# Patient Record
Sex: Female | Born: 1965 | Race: White | Hispanic: No | Smoking: Current every day smoker
Health system: Southern US, Community
[De-identification: ages and names within clinical notes are randomized; demographics above are authoritative.]

## PROBLEM LIST (undated history)

## (undated) DIAGNOSIS — I2510 Atherosclerotic heart disease of native coronary artery without angina pectoris: Secondary | ICD-10-CM

## (undated) DIAGNOSIS — G8929 Other chronic pain: Secondary | ICD-10-CM

## (undated) DIAGNOSIS — J449 Chronic obstructive pulmonary disease, unspecified: Secondary | ICD-10-CM

## (undated) DIAGNOSIS — G4730 Sleep apnea, unspecified: Secondary | ICD-10-CM

## (undated) DIAGNOSIS — E119 Type 2 diabetes mellitus without complications: Secondary | ICD-10-CM

## (undated) DIAGNOSIS — T7840XA Allergy, unspecified, initial encounter: Secondary | ICD-10-CM

## (undated) DIAGNOSIS — H269 Unspecified cataract: Secondary | ICD-10-CM

## (undated) HISTORY — DX: Atherosclerotic heart disease of native coronary artery without angina pectoris: I25.10

## (undated) HISTORY — DX: Unspecified cataract: H26.9

## (undated) HISTORY — DX: Sleep apnea, unspecified: G47.30

## (undated) HISTORY — PX: DG GALL BLADDER: HXRAD326

## (undated) HISTORY — DX: Allergy, unspecified, initial encounter: T78.40XA

## (undated) HISTORY — DX: Other chronic pain: G89.29

## (undated) HISTORY — DX: Chronic obstructive pulmonary disease, unspecified: J44.9

## (undated) HISTORY — DX: Type 2 diabetes mellitus without complications: E11.9

## (undated) HISTORY — PX: CHOLECYSTECTOMY: SHX55

## (undated) HISTORY — PX: TUBAL LIGATION: SHX77

---

## 2014-12-24 DIAGNOSIS — R5383 Other fatigue: Secondary | ICD-10-CM

## 2014-12-24 DIAGNOSIS — R42 Dizziness and giddiness: Secondary | ICD-10-CM | POA: Insufficient documentation

## 2014-12-24 DIAGNOSIS — R079 Chest pain, unspecified: Secondary | ICD-10-CM | POA: Insufficient documentation

## 2014-12-24 HISTORY — DX: Other fatigue: R53.83

## 2017-08-09 DIAGNOSIS — N840 Polyp of corpus uteri: Secondary | ICD-10-CM

## 2017-08-09 DIAGNOSIS — T7840XA Allergy, unspecified, initial encounter: Secondary | ICD-10-CM | POA: Insufficient documentation

## 2017-08-09 HISTORY — DX: Polyp of corpus uteri: N84.0

## 2018-06-17 DIAGNOSIS — F17200 Nicotine dependence, unspecified, uncomplicated: Secondary | ICD-10-CM | POA: Insufficient documentation

## 2021-10-05 DIAGNOSIS — Z8349 Family history of other endocrine, nutritional and metabolic diseases: Secondary | ICD-10-CM | POA: Insufficient documentation

## 2021-10-05 DIAGNOSIS — R221 Localized swelling, mass and lump, neck: Secondary | ICD-10-CM | POA: Insufficient documentation

## 2021-10-05 HISTORY — DX: Localized swelling, mass and lump, neck: R22.1

## 2021-10-05 HISTORY — DX: Family history of other endocrine, nutritional and metabolic diseases: Z83.49

## 2021-11-10 ENCOUNTER — Encounter: Payer: Self-pay | Admitting: Family Medicine

## 2021-11-10 ENCOUNTER — Ambulatory Visit (INDEPENDENT_AMBULATORY_CARE_PROVIDER_SITE_OTHER): Payer: Commercial Managed Care - HMO | Admitting: Family Medicine

## 2021-11-10 VITALS — BP 136/79 | HR 85 | Temp 97.5°F | Ht 64.0 in | Wt 282.8 lb

## 2021-11-10 DIAGNOSIS — R600 Localized edema: Secondary | ICD-10-CM

## 2021-11-10 DIAGNOSIS — R221 Localized swelling, mass and lump, neck: Secondary | ICD-10-CM

## 2021-11-10 DIAGNOSIS — Z720 Tobacco use: Secondary | ICD-10-CM | POA: Insufficient documentation

## 2021-11-10 DIAGNOSIS — J329 Chronic sinusitis, unspecified: Secondary | ICD-10-CM

## 2021-11-10 DIAGNOSIS — Z8349 Family history of other endocrine, nutritional and metabolic diseases: Secondary | ICD-10-CM

## 2021-11-10 HISTORY — DX: Chronic sinusitis, unspecified: J32.9

## 2021-11-10 HISTORY — DX: Morbid (severe) obesity due to excess calories: E66.01

## 2021-11-10 HISTORY — DX: Tobacco use: Z72.0

## 2021-11-10 HISTORY — DX: Localized edema: R60.0

## 2021-11-10 MED ORDER — IPRATROPIUM BROMIDE 0.06 % NA SOLN: 2.0000 | Freq: Two times a day (BID) | NASAL | 2 refills | Status: DC

## 2021-11-10 NOTE — Assessment & Plan Note (Addendum)
Multiple rounds of antibiotics ?Increased risk based on smoking ?Counseled on smoking cessation ?Ipratropium refilled ?For ENT ?

## 2021-11-10 NOTE — Assessment & Plan Note (Signed)
Recommend cessation and counseled on harms of smoking ?

## 2021-11-10 NOTE — Patient Instructions (Addendum)
If you develop chest pain, or severe shortness of breath, go ED ?

## 2021-11-10 NOTE — Assessment & Plan Note (Signed)
Persistent despite multiple imaging was negative for malignant process as explained above ?Some intermittent odynophagia ?History of smoking, increases risk for head and neck cancers ?Referral to ENT to assess further ?

## 2021-11-10 NOTE — Assessment & Plan Note (Signed)
Counseled on diet and exercise ?Recommend restratification labs at follow-up in 2 weeks including CMP, lipid, hemoglobin A1c ?

## 2021-11-10 NOTE — Assessment & Plan Note (Signed)
Intermittent ?No chest pain or shortness of breath ?Sounds like possible VSI with dependent edema ?Although less likely, however cannot rule out further causes of heart failure, bilateral DVT although Wells score low, kidney disease, liver disease ?Possibly worse due to obesity and smoking ?Needs full work-up that we cannot get to today including CBC, CMP, urinalysis, chest x-ray, EKG, lower extremity Dopplers, can also consider echocardiogram ?Return and ED precautions discussed ?

## 2021-11-10 NOTE — Assessment & Plan Note (Signed)
Recent TSH within normal limits and neck ultrasound showed only benign thyroid nodule not likely to cause thyroid disease ? ?

## 2021-11-10 NOTE — Progress Notes (Addendum)
? ?New Patient Office Visit ? ?Subjective   ? ?Patient ID: Theresa Marquez, female    DOB: [birth year]  Age: 56 y.o. MRN: [id number] ? ?CC:  ?Chief Complaint  ?Patient presents with  ? Establish Care  ?  Np est care pt c/o massive swelling everywhere  ? ? ?HPI ?Theresa Marquez presents to establish care.  ? ?Recurrent sinusitis and ear infections. Past 3-4 years. Has tried Augmentin and saw outside provider 09/2021, got doxycyline. No improvennt after finish the antiboitics. No improvement w/ flonase, improved with ipratorpium  ? ?Felt a left neck nodule for past 6 months, 09/2020, difficult to swallow w/ pain. had neck ultrasound that showed benign thyroid nodule, TSH was wnl, Had maxofacial CT 2021, that did not show any nasal or oropharyngeal abnormalities. CT head/neck in 2019, pharyngitis but no other neck abnormalities.  ? ?When falling asleep, has facial tick, ongoing for several years. Denies any tremors or difficulty walking or falls.  ? ?Intermittent lower extremity swelling for 5 years, mostly in legs if on her feet, intermittant, can happen every 2 weeks to a month,  improved when off her feet, reports worse w/ weight loss over this time, No chest pain or shortness of breath, associated with leg pain with the edema,  ? ?Smoking, 0.5-1 pack for 30+ years,  ? ? ?Outpatient Encounter Medications as of 11/10/2021  ?Medication Sig  ? cetirizine (ZYRTEC) 10 MG tablet Take by mouth.  ? [DISCONTINUED] ipratropium (ATROVENT) 0.06 % nasal spray Place 2 sprays into both nostrils 2 (two) times daily.  ? ipratropium (ATROVENT) 0.06 % nasal spray Place 2 sprays into both nostrils 2 (two) times daily.  ? ?No facility-administered encounter medications on file as of 11/10/2021.  ? ? ?Past Medical History:  ?Diagnosis Date  ? Allergy   ? Endometrial polyp 08/09/2017  ? Formatting of this note might be different from the original. S/p hysteroscopy with [facility] 10/2017 with Dr Doreene Nest, was benign  ? ? ?Past Surgical History:   ?Procedure Laterality Date  ? DG GALL BLADDER Right   ? ? ?Family History  ?Problem Relation Age of Onset  ? Kidney disease Mother   ? Cancer Father   ? Cerebral palsy Daughter   ? ? ?Social History  ? ?Socioeconomic History  ? Marital status: Divorced  ?  Spouse name: Not on file  ? Number of children: Not on file  ? Years of education: Not on file  ? Highest education level: Not on file  ?Occupational History  ? Not on file  ?Tobacco Use  ? Smoking status: Every Day  ?  Packs/day: 1.00  ?  Years: 30.00  ?  Pack years: 30.00  ?  Types: Cigarettes  ?  Start date: 10/26/1969  ? Smokeless tobacco: Never  ?Vaping Use  ? Vaping Use: Never used  ?Substance and Sexual Activity  ? Alcohol use: Not Currently  ? Drug use: Never  ? Sexual activity: Not Currently  ?Other Topics Concern  ? Not on file  ?Social History Narrative  ? Not on file  ? ?Social Determinants of Health  ? ?Financial Resource Strain: Not on file  ?Food Insecurity: Not on file  ?Transportation Needs: Not on file  ?Physical Activity: Not on file  ?Stress: Not on file  ?Social Connections: Not on file  ?Intimate Partner Violence: Not on file  ? ? ?ROS ?As per HPI ?  ? ? ?Objective   ? ?BP 136/79 (BP Location: Left Arm, Patient Position:  Sitting, Cuff Size: Large)   Pulse 85   Temp (!) 97.5 ?F (36.4 ?C) (Temporal)   Ht 5\' 4"  (1.626 m)   Wt 282 lb 12.8 oz (128.3 kg)   SpO2 91%   BMI 48.54 kg/m?  ? ?Gen: NAD, resting comfortably ?HEENT: TM bilateral normal, normal TMJ, no neck masses palpated, normal oropharyngeal pharynx, MMM ?CV: RRR with no murmurs appreciated ?Pulm: NWOB, CTAB with no crackles, wheezes, or rhonchi ?GI: Normal bowel sounds present. Soft, Nontender, Nondistended. ?MSK: Trace to +1 bilaterally to mid shin edema,  ?Skin: warm, dry ?Neuro: grossly normal, moves all extremities ?Psych: Normal affect and thought content ? ? ? ?  ? ?Assessment & Plan:  ? ?Problem List Items Addressed This Visit   ? ?  ? Respiratory  ? Recurrent sinus  infections - Primary  ?  Multiple rounds of antibiotics ?Increased risk based on smoking ?Counseled on smoking cessation ?Ipratropium refilled ?For ENT ? ?  ?  ? Relevant Medications  ? cetirizine (ZYRTEC) 10 MG tablet  ? ipratropium (ATROVENT) 0.06 % nasal spray  ? Other Relevant Orders  ? Ambulatory referral to ENT  ?  ? Other  ? Family history of thyroid disease  ?  Recent TSH within normal limits and neck ultrasound showed only benign thyroid nodule not likely to cause thyroid disease ? ? ?  ?  ? Neck swelling  ?  Persistent despite multiple imaging was negative for malignant process as explained above ?Some intermittent odynophagia ?History of smoking, increases risk for head and neck cancers ?Referral to ENT to assess further ? ?  ?  ? Relevant Orders  ? Ambulatory referral to ENT  ? Obesity, morbid, BMI 40.0-49.9 ([place])  ?  Counseled on diet and exercise ?Recommend restratification labs at follow-up in 2 weeks including CMP, lipid, hemoglobin A1c ? ?  ?  ? Tobacco abuse  ?  Recommend cessation and counseled on harms of smoking ? ?  ?  ? Bilateral lower extremity edema  ?  Intermittent ?No chest pain or shortness of breath ?Sounds like possible VSI with dependent edema ?Although less likely, however cannot rule out further causes of heart failure, bilateral DVT although Wells score low, kidney disease, liver disease ?Possibly worse due to obesity and smoking ?Needs full work-up that we cannot get to today including CBC, CMP, urinalysis, chest x-ray, EKG, lower extremity Dopplers, can also consider echocardiogram ?Return and ED precautions discussed ? ?  ?  ? ? ?Return in about 2 weeks (around 11/24/2021) for swelling .  ? ?Bonnita Hollow, MD ? ? ?

## 2021-11-30 ENCOUNTER — Ambulatory Visit (INDEPENDENT_AMBULATORY_CARE_PROVIDER_SITE_OTHER): Payer: Commercial Managed Care - HMO

## 2021-11-30 ENCOUNTER — Encounter: Payer: Self-pay | Admitting: Family Medicine

## 2021-11-30 ENCOUNTER — Ambulatory Visit (INDEPENDENT_AMBULATORY_CARE_PROVIDER_SITE_OTHER): Payer: Commercial Managed Care - HMO | Admitting: Family Medicine

## 2021-11-30 ENCOUNTER — Telehealth: Payer: Self-pay | Admitting: Family Medicine

## 2021-11-30 VITALS — BP 129/74 | HR 81 | Temp 97.2°F | Ht 64.0 in | Wt 280.6 lb

## 2021-11-30 DIAGNOSIS — R600 Localized edema: Secondary | ICD-10-CM

## 2021-11-30 DIAGNOSIS — J329 Chronic sinusitis, unspecified: Secondary | ICD-10-CM | POA: Diagnosis not present

## 2021-11-30 DIAGNOSIS — J309 Allergic rhinitis, unspecified: Secondary | ICD-10-CM | POA: Diagnosis not present

## 2021-11-30 DIAGNOSIS — J310 Chronic rhinitis: Secondary | ICD-10-CM

## 2021-11-30 DIAGNOSIS — Z23 Encounter for immunization: Secondary | ICD-10-CM | POA: Diagnosis not present

## 2021-11-30 DIAGNOSIS — R0600 Dyspnea, unspecified: Secondary | ICD-10-CM | POA: Diagnosis not present

## 2021-11-30 HISTORY — DX: Chronic rhinitis: J31.0

## 2021-11-30 LAB — COMPREHENSIVE METABOLIC PANEL
ALT: 15 U/L (ref 0–35)
AST: 12 U/L (ref 0–37)
Albumin: 3.5 g/dL (ref 3.5–5.2)
Alkaline Phosphatase: 91 U/L (ref 39–117)
BUN: 8 mg/dL (ref 6–23)
CO2: 29 mEq/L (ref 19–32)
Calcium: 9.2 mg/dL (ref 8.4–10.5)
Chloride: 104 mEq/L (ref 96–112)
Creatinine, Ser: 0.72 mg/dL (ref 0.40–1.20)
GFR: 93.55 mL/min (ref >60.00)
Glucose, Bld: 89 mg/dL (ref 70–99)
Potassium: 3.8 mEq/L (ref 3.5–5.1)
Sodium: 139 mEq/L (ref 135–145)
Total Bilirubin: 0.4 mg/dL (ref 0.2–1.2)
Total Protein: 7.1 g/dL (ref 6.0–8.3)

## 2021-11-30 LAB — CBC WITH DIFFERENTIAL/PLATELET
Basophils Absolute: 0 10*3/uL (ref 0.0–0.1)
Basophils Relative: 0.2 % (ref 0.0–3.0)
Eosinophils Absolute: 0.2 10*3/uL (ref 0.0–0.7)
Eosinophils Relative: 1.5 % (ref 0.0–5.0)
HCT: 41.4 % (ref 36.0–46.0)
Hemoglobin: 13.3 g/dL (ref 12.0–15.0)
Lymphocytes Relative: 22.7 % (ref 12.0–46.0)
Lymphs Abs: 2.5 10*3/uL (ref 0.7–4.0)
MCHC: 32.1 g/dL (ref 30.0–36.0)
MCV: 89.8 fl (ref 78.0–100.0)
Monocytes Absolute: 0.6 10*3/uL (ref 0.1–1.0)
Monocytes Relative: 5.5 % (ref 3.0–12.0)
Neutro Abs: 7.8 10*3/uL — ABNORMAL HIGH (ref 1.4–7.7)
Neutrophils Relative %: 70.1 % (ref 43.0–77.0)
Platelets: 312 10*3/uL (ref 150.0–400.0)
RBC: 4.61 Mil/uL (ref 3.87–5.11)
RDW: 15 % (ref 11.5–15.5)
WBC: 11.1 10*3/uL — ABNORMAL HIGH (ref 4.0–10.5)

## 2021-11-30 LAB — POCT URINALYSIS DIPSTICK
Bilirubin, UA: NEGATIVE
Blood, UA: NEGATIVE
Glucose, UA: NEGATIVE
Ketones, UA: NEGATIVE
Leukocytes, UA: NEGATIVE
Nitrite, UA: NEGATIVE
Protein, UA: NEGATIVE
Spec Grav, UA: 1.015 (ref 1.010–1.025)
Urobilinogen, UA: 0.2 E.U./dL
pH, UA: 6 (ref 5.0–8.0)

## 2021-11-30 MED ORDER — IPRATROPIUM BROMIDE 0.06 % NA SOLN: 2.0000 | Freq: Two times a day (BID) | NASAL | 2 refills | Status: DC

## 2021-11-30 NOTE — Assessment & Plan Note (Signed)
Chronic neck and is controlled on ipratropium Refill

## 2021-11-30 NOTE — Progress Notes (Signed)
   Theresa Marquez is a 56 y.o. female who presents today for an office visit.  Assessment/Plan:  New/Acute Problems: none  Chronic Problems Addressed Today: Bilateral lower extremity edema Lower extremity edema Associated with dyspnea and crackles on lung exam Broad differential including anemia, electrolyte disorders, protein balance, heart failure, Wells score 0, PE less likely Normal TSH 1 month ago However I do believe it is most likely related to obesity and smoking Return precautions discussed and go to ED if develop severe chest pain or shortness of breath     Subjective:  HPI:  Patient reports with 5-year history of intermittent lower extremity swelling, has been worse over the past few months.  It is worse when standing on her legs for a long time.  Patient also endorses some shortness of breath, however this is associated with globus pharyngeus.  Denies any difficulty swallowing.  Endorses history of chronic rhinitis, improved on ipratropium.  Patient does have significant tobacco use.       Objective:  Physical Exam: BP 129/74 (BP Location: Right Arm, Patient Position: Sitting, Cuff Size: Large)   Pulse 81   Temp (!) 97.2 F (36.2 C) (Temporal)   Ht 5\' 4"  (1.626 m)   Wt 280 lb 9.6 oz (127.3 kg)   SpO2 96%   BMI 48.16 kg/m   Gen: No acute distress, resting comfortably CV: Regular rate and rhythm with no murmurs appreciated Pulm: Normal work of breathing, right lower crackles in lungs MSK: 2+ pitting edema bilaterally in lower extremities up to shin Abdomen: Nontender, nondistended Neuro: Grossly normal, moves all extremities Psych: Normal affect and thought content      , MD, MS

## 2021-11-30 NOTE — Telephone Encounter (Signed)
Para March from [facility] on [facility] [address] phone # [phone] is needing to know if pt's VAS [place] LOWER EXTREMITY VENOUS (DVT) (Order [id number]) is needing a PA.

## 2021-11-30 NOTE — Assessment & Plan Note (Signed)
Lower extremity edema Associated with dyspnea and crackles on lung exam Broad differential including anemia, electrolyte disorders, protein balance, heart failure, Wells score 0, PE less likely Normal TSH 1 month ago However I do believe it is most likely related to obesity and smoking Return precautions discussed and go to ED if develop severe chest pain or shortness of breath

## 2021-12-01 ENCOUNTER — Other Ambulatory Visit: Payer: Self-pay

## 2021-12-01 ENCOUNTER — Encounter (HOSPITAL_COMMUNITY): Payer: Self-pay | Admitting: Emergency Medicine

## 2021-12-01 ENCOUNTER — Inpatient Hospital Stay (HOSPITAL_COMMUNITY)
Admission: EM | Admit: 2021-12-01 | Discharge: 2021-12-07 | DRG: 292 | Disposition: A | Discharge status: Home / Self Care | Payer: Commercial Managed Care - HMO | Attending: Internal Medicine | Admitting: Internal Medicine

## 2021-12-01 ENCOUNTER — Emergency Department (HOSPITAL_COMMUNITY): Payer: Commercial Managed Care - HMO

## 2021-12-01 ENCOUNTER — Ambulatory Visit (HOSPITAL_BASED_OUTPATIENT_CLINIC_OR_DEPARTMENT_OTHER)
Admission: RE | Admit: 2021-12-01 | Discharge: 2021-12-01 | Disposition: A | Discharge status: Home / Self Care | Payer: Commercial Managed Care - HMO | Source: Ambulatory Visit | Attending: Family Medicine | Admitting: Family Medicine

## 2021-12-01 DIAGNOSIS — R0609 Other forms of dyspnea: Principal | ICD-10-CM

## 2021-12-01 DIAGNOSIS — Z841 Family history of disorders of kidney and ureter: Secondary | ICD-10-CM

## 2021-12-01 DIAGNOSIS — R600 Localized edema: Secondary | ICD-10-CM | POA: Diagnosis not present

## 2021-12-01 DIAGNOSIS — D72829 Elevated white blood cell count, unspecified: Secondary | ICD-10-CM | POA: Diagnosis present

## 2021-12-01 DIAGNOSIS — E8809 Other disorders of plasma-protein metabolism, not elsewhere classified: Secondary | ICD-10-CM | POA: Diagnosis present

## 2021-12-01 DIAGNOSIS — F17210 Nicotine dependence, cigarettes, uncomplicated: Secondary | ICD-10-CM | POA: Diagnosis present

## 2021-12-01 DIAGNOSIS — I509 Heart failure, unspecified: Secondary | ICD-10-CM

## 2021-12-01 DIAGNOSIS — R0600 Dyspnea, unspecified: Secondary | ICD-10-CM | POA: Insufficient documentation

## 2021-12-01 DIAGNOSIS — Z6841 Body Mass Index (BMI) 40.0 and over, adult: Secondary | ICD-10-CM

## 2021-12-01 DIAGNOSIS — I5031 Acute diastolic (congestive) heart failure: Principal | ICD-10-CM | POA: Diagnosis present

## 2021-12-01 DIAGNOSIS — Z809 Family history of malignant neoplasm, unspecified: Secondary | ICD-10-CM

## 2021-12-01 DIAGNOSIS — R739 Hyperglycemia, unspecified: Secondary | ICD-10-CM | POA: Diagnosis present

## 2021-12-01 DIAGNOSIS — Z79899 Other long term (current) drug therapy: Secondary | ICD-10-CM

## 2021-12-01 DIAGNOSIS — J449 Chronic obstructive pulmonary disease, unspecified: Secondary | ICD-10-CM | POA: Diagnosis present

## 2021-12-01 DIAGNOSIS — R7303 Prediabetes: Secondary | ICD-10-CM | POA: Diagnosis present

## 2021-12-01 LAB — CBC WITH DIFFERENTIAL/PLATELET
Abs Immature Granulocytes: 0.04 10*3/uL (ref 0.00–0.07)
Basophils Absolute: 0 10*3/uL (ref 0.0–0.1)
Basophils Relative: 0 %
Eosinophils Absolute: 0.3 10*3/uL (ref 0.0–0.5)
Eosinophils Relative: 2 %
HCT: 44.8 % (ref 36.0–46.0)
Hemoglobin: 13.7 g/dL (ref 12.0–15.0)
Immature Granulocytes: 0 %
Lymphocytes Relative: 22 %
Lymphs Abs: 2.8 10*3/uL (ref 0.7–4.0)
MCH: 29.3 pg (ref 26.0–34.0)
MCHC: 30.6 g/dL (ref 30.0–36.0)
MCV: 95.7 fL (ref 80.0–100.0)
Monocytes Absolute: 0.7 10*3/uL (ref 0.1–1.0)
Monocytes Relative: 6 %
Neutro Abs: 8.9 10*3/uL — ABNORMAL HIGH (ref 1.7–7.7)
Neutrophils Relative %: 70 %
Platelets: 333 10*3/uL (ref 150–400)
RBC: 4.68 MIL/uL (ref 3.87–5.11)
RDW: 14.1 % (ref 11.5–15.5)
WBC: 12.8 10*3/uL — ABNORMAL HIGH (ref 4.0–10.5)
nRBC: 0 % (ref 0.0–0.2)

## 2021-12-01 LAB — COMPREHENSIVE METABOLIC PANEL
ALT: 17 U/L (ref 0–44)
AST: 15 U/L (ref 15–41)
Albumin: 3.1 g/dL — ABNORMAL LOW (ref 3.5–5.0)
Alkaline Phosphatase: 89 U/L (ref 38–126)
Anion gap: 9 (ref 5–15)
BUN: 9 mg/dL (ref 6–20)
CO2: 27 mmol/L (ref 22–32)
Calcium: 9.1 mg/dL (ref 8.9–10.3)
Chloride: 106 mmol/L (ref 98–111)
Creatinine, Ser: 0.84 mg/dL (ref 0.44–1.00)
GFR, Estimated: >60 mL/min (ref >60)
Glucose, Bld: 136 mg/dL — ABNORMAL HIGH (ref 70–99)
Potassium: 3.9 mmol/L (ref 3.5–5.1)
Sodium: 142 mmol/L (ref 135–145)
Total Bilirubin: 0.3 mg/dL (ref 0.3–1.2)
Total Protein: 6.8 g/dL (ref 6.5–8.1)

## 2021-12-01 LAB — BRAIN NATRIURETIC PEPTIDE: B Natriuretic Peptide: 33.3 pg/mL (ref 0.0–100.0)

## 2021-12-01 LAB — TROPONIN I (HIGH SENSITIVITY): Troponin I (High Sensitivity): 6 ng/L (ref <18)

## 2021-12-01 MED ORDER — FUROSEMIDE 10 MG/ML IJ SOLN
20.0000 mg | Freq: Once | INTRAMUSCULAR | Status: AC
  Administered 2021-12-02: 20 mg via INTRAVENOUS
  Filled 2021-12-01: qty 2

## 2021-12-01 MED ORDER — FUROSEMIDE 10 MG/ML IJ SOLN: 40.0000 mg | Freq: Once | INTRAMUSCULAR | Status: DC

## 2021-12-01 NOTE — ED Triage Notes (Signed)
Patient reports worsening SOB and chest tightness today , advised by her PCP to go to ER for evaluation , no cough or fever , presents with bilateral lower legs edema.

## 2021-12-01 NOTE — ED Provider Notes (Signed)
[facility] EMERGENCY DEPARTMENT Provider Note   CSN: [id number] Arrival date & time: 12/01/21  2038     History {Add pertinent medical, surgical, social history, OB history to HPI:1} Chief Complaint  Patient presents with   SOB / Chest Tightness    Theresa Marquez is a 56 y.o. female.  56 year old female without any known past medical history but a approximately 40-pack-year smoking history presents to the ER today with multiple complaints.  They all seem to be related started about a couple weeks ago she started having some nonproductive cough.  No fevers.  She started having dyspnea on exertion around that time as well.  States that this progressively worsened to where start having shortness of breath even at rest and laying down.  She saw her doctor who recommended a DVT ultrasound which was negative and an echocardiogram which has not been done yet.  Chest x-ray done and reportedly showed fluid in her lungs and he heard crackles as well.  She had reported oxygen saturation of 89% in the office.  Reviewed the records it does appear that she was normotensive but the rest of her vitals were unremarkable as well.  This may have been after it was rechecked and is unclear.  Either way, she was told if her shortness of breath got worse start having chest pain to come to the ER.  She states tonight after doing her normal activities started have some chest tightness which is slowly worsened.  States that it is central does not really radiate.  Her breathing seems to be low but worse as well.  Her lower extremity edema has improved reportedly.       Home Medications Prior to Admission medications   Medication Sig Start Date End Date Taking? Authorizing Provider  cetirizine (ZYRTEC) 10 MG tablet Take by mouth. 10/05/21   [provider]  ipratropium (ATROVENT) 0.06 % nasal spray Place 2 sprays into both nostrils 2 (two) times daily. 11/30/21 02/28/22  Garnette Gunner, MD       Allergies    Codeine    Review of Systems   Review of Systems  Physical Exam Updated Vital Signs BP (!) 170/95 (BP Location: Right Arm)   Pulse 92   Temp 98.4 F (36.9 C) (Oral)   Resp 20   SpO2 96%  Physical Exam Vitals and nursing note reviewed.  Constitutional:      Appearance: She is well-developed.  HENT:     Head: Normocephalic and atraumatic.     Nose: No congestion or rhinorrhea.     Mouth/Throat:     Mouth: Mucous membranes are dry.  Eyes:     Pupils: Pupils are equal, round, and reactive to light.  Cardiovascular:     Rate and Rhythm: Normal rate and regular rhythm.  Pulmonary:     Effort: No respiratory distress.     Breath sounds: No stridor. Wheezing and rales present.  Abdominal:     General: Abdomen is flat. There is no distension.  Musculoskeletal:        General: Swelling present. Normal range of motion.     Cervical back: Normal range of motion.  Skin:    General: Skin is warm and dry.  Neurological:     General: No focal deficit present.     Mental Status: She is alert.    ED Results / Procedures / Treatments   Labs (all labs ordered are listed, but only abnormal results are displayed) Labs  Reviewed  CBC WITH DIFFERENTIAL/PLATELET - Abnormal; Notable for the following components:      Result Value   WBC 12.8 (*)    Neutro Abs 8.9 (*)    All other components within normal limits  COMPREHENSIVE METABOLIC PANEL - Abnormal; Notable for the following components:   Glucose, Bld 136 (*)    Albumin 3.1 (*)    All other components within normal limits  BRAIN NATRIURETIC PEPTIDE  TROPONIN I (HIGH SENSITIVITY)  TROPONIN I (HIGH SENSITIVITY)    EKG None  Radiology DG Chest 2 View  Result Date: 12/01/2021 CLINICAL DATA:  Shortness of breath. EXAM: CHEST - 2 VIEW COMPARISON:  11/30/2021. FINDINGS: Heart is enlarged and the mediastinal contour is stable. The pulmonary vasculature is mildly distended. Interstitial prominence is noted  bilaterally. No consolidation, effusion, or pneumothorax. Mild degenerative changes are present in the thoracic spine. IMPRESSION: 1. Cardiomegaly with pulmonary vascular congestion. 2. Interstitial prominence bilaterally possible edema or infiltrate. Electronically Signed   By: Thornell Sartorius M.D.   On: 12/01/2021 21:31   DG Chest 2 View  Result Date: 12/01/2021 CLINICAL DATA:  Dyspnea. Hypoxia. Lower extremity edema. EXAM: CHEST - 2 VIEW COMPARISON:  03/20/2015 FINDINGS: Mild cardiomegaly appears increased since previous study. Increased pulmonary vascular congestion is also seen, without evidence of frank pulmonary edema. No evidence of pulmonary consolidation or pleural effusion. IMPRESSION: Increased cardiomegaly and pulmonary vascular congestion. No focal consolidation or pleural effusion. Electronically Signed   By: Danae Orleans M.D.   On: 12/01/2021 08:12   VAS [place] LOWER EXTREMITY VENOUS (DVT)  Result Date: 12/01/2021  Lower Venous DVT Study Patient Name:  Theresa Marquez  Date of Exam:   12/01/2021 Medical Rec #: [id number]        Accession #:    [id number] Date of Birth: 10-04-1965        Patient Gender: F Patient Age:   65 years Exam Location:  [facility] Procedure:      VAS [place] LOWER EXTREMITY VENOUS (DVT) Referring Phys: Fanny Bien --------------------------------------------------------------------------------  Indications: History of lower extremity swelling for several months. Bilateral lower extremity persistent edema and redness x 2 weeks. Patient c/o dyspnea on exertion and with coughing x 2 weeks. Patient reports recent diagnosis of an enlarged heart.  Comparison Study: NA Performing Technologist: Tyna Jaksch RVT  Examination Guidelines: A complete evaluation includes B-mode imaging, spectral Doppler, color Doppler, and power Doppler as needed of all accessible portions of each vessel. Bilateral testing is considered an integral part of a complete examination. Limited examinations for  reoccurring indications may be performed as noted. The reflux portion of the exam is performed with the patient in reverse Trendelenburg.  +---------+---------------+---------+-----------+----------+--------------+ RIGHT    CompressibilityPhasicitySpontaneityPropertiesThrombus Aging +---------+---------------+---------+-----------+----------+--------------+ CFV      Full                    Yes                                 +---------+---------------+---------+-----------+----------+--------------+ SFJ      Full                    Yes                                 +---------+---------------+---------+-----------+----------+--------------+ FV Prox  Full  Yes                                 +---------+---------------+---------+-----------+----------+--------------+ FV Mid   Full                                                        +---------+---------------+---------+-----------+----------+--------------+ FV DistalFull                    Yes                                 +---------+---------------+---------+-----------+----------+--------------+ PFV      Full                    Yes                                 +---------+---------------+---------+-----------+----------+--------------+ POP      Full                    Yes                                 +---------+---------------+---------+-----------+----------+--------------+ PTV      Full                                                        +---------+---------------+---------+-----------+----------+--------------+ PERO     Full                                                        +---------+---------------+---------+-----------+----------+--------------+ Gastroc  Full                                                        +---------+---------------+---------+-----------+----------+--------------+ GSV      Full                    Yes                                  +---------+---------------+---------+-----------+----------+--------------+   Right Technical Findings: Mild pulsatile venous flow noted throughout the lower extremity.  +---------+---------------+---------+-----------+----------+--------------+ LEFT     CompressibilityPhasicitySpontaneityPropertiesThrombus Aging +---------+---------------+---------+-----------+----------+--------------+ CFV      Full                    Yes                                 +---------+---------------+---------+-----------+----------+--------------+ SFJ  Full                    Yes                                 +---------+---------------+---------+-----------+----------+--------------+ FV Prox  Full                    Yes                                 +---------+---------------+---------+-----------+----------+--------------+ FV Mid   Full                                                        +---------+---------------+---------+-----------+----------+--------------+ FV DistalFull                    Yes                                 +---------+---------------+---------+-----------+----------+--------------+ PFV      Full                    Yes                                 +---------+---------------+---------+-----------+----------+--------------+ POP      Full                    Yes                                 +---------+---------------+---------+-----------+----------+--------------+ PTV      Full                                                        +---------+---------------+---------+-----------+----------+--------------+ PERO     Full                                                        +---------+---------------+---------+-----------+----------+--------------+ Gastroc  Full                                                        +---------+---------------+---------+-----------+----------+--------------+ GSV      Full                     Yes                                 +---------+---------------+---------+-----------+----------+--------------+   Left Technical Findings: Mild pulsatile venous  flow noted throughout the lower extremity.   Summary: BILATERAL: - No evidence of deep vein thrombosis seen in the lower extremities, bilaterally. - No evidence of superficial venous thrombosis in the lower extremities, bilaterally. -No evidence of popliteal cyst, bilaterally.  - Mild pulsatile venous flow noted throughout the bilateral lower extremities. Pulsatile lower limb venous Doppler waveform correlates well with increase right atrium pressure; right-sided heart failure.  *See table(s) above for measurements and observations. Electronically signed by Charlton Haws MD on 12/01/2021 at 3:09:09 PM.    Final     Procedures Procedures  {Document cardiac monitor, telemetry assessment procedure when appropriate:1}  Medications Ordered in ED Medications  furosemide (LASIX) injection 20 mg (has no administration in time range)    ED Course/ Medical Decision Making/ A&P                           Medical Decision Making Risk Prescription drug management.  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  Mild hypoxia with [place] also tough to tell she might have COPD with a long smoking history.  She has new onset elevated blood pressure but no documented high blood pressure in the past and she states that she takes it often without blood pressure so is unclear what etiology of her heart failure might be.  She may have had a cardiac event couple weeks ago and this all started.  Could be myocarditis could be multiple other etiologies.  Will initiate Lasix.  Given nitro for blood pressure and chest pain.  We will see her check a troponin is and how she responds but will quite possibly need to be admitted to the hospital for further work-up in the setting of unclear etiology.. ***  {Document critical care time when  appropriate:1} {Document review of labs and clinical decision tools ie heart score, Chads2Vasc2 etc:1}  {Document your independent review of radiology images, and any outside records:1} {Document your discussion with family members, caretakers, and with consultants:1} {Document social determinants of health affecting pt's care:1} {Document your decision making why or why not admission, treatments were needed:1} Final Clinical Impression(s) / ED Diagnoses Final diagnoses:  None    Rx / DC Orders ED Discharge Orders     None

## 2021-12-01 NOTE — ED Provider Triage Note (Signed)
Emergency Medicine Provider Triage Evaluation Note  Theresa Marquez , a 56 y.o. female  was evaluated in triage.  Pt complains of shortness of breath.  Patient states that she has had bilateral lower extremity intermittent swelling over the past 5 months if she was on her feet too much during the day but has gotten worse recently.  Her lower extremity swelling is now persistent. She has had persistent shortness of breath with cough during that time.  This is worsened over the past week.  Shortness of breath is now with exertion as well as during coughing fits.  She also describes feelings of chest tightness that is worsened with excessive coughing.  She was recently establish care with a PCP who ordered a DVT study on her lower extremities which was negative today.  She denies fever, chills, night sweats, abdominal pain, nausea/vomiting, diarrhea, urinary/vaginal symptoms.  No history of heart failure but her PCP mention the possibility of heart failure yesterday.  Review of Systems  Positive: See above Negative:   Physical Exam  BP (!) 170/95 (BP Location: Right Arm)   Pulse 92   Temp 98.4 F (36.9 C) (Oral)   Resp 20   SpO2 96%  Gen:   Awake, no distress   Resp:  Normal effort  MSK:   Moves extremities without difficulty.  2+ pitting edema noted in bilateral lower extremities. Other:  Bilateral rails heard in middle lobe upon auscultation of lungs.  Medical Decision Making  Medically screening exam initiated at 9:02 PM.  Appropriate orders placed.  Theresa Marquez was informed that the remainder of the evaluation will be completed by another provider, this initial triage assessment does not replace that evaluation, and the importance of remaining in the ED until their evaluation is complete.     Peter Garter, [place] 12/01/21 2106

## 2021-12-02 ENCOUNTER — Encounter (HOSPITAL_COMMUNITY): Payer: Self-pay | Admitting: Internal Medicine

## 2021-12-02 ENCOUNTER — Observation Stay (HOSPITAL_COMMUNITY): Payer: Commercial Managed Care - HMO

## 2021-12-02 ENCOUNTER — Other Ambulatory Visit (HOSPITAL_COMMUNITY): Payer: Self-pay

## 2021-12-02 DIAGNOSIS — I509 Heart failure, unspecified: Secondary | ICD-10-CM | POA: Diagnosis not present

## 2021-12-02 DIAGNOSIS — R079 Chest pain, unspecified: Secondary | ICD-10-CM | POA: Diagnosis not present

## 2021-12-02 LAB — COMPREHENSIVE METABOLIC PANEL
ALT: 17 U/L (ref 0–44)
AST: 15 U/L (ref 15–41)
Albumin: 3.2 g/dL — ABNORMAL LOW (ref 3.5–5.0)
Alkaline Phosphatase: 86 U/L (ref 38–126)
Anion gap: 10 (ref 5–15)
BUN: 9 mg/dL (ref 6–20)
CO2: 26 mmol/L (ref 22–32)
Calcium: 8.8 mg/dL — ABNORMAL LOW (ref 8.9–10.3)
Chloride: 102 mmol/L (ref 98–111)
Creatinine, Ser: 0.72 mg/dL (ref 0.44–1.00)
GFR, Estimated: >60 mL/min (ref >60)
Glucose, Bld: 111 mg/dL — ABNORMAL HIGH (ref 70–99)
Potassium: 3.8 mmol/L (ref 3.5–5.1)
Sodium: 138 mmol/L (ref 135–145)
Total Bilirubin: 0.4 mg/dL (ref 0.3–1.2)
Total Protein: 6.9 g/dL (ref 6.5–8.1)

## 2021-12-02 LAB — CBC WITH DIFFERENTIAL/PLATELET
Abs Immature Granulocytes: 0.06 10*3/uL (ref 0.00–0.07)
Basophils Absolute: 0.1 10*3/uL (ref 0.0–0.1)
Basophils Relative: 0 %
Eosinophils Absolute: 0.3 10*3/uL (ref 0.0–0.5)
Eosinophils Relative: 3 %
HCT: 45.7 % (ref 36.0–46.0)
Hemoglobin: 14.5 g/dL (ref 12.0–15.0)
Immature Granulocytes: 1 %
Lymphocytes Relative: 18 %
Lymphs Abs: 2.2 10*3/uL (ref 0.7–4.0)
MCH: 29.8 pg (ref 26.0–34.0)
MCHC: 31.7 g/dL (ref 30.0–36.0)
MCV: 93.8 fL (ref 80.0–100.0)
Monocytes Absolute: 0.8 10*3/uL (ref 0.1–1.0)
Monocytes Relative: 7 %
Neutro Abs: 8.8 10*3/uL — ABNORMAL HIGH (ref 1.7–7.7)
Neutrophils Relative %: 71 %
Platelets: 304 10*3/uL (ref 150–400)
RBC: 4.87 MIL/uL (ref 3.87–5.11)
RDW: 14.3 % (ref 11.5–15.5)
WBC: 12.2 10*3/uL — ABNORMAL HIGH (ref 4.0–10.5)
nRBC: 0 % (ref 0.0–0.2)

## 2021-12-02 LAB — ECHOCARDIOGRAM COMPLETE
AV Peak grad: 14.1 mmHg
Ao pk vel: 1.88 m/s
Area-P 1/2: 4.06 cm2
S' Lateral: 5.1 cm

## 2021-12-02 LAB — LIPID PANEL
Cholesterol: 179 mg/dL (ref 0–200)
HDL: 40 mg/dL — ABNORMAL LOW (ref >40)
LDL Cholesterol: 126 mg/dL — ABNORMAL HIGH (ref 0–99)
Total CHOL/HDL Ratio: 4.5 RATIO
Triglycerides: 63 mg/dL (ref <150)
VLDL: 13 mg/dL (ref 0–40)

## 2021-12-02 LAB — TROPONIN I (HIGH SENSITIVITY): Troponin I (High Sensitivity): 6 ng/L (ref <18)

## 2021-12-02 LAB — HIV ANTIBODY (ROUTINE TESTING W REFLEX): HIV Screen 4th Generation wRfx: NONREACTIVE

## 2021-12-02 LAB — TSH: TSH: 1.914 u[IU]/mL (ref 0.350–4.500)

## 2021-12-02 LAB — MAGNESIUM: Magnesium: 2 mg/dL (ref 1.7–2.4)

## 2021-12-02 LAB — HEMOGLOBIN A1C
Hgb A1c MFr Bld: 6.2 % — ABNORMAL HIGH (ref 4.8–5.6)
Mean Plasma Glucose: 131.24 mg/dL

## 2021-12-02 LAB — PHOSPHORUS: Phosphorus: 4.7 mg/dL — ABNORMAL HIGH (ref 2.5–4.6)

## 2021-12-02 MED ORDER — FUROSEMIDE 10 MG/ML IJ SOLN
40.0000 mg | Freq: Two times a day (BID) | INTRAMUSCULAR | Status: DC
  Administered 2021-12-02: 40 mg via INTRAVENOUS
  Filled 2021-12-02: qty 4

## 2021-12-02 MED ORDER — IPRATROPIUM-ALBUTEROL 0.5-2.5 (3) MG/3ML IN SOLN
3.0000 mL | Freq: Four times a day (QID) | RESPIRATORY_TRACT | Status: DC
  Administered 2021-12-02 – 2021-12-07 (×13): 3 mL via RESPIRATORY_TRACT
  Filled 2021-12-02 (×14): qty 3

## 2021-12-02 MED ORDER — MELATONIN 3 MG PO TABS: 3.0000 mg | ORAL_TABLET | Freq: Every evening | ORAL | Status: DC | PRN

## 2021-12-02 MED ORDER — DAPAGLIFLOZIN PROPANEDIOL 10 MG PO TABS
10.0000 mg | ORAL_TABLET | Freq: Every day | ORAL | Status: DC
  Administered 2021-12-03 – 2021-12-07 (×5): 10 mg via ORAL
  Filled 2021-12-02 (×5): qty 1

## 2021-12-02 MED ORDER — ACETAMINOPHEN 325 MG PO TABS: 650.0000 mg | ORAL_TABLET | Freq: Four times a day (QID) | ORAL | Status: DC | PRN

## 2021-12-02 MED ORDER — PROCHLORPERAZINE EDISYLATE 10 MG/2ML IJ SOLN: 10.0000 mg | Freq: Four times a day (QID) | INTRAMUSCULAR | Status: DC | PRN

## 2021-12-02 MED ORDER — LORATADINE 10 MG PO TABS
10.0000 mg | ORAL_TABLET | Freq: Every day | ORAL | Status: DC
  Administered 2021-12-02 – 2021-12-07 (×6): 10 mg via ORAL
  Filled 2021-12-02 (×6): qty 1

## 2021-12-02 MED ORDER — NICOTINE 14 MG/24HR TD PT24
14.0000 mg | MEDICATED_PATCH | Freq: Every day | TRANSDERMAL | Status: DC
  Filled 2021-12-02 (×5): qty 1

## 2021-12-02 MED ORDER — POLYETHYLENE GLYCOL 3350 17 G PO PACK: 17.0000 g | PACK | Freq: Every day | ORAL | Status: DC | PRN

## 2021-12-02 MED ORDER — NITROGLYCERIN 0.4 MG SL SUBL: 0.4000 mg | SUBLINGUAL_TABLET | SUBLINGUAL | Status: DC | PRN

## 2021-12-02 MED ORDER — ENOXAPARIN SODIUM 40 MG/0.4ML IJ SOSY
40.0000 mg | PREFILLED_SYRINGE | INTRAMUSCULAR | Status: DC
Start: 2021-12-02 — End: 2021-12-03
  Administered 2021-12-02: 40 mg via SUBCUTANEOUS
  Filled 2021-12-02: qty 0.4

## 2021-12-02 NOTE — ED Notes (Signed)
Pt ambulated to the restroom with a steady gait.

## 2021-12-02 NOTE — H&P (Addendum)
History and Physical  Theresa DolinBethany A Marquez ZOX:[id number] DOB: March 18, 1966 DOA: 12/01/2021  Referring physician:  Dr. Erin Marquez, EDP PCP: Theresa Marquez, Theresa B, MD  Outpatient Specialists: Cardiology Patient coming from: Home  Chief Complaint: Shortness of breath with exertion and chest tightness.  HPI: Theresa Marquez is a 56 y.o. female with medical history significant for severe morbid obesity, bilateral lower extremity edema, tobacco abuse, who [facility] from home with complaints of dyspnea with minimal exertion, gradually worsening for the past 2 weeks.  Associated with nonproductive cough, worsening pitting bilateral lower extremity edema, all the way up to her thighs.  Endorses having chest tightness last night that improved at rest.  Ongoing tobacco user, 1 PPD.  Upon presentation to the ED, chest x-ray revealed cardiomegaly and pulmonary edema.  She received 1 dose of IV Lasix 40 mg.  Due to concern for acute CHF EDP requested admission.  [facility]  ED Course: Tmax 98.4.  BP 157/83, pulse 89, respiration rate 22, O2 saturation 94% on 2 L.  Labs studies significant for serum glucose 136, albumin 3.1.  WBC 12.8, neutrophil count 8.9.  Review of Systems: Review of systems as noted in the HPI. All other systems reviewed and are negative.   Past Medical History:  Diagnosis Date   Allergy    Endometrial polyp 08/09/2017   Formatting of this note might be different from the original. [facility] Elmore Gu[facility]eefe, was benign   Past Surgical History:  Procedure Laterality Date   DG GALL BLADDER Right     Social History:  reports that she has been smoking cigarettes. She started smoking about 52 years ago. She has a 30.00 pack-year smoking history. She has never used smokeless tobacco. She reports that she does not currently use alcohol. She reports that she does not use drugs.   Allergies  Allergen Reactions   Codeine Other  (See Comments)    Hallucinations  Hallucinates     Family History  Problem Relation Age of Onset   Kidney disease Mother    Cancer Father    Cerebral palsy Daughter       Prior to Admission medications   Medication Sig Start Date End Date Taking? Authorizing Provider  cetirizine (ZYRTEC) 10 MG tablet Take by mouth. 10/05/21   [provider]  ipratropium (ATROVENT) 0.06 % nasal spray Place 2 sprays into both nostrils 2 (two) times daily. 11/30/21 02/28/22  Theresa Marquez, Theresa B, MD    Physical Exam: BP (!) 157/83   Pulse 89   Temp 98.4 F (36.9 C) (Oral)   Resp (!) 22   SpO2 94%   General: 56 y.o. year-old female well developed well nourished in no acute distress.  Alert and oriented x3. Cardiovascular: Regular rate and rhythm with no rubs or gallops.  No thyromegaly or JVD noted.  Pitting lower extremity edema up to thighs. Respiratory: Diffuse rales bilaterally.  Poor inspiratory effort. Abdomen: Soft nontender nondistended with normal bowel sounds x4 quadrants. Muskuloskeletal: No cyanosis or clubbing.  Pitting edema noted bilaterally Neuro: CN II-XII intact, strength, sensation, reflexes Skin: No ulcerative lesions noted or rashes Psychiatry: Judgement and insight appear normal. Mood is appropriate for condition and setting          Labs on Admission:  Basic Metabolic Panel: Recent Labs  Lab 11/30/21 1151 12/01/21 2106  NA 139 142  K 3.8 3.9  CL 104 106  CO2 29 27  GLUCOSE 89 136*  BUN 8 9  CREATININE 0.72 0.84  CALCIUM 9.2 9.1   Liver Function Tests: Recent Labs  Lab 11/30/21 1151 12/01/21 2106  AST 12 15  ALT 15 17  ALKPHOS 91 89  BILITOT 0.4 0.3  PROT 7.1 6.8  ALBUMIN 3.5 3.1*   No results for input(s): LIPASE, AMYLASE in the last 168 hours. No results for input(s): AMMONIA in the last 168 hours. CBC: Recent Labs  Lab 11/30/21 1151 12/01/21 2106  WBC 11.1* 12.8*  NEUTROABS 7.8* 8.9*  HGB 13.3 13.7  HCT 41.4 44.8  MCV 89.8 95.7  PLT  312.0 333   Cardiac Enzymes: No results for input(s): CKTOTAL, CKMB, CKMBINDEX, TROPONINI in the last 168 hours.  BNP (last 3 results) Recent Labs    12/01/21 2106  BNP 33.3    ProBNP (last 3 results) No results for input(s): PROBNP in the last 8760 hours.  CBG: No results for input(s): GLUCAP in the last 168 hours.  Radiological Exams on Admission: DG Chest 2 View  Result Date: 12/01/2021 CLINICAL DATA:  Shortness of breath. EXAM: CHEST - 2 VIEW COMPARISON:  11/30/2021. FINDINGS: Heart is enlarged and the mediastinal contour is stable. The pulmonary vasculature is mildly distended. Interstitial prominence is noted bilaterally. No consolidation, effusion, or pneumothorax. Mild degenerative changes are present in the thoracic spine. IMPRESSION: 1. Cardiomegaly with pulmonary vascular congestion. 2. Interstitial prominence bilaterally possible edema or infiltrate. Electronically Signed   By: Theresa Marquez M.D.   On: 12/01/2021 21:31   DG Chest 2 View  Result Date: 12/01/2021 CLINICAL DATA:  Dyspnea. Hypoxia. Lower extremity edema. EXAM: CHEST - 2 VIEW COMPARISON:  03/20/2015 FINDINGS: Mild cardiomegaly appears increased since previous study. Increased pulmonary vascular congestion is also seen, without evidence of frank pulmonary edema. No evidence of pulmonary consolidation or pleural effusion. IMPRESSION: Increased cardiomegaly and pulmonary vascular congestion. No focal consolidation or pleural effusion. Electronically Signed   By: Theresa Marquez M.D.   On: 12/01/2021 08:12   VAS [place] LOWER EXTREMITY VENOUS (DVT)  Result Date: 12/01/2021  Lower Venous DVT Study Patient Name:  Theresa Marquez  Date of Exam:   12/01/2021 Medical Rec #: [id number]        Accession #:    [id number] Date of Birth: [birth year]        Patient Gender: F Patient Age:   57 years Exam Location:  [facility] Procedure:      VAS [place] LOWER EXTREMITY VENOUS (DVT) Referring Phys: Fanny Bien  --------------------------------------------------------------------------------  Indications: History of lower extremity swelling for several months. Bilateral lower extremity persistent edema and redness x 2 weeks. Patient c/o dyspnea on exertion and with coughing x 2 weeks. Patient reports recent diagnosis of an enlarged heart.  Comparison Study: NA Performing Technologist: Tyna Jaksch RVT  Examination Guidelines: A complete evaluation includes B-mode imaging, spectral Doppler, color Doppler, and power Doppler as needed of all accessible portions of each vessel. Bilateral testing is considered an integral part of a complete examination. Limited examinations for reoccurring indications may be performed as noted. The reflux portion of the exam is performed with the patient in reverse Trendelenburg.  +---------+---------------+---------+-----------+----------+--------------+ RIGHT    CompressibilityPhasicitySpontaneityPropertiesThrombus Aging +---------+---------------+---------+-----------+----------+--------------+ CFV      Full                    Yes                                 +---------+---------------+---------+-----------+----------+--------------+  SFJ      Full                    Yes                                 +---------+---------------+---------+-----------+----------+--------------+ FV Prox  Full                    Yes                                 +---------+---------------+---------+-----------+----------+--------------+ FV Mid   Full                                                        +---------+---------------+---------+-----------+----------+--------------+ FV DistalFull                    Yes                                 +---------+---------------+---------+-----------+----------+--------------+ PFV      Full                    Yes                                  +---------+---------------+---------+-----------+----------+--------------+ POP      Full                    Yes                                 +---------+---------------+---------+-----------+----------+--------------+ PTV      Full                                                        +---------+---------------+---------+-----------+----------+--------------+ PERO     Full                                                        +---------+---------------+---------+-----------+----------+--------------+ Gastroc  Full                                                        +---------+---------------+---------+-----------+----------+--------------+ GSV      Full                    Yes                                 +---------+---------------+---------+-----------+----------+--------------+  Right Technical Findings: Mild pulsatile venous flow noted throughout the lower extremity.  +---------+---------------+---------+-----------+----------+--------------+ LEFT     CompressibilityPhasicitySpontaneityPropertiesThrombus Aging +---------+---------------+---------+-----------+----------+--------------+ CFV      Full                    Yes                                 +---------+---------------+---------+-----------+----------+--------------+ SFJ      Full                    Yes                                 +---------+---------------+---------+-----------+----------+--------------+ FV Prox  Full                    Yes                                 +---------+---------------+---------+-----------+----------+--------------+ FV Mid   Full                                                        +---------+---------------+---------+-----------+----------+--------------+ FV DistalFull                    Yes                                 +---------+---------------+---------+-----------+----------+--------------+ PFV      Full                     Yes                                 +---------+---------------+---------+-----------+----------+--------------+ POP      Full                    Yes                                 +---------+---------------+---------+-----------+----------+--------------+ PTV      Full                                                        +---------+---------------+---------+-----------+----------+--------------+ PERO     Full                                                        +---------+---------------+---------+-----------+----------+--------------+ Gastroc  Full                                                        +---------+---------------+---------+-----------+----------+--------------+  GSV      Full                    Yes                                 +---------+---------------+---------+-----------+----------+--------------+   Left Technical Findings: Mild pulsatile venous flow noted throughout the lower extremity.   Summary: BILATERAL: - No evidence of deep vein thrombosis seen in the lower extremities, bilaterally. - No evidence of superficial venous thrombosis in the lower extremities, bilaterally. -No evidence of popliteal cyst, bilaterally.  - Mild pulsatile venous flow noted throughout the bilateral lower extremities. Pulsatile lower limb venous Doppler waveform correlates well with increase right atrium pressure; right-sided heart failure.  *See table(s) above for measurements and observations. Electronically signed by Charlton Haws MD on 12/01/2021 at 3:09:09 PM.    Final     EKG: I independently viewed the EKG done and my findings are as followed: Normal sinus rhythm rate of 94.  Nonspecific ST-T changes.  QTc 447.  Assessment/Plan Present on Admission: **None**  Principal Problem:   Acute CHF (congestive heart failure) (HCC)  Acute CHF/Volume overload No prior history of CHF. Presented with dyspnea with minimal exertion, chest tightness with exertion  improved with rest. Chest x-ray, personally reviewed showing cardiomegaly and increase in pulmonary vascularity suggestive of pulm edema. Peripheral edema on exam Received 1 dose of IV Lasix in the ED, 20 mg IV x1 Obtain 2D echo Obtain TSH Start strict strict I's and O's and daily weight  Chest tightness with concern for angina Endorses chest tightness with exertion and relieved at rest High-sensitivity troponin negative x2 Nonspecific ST T wave abnormality on 12 EKG Obtain fasting lipid panel and A1c Monitor on telemetry Consult cardiology in the morning-May benefit from stress test  Severe morbid obesity BMI 48 Recommend weight loss outpatient with regular physical activity and healthy dieting.  Hyperglycemia Serum sodium 136 No prior history of diabetes Obtain hemoglobin A1c Start insulin sliding scale when appropriate  Leukocytosis Possibly reactive Presented with WBC 12.8K Monitor fever curve and WBC  Tobacco use disorder Tobacco cessation counseling Nicotine patch    DVT prophylaxis: Subcu Lovenox daily  Code Status: Full code  Family Communication: None at bedside  Disposition Plan: Admitted to telemetry cardiac unit  Consults called: None.  Consult cardiology in the AM for possible angina.  Admission status: Observation status.   Status is: Observation    Darlin Drop MD [facility] Pager [phone]  If 7PM-7AM, please contact night-coverage [email or web address] Password [facility]  12/02/2021, 3:57 AM

## 2021-12-02 NOTE — ED Notes (Signed)
Pt sleeping, 02=84%, 2LNC applied.  Dr. Dayna Barker at bedside.

## 2021-12-02 NOTE — ED Notes (Signed)
Patient placed into clean gown.

## 2021-12-02 NOTE — ED Notes (Signed)
Pt ambulated to restroom unassisted. Upon return to room, pulse ox was 94% on room air but pt was visibly short of breath with RR 28 at rest. Vitals otherwise WNL.

## 2021-12-02 NOTE — Progress Notes (Signed)
Heart Failure Stewardship Pharmacist Progress Note   PCP: Garnette Gunner, MD PCP-Cardiologist: None   HPI:   56 y.o. female with medical history significant for severe morbid obesity, bilateral lower extremity edema, tobacco abuse, who presented to [facility] ED from home with complaints of dyspnea with minimal exertion, gradually worsening for the past 2 weeks.  Associated with nonproductive cough, worsening pitting bilateral lower extremity edema, all the way up to her thighs.  Endorses having chest tightness last night that improved at rest.  Ongoing tobacco user, 1 PPD.  Upon presentation to the ED, chest x-ray revealed cardiomegaly and pulmonary edema.  She received 1 dose of IV Lasix 40 mg. 12-lead EKG indicated NSR. BNP 33.3.   Echo on 12/02/2021 revealed EF 55-60% with normal RV function, however study was limited due to poor windows with no prior studies available for comparison.    Current HF Medications: Diuretic: furosemide 20 mg IV x1 ACE/ARB/ARNI: none Aldosterone Antagonist: none SGLT2i: none  Prior to admission HF Medications: None  Pertinent Lab Values: Serum creatinine 0.72, BUN 9, Potassium 3.8, Sodium 138, BNP 33.3, Magnesium 2.0, A1c 6.2%  Vital Signs: Weight: not  yet documented (admission weight: pending lbs) Blood pressure: 115-150/60  Heart rate: 80  I/O: not documented  Medication Assistance / Insurance Benefits Check: Does the patient have prescription insurance?  Yes Type of insurance plan: [employer] loss adjuster   Outpatient Pharmacy:  Prior to admission outpatient pharmacy: [facility] Is the patient willing to use [facility] pharmacy at discharge? No Is the patient willing to transition their outpatient pharmacy to utilize a [facility] outpatient pharmacy?   Pending  Assessment: 1. Acute diastolic CHF (LVEF 55-60%), due to unclear etiology. NYHA class III symptoms. -Patient volume overload per exam. Recommend continuation furosemide 20 mg IV x1  and assess response. Monitor UOP. BNP 33. BNP may be falsely low due to obesity, but not in proportion to exam. Recommend work-up for amyloid, possible viral cardiomyopathy.  -Recommend initiation of spironolactone 25 mg daily based on 2023 HFpEF guidelines. Scr stable. K 3.8 and started on IV diuresis will help maintain K wnl.  -Recommend addition of Farxiga 10 mg daily due to HFpEF based on 2023 HFpEF guidelines and A1c of 6.2%.  -BP labile, but room for initiation of therapy. Recommend initiation of Entresto 24/26 mg BID per 2023 HFpEF guidelines.    Plan: 1) Medication changes recommended at this time: -Continue furosemide 20 mg IV x1 -Start spironolactone 25 mg daily -Start Farxiga 10 mg daily -Start Entresto 24/26 mg BID  2) Patient assistance: Sherryll Burger copay $15 Marcelline Deist copay $15  3)  Education  - To be completed prior to discharge  Drake Leach, PharmD, [facility] PGY2 Cardiology Pharmacy Resident

## 2021-12-02 NOTE — Progress Notes (Addendum)
PROGRESS NOTE    Theresa Marquez  FAO:[id number] DOB: 10/14/1965 DOA: 12/01/2021 PCP: Garnette Gunner, MD  56/F with history of morbid obesity, intermittent edema, tobacco use, COPD presented to the ED with worsening dyspnea on exertion, cough and edema ongoing for several months worse in the last 2 weeks.  Chest x-ray noted cardiomegaly, pulmonary edema.  BNP was 33, troponin was negative, albumin is 3.2   Subjective: Shortness of breath reported with any activity  Assessment and Plan:  Acute diastolic CHF, new onset -Ongoing dyspnea on exertion and swelling for several weeks -2D echo limited by poor acoustic windows, preserved EF, grade 1 diastolic dysfunction, RV function preserved, PA pressures could not be assessed -Continue IV Lasix today, add SGLT2i -Mild hypoalbuminemia, will check SPEP -Blood pressure is starting to be soft, monitor -TSH pending -Heart failure navigator consult, dietitian consult  Suspected sleep apnea -Transient apneic episodes witnessed during my encounter with patient this morning as well -Add CPAP nightly, will send pulmonary referral for sleep study   Chest tightness -Resolved, likely secondary to above, high-sensitivity troponins are negative -Echo with preserved EF, monitor   Morbid obesity BMI 48 -Needs weight loss and lifestyle modification   Hyperglycemia, borderline diabetes -HbA1c is 6.2, SGLT2i added  Leukocytosis -Likely reactive, monitor   Tobacco use disorder Tobacco cessation counseling Nicotine patch     DVT prophylaxis: Subcu Lovenox daily   Code Status: Full code   Family Communication: None at bedside Disposition Plan: Home pending improvement in volume status will be  Consultants:    Procedures:   Antimicrobials:    Objective: Vitals:   12/02/21 0600 12/02/21 0630 12/02/21 0800 12/02/21 1200  BP: 128/61 137/81 128/69 130/78  Pulse: 87 69 94 75  Resp: (!) Temp:      TempSrc:      SpO2: 99%  90% 98% 97%   No intake or output data in the 24 hours ending 12/02/21 1513 There were no vitals filed for this visit.  Examination:  General exam: Chronically ill female sitting up in bed, drowsy but easily arousable HEENT: Could not assess JVD Respiratory system: Fine basilar Rales Cardiovascular system: S1 & S2 heard, RRR.  Abd: nondistended, soft and nontender.Normal bowel sounds heard. Central nervous system: Alert and oriented. No focal neurological deficits. Extremities: 2+ edema Skin: No rashes Psychiatry:  Mood & affect appropriate.     Data Reviewed:   CBC: Recent Labs  Lab 11/30/21 1151 12/01/21 2106 12/02/21 0553  WBC 11.1* 12.8* 12.2*  NEUTROABS 7.8* 8.9* 8.8*  HGB 13.3 13.7 14.5  HCT 41.4 44.8 45.7  MCV 89.8 95.7 93.8  PLT 312.0 333 304   Basic Metabolic Panel: Recent Labs  Lab 11/30/21 1151 12/01/21 2106 12/02/21 0553  NA 139 142 138  K 3.8 3.9 3.8  CL 104 106 102  CO2 GLUCOSE 89 136* 111*  BUN CREATININE 0.72 0.84 0.72  CALCIUM 9.2 9.1 8.8*  MG  --   --  2.0  PHOS  --   --  4.7*   GFR: Estimated Creatinine Clearance: 103.8 mL/min (by C-G formula based on SCr of 0.72 mg/dL). Liver Function Tests: Recent Labs  Lab 11/30/21 1151 12/01/21 2106 12/02/21 0553  AST ALT ALKPHOS 91 89 86  BILITOT 0.4 0.3 0.4  PROT 7.1 6.8 6.9  ALBUMIN 3.5 3.1* 3.2*   No results for input(s): LIPASE, AMYLASE  in the last 168 hours. No results for input(s): AMMONIA in the last 168 hours. Coagulation Profile: No results for input(s): INR, PROTIME in the last 168 hours. Cardiac Enzymes: No results for input(s): CKTOTAL, CKMB, CKMBINDEX, TROPONINI in the last 168 hours. BNP (last 3 results) No results for input(s): PROBNP in the last 8760 hours. HbA1C: Recent Labs    12/02/21 0553  HGBA1C 6.2*   CBG: No results for input(s): GLUCAP in the last 168 hours. Lipid Profile: Recent Labs    12/02/21 0553  CHOL 179   HDL 40*  LDLCALC 126*  TRIG 63  CHOLHDL 4.5   Thyroid Function Tests: No results for input(s): TSH, T4TOTAL, FREET4, T3FREE, THYROIDAB in the last 72 hours. Anemia Panel: No results for input(s): VITAMINB12, FOLATE, FERRITIN, TIBC, IRON, RETICCTPCT in the last 72 hours. Urine analysis:    Component Value Date/Time   BILIRUBINUR negative 11/30/2021 1341   PROTEINUR Negative 11/30/2021 1341   UROBILINOGEN 0.2 11/30/2021 1341   NITRITE negative 11/30/2021 1341   LEUKOCYTESUR Negative 11/30/2021 1341   Sepsis Labs: (procalcitonin:4,lacticidven:4)  )No results found for this or any previous visit (from the past 240 hour(s)).   Radiology Studies: DG Chest 2 View  Result Date: 12/01/2021 CLINICAL DATA:  Shortness of breath. EXAM: CHEST - 2 VIEW COMPARISON:  11/30/2021. FINDINGS: Heart is enlarged and the mediastinal contour is stable. The pulmonary vasculature is mildly distended. Interstitial prominence is noted bilaterally. No consolidation, effusion, or pneumothorax. Mild degenerative changes are present in the thoracic spine. IMPRESSION: 1. Cardiomegaly with pulmonary vascular congestion. 2. Interstitial prominence bilaterally possible edema or infiltrate. Electronically Signed   By: Thornell Sartorius M.D.   On: 12/01/2021 21:31   ECHOCARDIOGRAM COMPLETE  Result Date: 12/02/2021    ECHOCARDIOGRAM REPORT   Patient Name:   Theresa Marquez Date of Exam: 12/02/2021 Medical Rec #:  [id number]       Height:       64.0 in Accession #:    [id number]      Weight:       280.6 lb Date of Birth:  Jan 13, 1966       BSA:          2.259 m Patient Age:    56 years        BP:           132/73 mmHg Patient Gender: F               HR:           82 bpm. Exam Location:  Inpatient Procedure: 2D Echo, Cardiac Doppler and Color Doppler Indications:    Chest pain  History:        Patient has no prior history of Echocardiogram examinations.                 CHF.  Sonographer:    Eduard Roux Referring Phys:  [id number] Darlin Drop  Sonographer Comments: Patient is morbidly obese. Image acquisition challenging due to respiratory motion. IMPRESSIONS  1. Limited study due to poor echo windows.  2. Left ventricular ejection fraction, by estimation, is 55 to 60%. The left ventricle has normal function. Left ventricular endocardial border not optimally defined to evaluate regional wall motion. Left ventricular diastolic parameters are consistent with Grade I diastolic dysfunction (impaired relaxation).  3. Right ventricular systolic function is normal. The right ventricular size is normal. Tricuspid regurgitation signal is inadequate for assessing PA pressure.  4. The mitral valve is normal in  structure. Trivial mitral valve regurgitation. No evidence of mitral stenosis.  5. The aortic valve was not well visualized. Aortic valve regurgitation is not visualized. No aortic stenosis is present.  6. The inferior vena cava is dilated in size with <50% respiratory variability, suggesting right atrial pressure of 15 mmHg.  7. Cannot exclude a small PFO. Comparison(s): No prior Echocardiogram. FINDINGS  Left Ventricle: Left ventricular ejection fraction, by estimation, is 55 to 60%. The left ventricle has normal function. Left ventricular endocardial border not optimally defined to evaluate regional wall motion. The left ventricular internal cavity size was normal in size. There is no left ventricular hypertrophy. Left ventricular diastolic parameters are consistent with Grade I diastolic dysfunction (impaired relaxation). Right Ventricle: The right ventricular size is normal. No increase in right ventricular wall thickness. Right ventricular systolic function is normal. Tricuspid regurgitation signal is inadequate for assessing PA pressure. Left Atrium: Left atrial size was normal in size. Right Atrium: Right atrial size was normal in size. Pericardium: There is no evidence of pericardial effusion. Mitral Valve: The mitral valve is  normal in structure. There is mild thickening of the mitral valve leaflet(s). Mild mitral annular calcification. Trivial mitral valve regurgitation. No evidence of mitral valve stenosis. Tricuspid Valve: The tricuspid valve is normal in structure. Tricuspid valve regurgitation is trivial. Aortic Valve: The aortic valve was not well visualized. Aortic valve regurgitation is not visualized. No aortic stenosis is present. Aortic valve peak gradient measures 14.1 mmHg. Pulmonic Valve: The pulmonic valve was not well visualized. Pulmonic valve regurgitation is trivial. Aorta: The aortic root and ascending aorta are structurally normal, with no evidence of dilitation. Venous: The inferior vena cava is dilated in size with less than 50% respiratory variability, suggesting right atrial pressure of 15 mmHg. IAS/Shunts: Cannot exclude a small PFO.  LEFT VENTRICLE PLAX 2D LVIDd:         6.10 cm Diastology LVIDs:         5.10 cm LV e' medial:    6.90 cm/s LV PW:         0.90 cm LV E/e' medial:  13.5 LV IVS:        1.00 cm LV e' lateral:   9.14 cm/s                        LV E/e' lateral: 10.2  RIGHT VENTRICLE          IVC RV Basal diam:  3.40 cm  IVC diam: 2.30 cm RV Mid diam:    3.20 cm TAPSE (M-mode): 3.0 cm LEFT ATRIUM           Index        RIGHT ATRIUM           Index LA diam:      3.50 cm 1.55 cm/m   RA Area:     21.40 cm LA Vol (A2C): 56.1 ml 24.83 ml/m  RA Volume:   62.50 ml  27.66 ml/m LA Vol (A4C): 57.7 ml 25.54 ml/m  AORTIC VALVE               PULMONIC VALVE AV Vmax:      187.50 cm/s  PV Vmax:       1.32 m/s AV Peak Grad: 14.1 mmHg    PV Peak grad:  7.0 mmHg LVOT Vmax:    166.00 cm/s LVOT Vmean:   114.000 cm/s LVOT VTI:     0.352 m  AORTA Ao Asc  diam: 3.30 cm MITRAL VALVE MV Area (PHT): 4.06 cm    SHUNTS MV Decel Time: 187 msec    Systemic VTI: 0.35 m MV E velocity: 93.00 cm/s MV A velocity: 97.90 cm/s MV E/A ratio:  0.95 Laurance Flatten MD Electronically signed by Laurance Flatten MD Signature Date/Time:  12/02/2021/10:17:01 AM    Final    VAS [place] LOWER EXTREMITY VENOUS (DVT)  Result Date: 12/01/2021  Lower Venous DVT Study Patient Name:  Theresa Marquez  Date of Exam:   12/01/2021 Medical Rec #: [id number]        Accession #:    [id number] Date of Birth: October 11, 1965        Patient Gender: F Patient Age:   74 years Exam Location:  [facility] Procedure:      VAS [place] LOWER EXTREMITY VENOUS (DVT) Referring Phys: Fanny Bien --------------------------------------------------------------------------------  Indications: History of lower extremity swelling for several months. Bilateral lower extremity persistent edema and redness x 2 weeks. Patient c/o dyspnea on exertion and with coughing x 2 weeks. Patient reports recent diagnosis of an enlarged heart.  Comparison Study: NA Performing Technologist: Tyna Jaksch RVT  Examination Guidelines: A complete evaluation includes B-mode imaging, spectral Doppler, color Doppler, and power Doppler as needed of all accessible portions of each vessel. Bilateral testing is considered an integral part of a complete examination. Limited examinations for reoccurring indications may be performed as noted. The reflux portion of the exam is performed with the patient in reverse Trendelenburg.  +---------+---------------+---------+-----------+----------+--------------+ RIGHT    CompressibilityPhasicitySpontaneityPropertiesThrombus Aging +---------+---------------+---------+-----------+----------+--------------+ CFV      Full                    Yes                                 +---------+---------------+---------+-----------+----------+--------------+ SFJ      Full                    Yes                                 +---------+---------------+---------+-----------+----------+--------------+ FV Prox  Full                    Yes                                 +---------+---------------+---------+-----------+----------+--------------+ FV Mid   Full                                                         +---------+---------------+---------+-----------+----------+--------------+ FV DistalFull                    Yes                                 +---------+---------------+---------+-----------+----------+--------------+ PFV      Full                    Yes                                 +---------+---------------+---------+-----------+----------+--------------+  POP      Full                    Yes                                 +---------+---------------+---------+-----------+----------+--------------+ PTV      Full                                                        +---------+---------------+---------+-----------+----------+--------------+ PERO     Full                                                        +---------+---------------+---------+-----------+----------+--------------+ Gastroc  Full                                                        +---------+---------------+---------+-----------+----------+--------------+ GSV      Full                    Yes                                 +---------+---------------+---------+-----------+----------+--------------+   Right Technical Findings: Mild pulsatile venous flow noted throughout the lower extremity.  +---------+---------------+---------+-----------+----------+--------------+ LEFT     CompressibilityPhasicitySpontaneityPropertiesThrombus Aging +---------+---------------+---------+-----------+----------+--------------+ CFV      Full                    Yes                                 +---------+---------------+---------+-----------+----------+--------------+ SFJ      Full                    Yes                                 +---------+---------------+---------+-----------+----------+--------------+ FV Prox  Full                    Yes                                  +---------+---------------+---------+-----------+----------+--------------+ FV Mid   Full                                                        +---------+---------------+---------+-----------+----------+--------------+ FV DistalFull                    Yes                                 +---------+---------------+---------+-----------+----------+--------------+  PFV      Full                    Yes                                 +---------+---------------+---------+-----------+----------+--------------+ POP      Full                    Yes                                 +---------+---------------+---------+-----------+----------+--------------+ PTV      Full                                                        +---------+---------------+---------+-----------+----------+--------------+ PERO     Full                                                        +---------+---------------+---------+-----------+----------+--------------+ Gastroc  Full                                                        +---------+---------------+---------+-----------+----------+--------------+ GSV      Full                    Yes                                 +---------+---------------+---------+-----------+----------+--------------+   Left Technical Findings: Mild pulsatile venous flow noted throughout the lower extremity.   Summary: BILATERAL: - No evidence of deep vein thrombosis seen in the lower extremities, bilaterally. - No evidence of superficial venous thrombosis in the lower extremities, bilaterally. -No evidence of popliteal cyst, bilaterally.  - Mild pulsatile venous flow noted throughout the bilateral lower extremities. Pulsatile lower limb venous Doppler waveform correlates well with increase right atrium pressure; right-sided heart failure.  *See table(s) above for measurements and observations. Electronically signed by Charlton HawsPeter Nishan MD on 12/01/2021 at 3:09:09 PM.     Final      Scheduled Meds:  dapagliflozin propanediol  10 mg Oral Daily   enoxaparin (LOVENOX) injection  40 mg Subcutaneous Q24H   furosemide  40 mg Intravenous BID   ipratropium-albuterol  3 mL Nebulization Q6H   loratadine  10 mg Oral Daily   nicotine  14 mg Transdermal Daily   Continuous Infusions:   LOS: 0 days    Time spent: 35min   Zannie CovePreetha Katherina Wimer, MD [facility]   12/02/2021, 3:13 PM

## 2021-12-03 ENCOUNTER — Other Ambulatory Visit: Payer: Self-pay | Admitting: Family Medicine

## 2021-12-03 ENCOUNTER — Other Ambulatory Visit (HOSPITAL_COMMUNITY): Payer: Self-pay

## 2021-12-03 DIAGNOSIS — Z79899 Other long term (current) drug therapy: Secondary | ICD-10-CM | POA: Diagnosis not present

## 2021-12-03 DIAGNOSIS — I5031 Acute diastolic (congestive) heart failure: Secondary | ICD-10-CM | POA: Diagnosis present

## 2021-12-03 DIAGNOSIS — I509 Heart failure, unspecified: Secondary | ICD-10-CM

## 2021-12-03 DIAGNOSIS — Z841 Family history of disorders of kidney and ureter: Secondary | ICD-10-CM | POA: Diagnosis not present

## 2021-12-03 DIAGNOSIS — F17210 Nicotine dependence, cigarettes, uncomplicated: Secondary | ICD-10-CM | POA: Diagnosis present

## 2021-12-03 DIAGNOSIS — J449 Chronic obstructive pulmonary disease, unspecified: Secondary | ICD-10-CM | POA: Diagnosis present

## 2021-12-03 DIAGNOSIS — E8809 Other disorders of plasma-protein metabolism, not elsewhere classified: Secondary | ICD-10-CM | POA: Diagnosis present

## 2021-12-03 DIAGNOSIS — R600 Localized edema: Secondary | ICD-10-CM

## 2021-12-03 DIAGNOSIS — R0609 Other forms of dyspnea: Secondary | ICD-10-CM | POA: Diagnosis present

## 2021-12-03 DIAGNOSIS — Z809 Family history of malignant neoplasm, unspecified: Secondary | ICD-10-CM | POA: Diagnosis not present

## 2021-12-03 DIAGNOSIS — R739 Hyperglycemia, unspecified: Secondary | ICD-10-CM | POA: Diagnosis present

## 2021-12-03 DIAGNOSIS — D72829 Elevated white blood cell count, unspecified: Secondary | ICD-10-CM | POA: Diagnosis present

## 2021-12-03 DIAGNOSIS — Z6841 Body Mass Index (BMI) 40.0 and over, adult: Secondary | ICD-10-CM | POA: Diagnosis not present

## 2021-12-03 DIAGNOSIS — R7303 Prediabetes: Secondary | ICD-10-CM | POA: Diagnosis present

## 2021-12-03 LAB — CBC
HCT: 45.3 % (ref 36.0–46.0)
Hemoglobin: 14.5 g/dL (ref 12.0–15.0)
MCH: 30 pg (ref 26.0–34.0)
MCHC: 32 g/dL (ref 30.0–36.0)
MCV: 93.6 fL (ref 80.0–100.0)
Platelets: 310 10*3/uL (ref 150–400)
RBC: 4.84 MIL/uL (ref 3.87–5.11)
RDW: 14.4 % (ref 11.5–15.5)
WBC: 11.4 10*3/uL — ABNORMAL HIGH (ref 4.0–10.5)
nRBC: 0 % (ref 0.0–0.2)

## 2021-12-03 LAB — BASIC METABOLIC PANEL
Anion gap: 6 (ref 5–15)
BUN: 10 mg/dL (ref 6–20)
CO2: 28 mmol/L (ref 22–32)
Calcium: 8.9 mg/dL (ref 8.9–10.3)
Chloride: 105 mmol/L (ref 98–111)
Creatinine, Ser: 0.71 mg/dL (ref 0.44–1.00)
GFR, Estimated: >60 mL/min (ref >60)
Glucose, Bld: 112 mg/dL — ABNORMAL HIGH (ref 70–99)
Potassium: 4.2 mmol/L (ref 3.5–5.1)
Sodium: 139 mmol/L (ref 135–145)

## 2021-12-03 MED ORDER — ENOXAPARIN SODIUM 60 MG/0.6ML IJ SOSY
60.0000 mg | PREFILLED_SYRINGE | INTRAMUSCULAR | Status: DC
  Administered 2021-12-03 – 2021-12-06 (×4): 60 mg via SUBCUTANEOUS
  Filled 2021-12-03 (×5): qty 0.6

## 2021-12-03 MED ORDER — FUROSEMIDE 10 MG/ML IJ SOLN
80.0000 mg | Freq: Two times a day (BID) | INTRAMUSCULAR | Status: DC
  Administered 2021-12-03 – 2021-12-05 (×6): 80 mg via INTRAVENOUS
  Filled 2021-12-03 (×6): qty 8

## 2021-12-03 MED ORDER — POTASSIUM CHLORIDE CRYS ER 20 MEQ PO TBCR
40.0000 meq | EXTENDED_RELEASE_TABLET | Freq: Every day | ORAL | Status: DC
  Administered 2021-12-03: 40 meq via ORAL
  Filled 2021-12-03: qty 2

## 2021-12-03 MED ORDER — POTASSIUM CHLORIDE CRYS ER 20 MEQ PO TBCR
20.0000 meq | EXTENDED_RELEASE_TABLET | Freq: Every day | ORAL | Status: DC
  Administered 2021-12-04 – 2021-12-05 (×2): 20 meq via ORAL
  Filled 2021-12-03 (×2): qty 1

## 2021-12-03 MED ORDER — SPIRONOLACTONE 12.5 MG HALF TABLET
12.5000 mg | ORAL_TABLET | Freq: Every day | ORAL | Status: DC
  Administered 2021-12-03 – 2021-12-05 (×3): 12.5 mg via ORAL
  Filled 2021-12-03 (×3): qty 1

## 2021-12-03 NOTE — Progress Notes (Signed)
Heart Failure Navigator Progress Note  Following this hospitalization to assess for HV TOC readiness.   New HF IV lasix   ? AHF?  Rhae Hammock, BSN, Scientist, clinical (histocompatibility and immunogenetics) Only

## 2021-12-03 NOTE — TOC Progression Note (Signed)
Transition of Care [facility]) - Progression Note    Patient Details  Name: Theresa Marquez MRN: [id number] Date of Birth: 1966-06-15  Transition [facility]) CM/SW Contact  Leone Haven, RN Phone Number: 12/03/2021, 10:35 AM  Clinical Narrative:    from home ,indep, new  CHF, conts on IV lasix.  TOC will continue to follow for dc needs.         Expected Discharge Plan and Services                                                 Social Determinants of Health (SDOH) Interventions    Readmission Risk Interventions     No data to display

## 2021-12-03 NOTE — Discharge Instructions (Signed)

## 2021-12-03 NOTE — Progress Notes (Signed)
Heart Failure Stewardship Pharmacist Progress Note   PCP: Garnette Gunner, MD PCP-Cardiologist: None   HPI:   56 y.o. female with medical history significant for severe morbid obesity, bilateral lower extremity edema, tobacco abuse, who presented to [facility] ED from home with complaints of dyspnea with minimal exertion, gradually worsening for the past 2 weeks.  Associated with nonproductive cough, worsening pitting bilateral lower extremity edema, all the way up to her thighs.  Endorses having chest tightness last night that improved at rest.  Ongoing tobacco user, 1 PPD.  Upon presentation to the ED, chest x-ray revealed cardiomegaly and pulmonary edema.  She received 1 dose of IV Lasix 40 mg. 12-lead EKG indicated NSR. BNP 33.3.   Echo on 12/02/2021 revealed EF 55-60% with normal RV function, however study was limited due to poor windows with no prior studies available for comparison.    Current HF Medications: Diuretic: furosemide 80 mg IV BID ACE/ARB/ARNI: none Aldosterone Antagonist: none SGLT2i: Farxiga 10 mg daily  Prior to admission HF Medications: None  Pertinent Lab Values: Serum creatinine 0.71, BUN 10, Potassium 4.2, Sodium 139, BNP 33.3, Magnesium 2.0, A1c 6.2%  Vital Signs: Weight: 267 lbs (admission weight: 268 lbs) Blood pressure: 115-150/60  Heart rate: 80 I/O: not completely documented  Medication Assistance / Insurance Benefits Check: Does the patient have prescription insurance?  Yes Type of insurance plan: Chartered loss adjuster   Outpatient Pharmacy:  Prior to admission outpatient pharmacy: [facility] Is the patient willing to use [facility] TOC pharmacy at discharge? No Is the patient willing to transition their outpatient pharmacy to utilize a [facility] outpatient pharmacy?   Pending  Assessment: 1. Acute diastolic CHF (LVEF 55-60%), due to unclear etiology. NYHA class III symptoms. -Patient volume overload per exam with 2+ pitting edema and rales.  Only 500 cc UOP doucumented with furosemide 20 mg IV x1, however I/O likely incomplete. Patient with 1 lb weight loss. Agree with increase of furosemide to 80 mg IV BID. Monitor UOP. BNP 33. BNP may be falsely low due to obesity, but not in proportion to exam. Recommend work-up for amyloid, possible viral cardiomyopathy.  -Recommend initiation of spironolactone 25 mg daily based on 2023 HFpEF guidelines. Scr stable. K 4.2 and started on IV diuresis will help maintain K wnl. Can drop Kcl 40 mEq daily. -Agree with addition of Farxiga 10 mg daily due to HFpEF based on 2023 HFpEF guidelines and A1c of 6.2%.  -BP labile, but room for initiation of therapy. Recommend initiation of Entresto 24/26 mg BID per 2023 HFpEF guidelines.    Plan: 1) Medication changes recommended at this time: -Recommend amyloid work-up -Start spironolactone 25 mg daily -Start Entresto 24/26 mg BID  2) Patient assistance: Sherryll Burger copay $15 Marcelline Deist copay $15  3)  Education  - To be completed prior to discharge  Drake Leach, PharmD, [facility] PGY2 Cardiology Pharmacy Resident

## 2021-12-03 NOTE — TOC Benefit Eligibility Note (Signed)
Patient Product/process development scientist completed.    The patient is currently admitted and upon discharge could be taking Farxiga 10 mg.  The current 30 day co-pay is, $15.00.   The patient is insured through [employer]     Roland Earl, CPhT Pharmacy Patient Advocate Specialist [facility] Patient Advocate Team Direct Number: [phone]  Fax: [phone]

## 2021-12-03 NOTE — Progress Notes (Signed)
PROGRESS NOTE    Theresa Marquez  NKN:[id number] DOB: 05-Apr-1966 DOA: 12/01/2021 PCP: Theresa Gunner, MD  56/F with history of morbid obesity, intermittent edema, tobacco use, COPD presented to the ED with worsening dyspnea on exertion, cough and edema ongoing for several months worse in the last 2 weeks.  Chest x-ray noted cardiomegaly, pulmonary edema.  BNP was 33, troponin was negative, albumin is 3.2 -Echo with preserved EF, poor acoustic windows, started on diuretics  Subjective: -Continues to be short of breath, for some reason RT did not place her on CPAP last night, limited response to diuretics thus far  Assessment and Plan:  Acute diastolic CHF, new onset -Ongoing dyspnea on exertion and swelling for several weeks -2D echo limited by poor acoustic windows, preserved EF, grade 1 diastolic dysfunction, RV function preserved, PA pressures could not be assessed -Poor response to diuretics so far, increase Lasix dose to 80 Mg twice daily, started on SGLT2i yesterday, add Aldactone -Will request advanced heart failure consult tomorrow if response continues to be poor -Mild hypoalbuminemia, follow-up SPEP, TSH is normal -Heart failure navigator consult, dietitian consult  Suspected sleep apnea -Transient apneic episodes witnessed during my encounter with patient yesterday morning  -Started nightly CPAP will send pulmonary referral for sleep study at discharge   Chest tightness -Resolved, likely secondary to above, high-sensitivity troponins are negative -Echo with preserved EF, monitor   Morbid obesity BMI 48 -Needs weight loss and lifestyle modification   Hyperglycemia, borderline diabetes -HbA1c is 6.2, SGLT2i added  Leukocytosis -Likely reactive, monitor   Tobacco use disorder Tobacco cessation counseling Nicotine patch     DVT prophylaxis: Subcu Lovenox daily   Code Status: Full code   Family Communication: None at bedside Disposition Plan: Home pending  improvement in volume status will be  Consultants:    Procedures:   Antimicrobials:    Objective: Vitals:   12/03/21 0501 12/03/21 0732 12/03/21 0818 12/03/21 1133  BP: (!) 151/63 (!) 145/84  (!) 144/86  Pulse: 80 87  93  Resp: 18 19  19   Temp: 97.9 F (36.6 C) 98.1 F (36.7 C)  98.1 F (36.7 C)  TempSrc: Oral Oral  Oral  SpO2: 92% (!) 89% 90% 95%  Weight: 121.2 kg     Height:        Intake/Output Summary (Last 24 hours) at 12/03/2021 1244 Last data filed at 12/03/2021 1134 Gross per 24 hour  Intake 1074 ml  Output 2000 ml  Net -926 ml   Filed Weights   12/02/21 1926 12/03/21 0501  Weight: 121.8 kg 121.2 kg    Examination:  General exam: Obese chronically ill female sitting up in bed, more awake today, AAOx3 HEENT: Positive JVD CVS: S1-S2, regular rhythm Lungs: Fine basilar rales Abdomen: Soft, obese, nontender, bowel sounds present Extremities: 2+ edema  Skin: No rashes Psychiatry:  Mood & affect appropriate.     Data Reviewed:   CBC: Recent Labs  Lab 11/30/21 1151 12/01/21 2106 12/02/21 0553 12/03/21 0350  WBC 11.1* 12.8* 12.2* 11.4*  NEUTROABS 7.8* 8.9* 8.8*  --   HGB 13.3 13.7 14.5 14.5  HCT 41.4 44.8 45.7 45.3  MCV 89.8 95.7 93.8 93.6  PLT 312.0 333 304 310   Basic Metabolic Panel: Recent Labs  Lab 11/30/21 1151 12/01/21 2106 12/02/21 0553 12/03/21 0350  NA 139 142 138 139  K 3.8 3.9 3.8 4.2  CL 104 106 102 105  CO2 29 27 26 28   GLUCOSE 89 136* 111* 112*  BUN CREATININE 0.72 0.84 0.72 0.71  CALCIUM 9.2 9.1 8.8* 8.9  MG  --   --  2.0  --   PHOS  --   --  4.7*  --    GFR: Estimated Creatinine Clearance: 100.8 mL/min (by C-G formula based on SCr of 0.71 mg/dL). Liver Function Tests: Recent Labs  Lab 11/30/21 1151 12/01/21 2106 12/02/21 0553  AST ALT ALKPHOS 91 89 86  BILITOT 0.4 0.3 0.4  PROT 7.1 6.8 6.9  ALBUMIN 3.5 3.1* 3.2*   No results for input(s): "LIPASE", "AMYLASE" in the last 168  hours. No results for input(s): "AMMONIA" in the last 168 hours. Coagulation Profile: No results for input(s): "INR", "PROTIME" in the last 168 hours. Cardiac Enzymes: No results for input(s): "CKTOTAL", "CKMB", "CKMBINDEX", "TROPONINI" in the last 168 hours. BNP (last 3 results) No results for input(s): "PROBNP" in the last 8760 hours. HbA1C: Recent Labs    12/02/21 0553  HGBA1C 6.2*   CBG: No results for input(s): "GLUCAP" in the last 168 hours. Lipid Profile: Recent Labs    12/02/21 0553  CHOL 179  HDL 40*  LDLCALC 126*  TRIG 63  CHOLHDL 4.5   Thyroid Function Tests: Recent Labs    12/02/21 1937  TSH 1.914   Anemia Panel: No results for input(s): "VITAMINB12", "FOLATE", "FERRITIN", "TIBC", "IRON", "RETICCTPCT" in the last 72 hours. Urine analysis:    Component Value Date/Time   BILIRUBINUR negative 11/30/2021 1341   PROTEINUR Negative 11/30/2021 1341   UROBILINOGEN 0.2 11/30/2021 1341   NITRITE negative 11/30/2021 1341   LEUKOCYTESUR Negative 11/30/2021 1341   Sepsis Labs: (procalcitonin:4,lacticidven:4)  )No results found for this or any previous visit (from the past 240 hour(s)).   Radiology Studies: ECHOCARDIOGRAM COMPLETE  Result Date: 12/02/2021    ECHOCARDIOGRAM REPORT   Patient Name:   Theresa Marquez Date of Exam: 12/02/2021 Medical Rec #:  [id number]       Height:       64.0 in Accession #:    [id number]      Weight:       280.6 lb Date of Birth:  17-May-1966       BSA:          2.259 m Patient Age:    56 years        BP:           132/73 mmHg Patient Gender: F               HR:           82 bpm. Exam Location:  Inpatient Procedure: 2D Echo, Cardiac Doppler and Color Doppler Indications:    Chest pain  History:        Patient has no prior history of Echocardiogram examinations.                 CHF.  Sonographer:    Eduard Roux Referring Phys: [id number] Darlin Drop  Sonographer Comments: Patient is morbidly obese. Image acquisition challenging  due to respiratory motion. IMPRESSIONS  1. Limited study due to poor echo windows.  2. Left ventricular ejection fraction, by estimation, is 55 to 60%. The left ventricle has normal function. Left ventricular endocardial border not optimally defined to evaluate regional wall motion. Left ventricular diastolic parameters are consistent with Grade I diastolic dysfunction (impaired relaxation).  3. Right ventricular systolic function is normal. The right ventricular size  is normal. Tricuspid regurgitation signal is inadequate for assessing PA pressure.  4. The mitral valve is normal in structure. Trivial mitral valve regurgitation. No evidence of mitral stenosis.  5. The aortic valve was not well visualized. Aortic valve regurgitation is not visualized. No aortic stenosis is present.  6. The inferior vena cava is dilated in size with <50% respiratory variability, suggesting right atrial pressure of 15 mmHg.  7. Cannot exclude a small PFO. Comparison(s): No prior Echocardiogram. FINDINGS  Left Ventricle: Left ventricular ejection fraction, by estimation, is 55 to 60%. The left ventricle has normal function. Left ventricular endocardial border not optimally defined to evaluate regional wall motion. The left ventricular internal cavity size was normal in size. There is no left ventricular hypertrophy. Left ventricular diastolic parameters are consistent with Grade I diastolic dysfunction (impaired relaxation). Right Ventricle: The right ventricular size is normal. No increase in right ventricular wall thickness. Right ventricular systolic function is normal. Tricuspid regurgitation signal is inadequate for assessing PA pressure. Left Atrium: Left atrial size was normal in size. Right Atrium: Right atrial size was normal in size. Pericardium: There is no evidence of pericardial effusion. Mitral Valve: The mitral valve is normal in structure. There is mild thickening of the mitral valve leaflet(s). Mild mitral annular  calcification. Trivial mitral valve regurgitation. No evidence of mitral valve stenosis. Tricuspid Valve: The tricuspid valve is normal in structure. Tricuspid valve regurgitation is trivial. Aortic Valve: The aortic valve was not well visualized. Aortic valve regurgitation is not visualized. No aortic stenosis is present. Aortic valve peak gradient measures 14.1 mmHg. Pulmonic Valve: The pulmonic valve was not well visualized. Pulmonic valve regurgitation is trivial. Aorta: The aortic root and ascending aorta are structurally normal, with no evidence of dilitation. Venous: The inferior vena cava is dilated in size with less than 50% respiratory variability, suggesting right atrial pressure of 15 mmHg. IAS/Shunts: Cannot exclude a small PFO.  LEFT VENTRICLE PLAX 2D LVIDd:         6.10 cm Diastology LVIDs:         5.10 cm LV e' medial:    6.90 cm/s LV PW:         0.90 cm LV E/e' medial:  13.5 LV IVS:        1.00 cm LV e' lateral:   9.14 cm/s                        LV E/e' lateral: 10.2  RIGHT VENTRICLE          IVC RV Basal diam:  3.40 cm  IVC diam: 2.30 cm RV Mid diam:    3.20 cm TAPSE (M-mode): 3.0 cm LEFT ATRIUM           Index        RIGHT ATRIUM           Index LA diam:      3.50 cm 1.55 cm/m   RA Area:     21.40 cm LA Vol (A2C): 56.1 ml 24.83 ml/m  RA Volume:   62.50 ml  27.66 ml/m LA Vol (A4C): 57.7 ml 25.54 ml/m  AORTIC VALVE               PULMONIC VALVE AV Vmax:      187.50 cm/s  PV Vmax:       1.32 m/s AV Peak Grad: 14.1 mmHg    PV Peak grad:  7.0 mmHg LVOT Vmax:    166.00  cm/s LVOT Vmean:   114.000 cm/s LVOT VTI:     0.352 m  AORTA Ao Asc diam: 3.30 cm MITRAL VALVE MV Area (PHT): 4.06 cm    SHUNTS MV Decel Time: 187 msec    Systemic VTI: 0.35 m MV E velocity: 93.00 cm/s MV A velocity: 97.90 cm/s MV E/A ratio:  0.95 Laurance FlattenHeather Pemberton MD Electronically signed by Laurance FlattenHeather Pemberton MD Signature Date/Time: 12/02/2021/10:17:01 AM    Final    DG Chest 2 View  Result Date: 12/01/2021 CLINICAL DATA:   Shortness of breath. EXAM: CHEST - 2 VIEW COMPARISON:  11/30/2021. FINDINGS: Heart is enlarged and the mediastinal contour is stable. The pulmonary vasculature is mildly distended. Interstitial prominence is noted bilaterally. No consolidation, effusion, or pneumothorax. Mild degenerative changes are present in the thoracic spine. IMPRESSION: 1. Cardiomegaly with pulmonary vascular congestion. 2. Interstitial prominence bilaterally possible edema or infiltrate. Electronically Signed   By: Thornell SartoriusLaura  Taylor M.D.   On: 12/01/2021 21:31   VAS US LOWER EXTREMITY VENOUS (DVT)  Result Date: 12/01/2021  Lower Venous DVT Study Patient Name:  Linden DolinBETHANY A Pulcini  Date of Exam:   12/01/2021 Medical Rec #: [id number]        Accession #:    [id number] Date of Birth: 05-Mar-1966        Patient Gender: F Patient Age:   [age] years Exam Location:  [facility] Procedure:      VAS US LOWER EXTREMITY VENOUS (DVT) Referring Phys: Fanny BienAARON THOMPSON --------------------------------------------------------------------------------  Indications: History of lower extremity swelling for several months. Bilateral lower extremity persistent edema and redness x 2 weeks. Patient c/o dyspnea on exertion and with coughing x 2 weeks. Patient reports recent diagnosis of an enlarged heart.  Comparison Study: NA Performing Technologist: Tyna JakschKeisha Barrett RVT  Examination Guidelines: A complete evaluation includes B-mode imaging, spectral Doppler, color Doppler, and power Doppler as needed of all accessible portions of each vessel. Bilateral testing is considered an integral part of a complete examination. Limited examinations for reoccurring indications may be performed as noted. The reflux portion of the exam is performed with the patient in reverse Trendelenburg.  +---------+---------------+---------+-----------+----------+--------------+ RIGHT    CompressibilityPhasicitySpontaneityPropertiesThrombus Aging  +---------+---------------+---------+-----------+----------+--------------+ CFV      Full                    Yes                                 +---------+---------------+---------+-----------+----------+--------------+ SFJ      Full                    Yes                                 +---------+---------------+---------+-----------+----------+--------------+ FV Prox  Full                    Yes                                 +---------+---------------+---------+-----------+----------+--------------+ FV Mid   Full                                                        +---------+---------------+---------+-----------+----------+--------------+  FV DistalFull                    Yes                                 +---------+---------------+---------+-----------+----------+--------------+ PFV      Full                    Yes                                 +---------+---------------+---------+-----------+----------+--------------+ POP      Full                    Yes                                 +---------+---------------+---------+-----------+----------+--------------+ PTV      Full                                                        +---------+---------------+---------+-----------+----------+--------------+ PERO     Full                                                        +---------+---------------+---------+-----------+----------+--------------+ Gastroc  Full                                                        +---------+---------------+---------+-----------+----------+--------------+ GSV      Full                    Yes                                 +---------+---------------+---------+-----------+----------+--------------+   Right Technical Findings: Mild pulsatile venous flow noted throughout the lower extremity.  +---------+---------------+---------+-----------+----------+--------------+ LEFT      CompressibilityPhasicitySpontaneityPropertiesThrombus Aging +---------+---------------+---------+-----------+----------+--------------+ CFV      Full                    Yes                                 +---------+---------------+---------+-----------+----------+--------------+ SFJ      Full                    Yes                                 +---------+---------------+---------+-----------+----------+--------------+ FV Prox  Full                    Yes                                 +---------+---------------+---------+-----------+----------+--------------+  FV Mid   Full                                                        +---------+---------------+---------+-----------+----------+--------------+ FV DistalFull                    Yes                                 +---------+---------------+---------+-----------+----------+--------------+ PFV      Full                    Yes                                 +---------+---------------+---------+-----------+----------+--------------+ POP      Full                    Yes                                 +---------+---------------+---------+-----------+----------+--------------+ PTV      Full                                                        +---------+---------------+---------+-----------+----------+--------------+ PERO     Full                                                        +---------+---------------+---------+-----------+----------+--------------+ Gastroc  Full                                                        +---------+---------------+---------+-----------+----------+--------------+ GSV      Full                    Yes                                 +---------+---------------+---------+-----------+----------+--------------+   Left Technical Findings: Mild pulsatile venous flow noted throughout the lower extremity.   Summary: BILATERAL: - No evidence of deep  vein thrombosis seen in the lower extremities, bilaterally. - No evidence of superficial venous thrombosis in the lower extremities, bilaterally. -No evidence of popliteal cyst, bilaterally.  - Mild pulsatile venous flow noted throughout the bilateral lower extremities. Pulsatile lower limb venous Doppler waveform correlates well with increase right atrium pressure; right-sided heart failure.  *See table(s) above for measurements and observations. Electronically signed by Charlton Haws MD on 12/01/2021 at 3:09:09 PM.    Final      Scheduled Meds:  dapagliflozin propanediol  10 mg Oral Daily   enoxaparin (LOVENOX)  injection  60 mg Subcutaneous Q24H   furosemide  80 mg Intravenous BID   ipratropium-albuterol  3 mL Nebulization Q6H   loratadine  10 mg Oral Daily   nicotine  14 mg Transdermal Daily   potassium chloride  40 mEq Oral Daily   Continuous Infusions:   LOS: 0 days    Time spent:   Zannie Cove, MD [facility]   12/03/2021, 12:44 PM

## 2021-12-03 NOTE — Plan of Care (Signed)
Nutrition Education Note  RD consulted for nutrition education regarding new onset CHF.  RD provided "Low Sodium Nutrition Therapy" handout from the Academy of [employer]etics. Reviewed patient's dietary recall. Provided examples on ways to decrease sodium intake in diet. Discouraged intake of processed foods and use of salt shaker. Encouraged fresh fruits and vegetables as well as whole grain sources of carbohydrates to maximize fiber intake. Pt reports her diet consisting of fresh fruits and vegetables as well as lean protein.   RD discussed why it is important for patient to adhere to diet recommendations, and emphasized the role of fluids, foods to avoid, and importance of weighing self daily. Teach back method used.  Expect good compliance.  Current diet order is heart healthy, patient is consuming approximately 100% of meals at this time. Pt reports having a good appetite currently and PTA with no difficulties. Labs and medications reviewed. No further nutrition interventions warranted at this time. RD contact information provided. If additional nutrition issues arise, please re-consult RD.   Theresa Parker, MS, RD, LDN RD pager number/after hours weekend pager number on Amion.

## 2021-12-04 ENCOUNTER — Encounter (HOSPITAL_COMMUNITY): Payer: Self-pay | Admitting: Internal Medicine

## 2021-12-04 DIAGNOSIS — I509 Heart failure, unspecified: Secondary | ICD-10-CM | POA: Diagnosis not present

## 2021-12-04 LAB — BASIC METABOLIC PANEL
Anion gap: 7 (ref 5–15)
BUN: 14 mg/dL (ref 6–20)
CO2: 30 mmol/L (ref 22–32)
Calcium: 9.1 mg/dL (ref 8.9–10.3)
Chloride: 99 mmol/L (ref 98–111)
Creatinine, Ser: 0.93 mg/dL (ref 0.44–1.00)
GFR, Estimated: >60 mL/min (ref >60)
Glucose, Bld: 110 mg/dL — ABNORMAL HIGH (ref 70–99)
Potassium: 3.8 mmol/L (ref 3.5–5.1)
Sodium: 136 mmol/L (ref 135–145)

## 2021-12-04 NOTE — TOC Initial Note (Addendum)
Transition of Care [facility]) - Initial/Assessment Note    Patient Details  Name: Theresa Marquez MRN: [id number] Date of Birth: 01/08/1966  [facility] CM/SW Contact:    Elliot Cousin, RN Phone Number: [phone] 12/04/2021, 1:31 PM  Clinical Narrative:                  HF TOC CM spoke to pt at bedside. Provided pt with [facility] free trial and $0 copay card and [facility] Free Trial card and $10 copay card. Explained to pt she will need to sign up. Pt wants to use her pharmacy if she dc on the weekend. Pt states she goes to [facility] and they are not open on Sunday. They are opened on Sat 9-3 pm. [phone]. Pt states she drives to her appts.   Her pharmacy has both [place] and [place] in stock.   CPAP recommended. Pt will need outpt sleep study.   Expected Discharge Plan: Home/Self Care Barriers to Discharge: Continued Medical Work up   Patient Goals and CMS Choice   CMS Medicare.gov Compare Post Acute Care list provided to:: Patient    Expected Discharge Plan and Services Expected Discharge Plan: Home/Self Care   Discharge Planning Services: CM Consult, Medication Assistance                                          Prior Living Arrangements/Services   Lives with:: Adult Children Patient language and need for interpreter reviewed:: Yes Do you feel safe going back to the place where you live?: Yes      Need for Family Participation in Patient Care: No (Comment) Care giver support system in place?: No (comment)   Criminal Activity/Legal Involvement Pertinent to Current Situation/Hospitalization: No - Comment as needed  Activities of Daily Living Home Assistive Devices/Equipment: None ADL Screening (condition at time of admission) Patient's cognitive ability adequate to safely complete daily activities?: Yes Is the patient deaf or have difficulty hearing?: No Does the patient have difficulty seeing, even when wearing glasses/contacts?:  No Does the patient have difficulty concentrating, remembering, or making decisions?: No Patient able to express need for assistance with ADLs?: Yes Does the patient have difficulty dressing or bathing?: No Independently performs ADLs?: Yes (appropriate for developmental age) Does the patient have difficulty walking or climbing stairs?: Yes Weakness of Legs: None Weakness of Arms/Hands: None  Permission Sought/Granted Permission sought to share information with : Case Manager, Family Supports, PCP Permission granted to share information with : Yes, Verbal Permission Granted  Share Information with NAME: Ethlyn Daniels     Permission granted to share info w Relationship: daughter  Permission granted to share info w Contact Information: [phone]  Emotional Assessment Appearance:: Appears stated age Attitude/Demeanor/Rapport: Engaged Affect (typically observed): Accepting Orientation: : Oriented to Self, Oriented to Place, Oriented to  Time, Oriented to Situation   Psych Involvement: No (comment)  Admission diagnosis:  Dyspnea on exertion [R06.09] Acute CHF (congestive heart failure) (HCC) [I50.9] Heart failure, unspecified HF chronicity, unspecified heart failure type (HCC) [I50.9] Acute exacerbation of CHF (congestive heart failure) (HCC) [I50.9] Patient Active Problem List   Diagnosis Date Noted   Acute exacerbation of CHF (congestive heart failure) (HCC) 12/03/2021   Acute CHF (congestive heart failure) (HCC) 12/02/2021   Rhinitis 11/30/2021   Obesity, morbid, BMI 40.0-49.9 (HCC) 11/10/2021   Tobacco abuse 11/10/2021  Bilateral lower extremity edema 11/10/2021   Recurrent sinus infections 11/10/2021   Family history of thyroid disease 10/05/2021   Neck swelling 10/05/2021   PCP:  Garnette Gunner, MD Pharmacy:   [facility], [place] - [address] [place] [address] Phone: [phone] Fax: [phone]     Social  Determinants of Health (SDOH) Interventions    Readmission Risk Interventions     No data to display

## 2021-12-04 NOTE — Progress Notes (Addendum)
Heart Failure Stewardship Pharmacist Progress Note   PCP: Garnette Gunner, MD PCP-Cardiologist: None   HPI:   56 y.o. female with medical history significant for severe morbid obesity, bilateral lower extremity edema, tobacco abuse, who presented to [facility] ED from home with complaints of dyspnea with minimal exertion, gradually worsening for the past 2 weeks.  Associated with nonproductive cough, worsening pitting bilateral lower extremity edema, all the way up to her thighs.  Endorses having chest tightness last night that improved at rest.  Ongoing tobacco user, 1 PPD.  Upon presentation to the ED, chest x-ray revealed cardiomegaly and pulmonary edema.  She received 1 dose of IV Lasix 40 mg. 12-lead EKG indicated NSR. BNP 33.3.   Echo on 12/02/2021 revealed EF 55-60% with normal RV function, however study was limited due to poor windows with no prior studies available for comparison.    Current HF Medications: Diuretic: furosemide 80 mg IV BID ACE/ARB/ARNI: none Aldosterone Antagonist: spironolactone 12.5 mg daily  SGLT2i: Farxiga 10 mg daily  Prior to admission HF Medications: None  Pertinent Lab Values: Serum creatinine 0.93, BUN 14, Potassium 3.8, Sodium 136, BNP 33.3, Magnesium 2.0, A1c 6.2%  Vital Signs: Weight: 264 lbs (admission weight: 268 lbs) Blood pressure: 115/87  Heart rate: 90 I/O: 3.7 L UOP/24h, net -1.3L   Medication Assistance / Insurance Benefits Check: Does the patient have prescription insurance?  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 No Is the patient willing to transition their outpatient pharmacy to utilize a [facility] outpatient pharmacy?   Pending  Assessment: 1. Acute diastolic CHF (LVEF 55-60%), due to unclear etiology. NYHA class III symptoms. -Patient volume overload per exam with 2+ pitting  edema and rales. Patient with 3.7 UOP in last 24h with current regimen. Weight down 4 lbs. Scr up slightly. Continue furosemide 80 mg IV BID.  -K 3.8, on spironolactone 12.5 mg daily and Kcl daily. Recommend increase spironolactone to 25 mg daily.  -BNP 33. BNP may be falsely low due to obesity, but not in proportion to exam. Recommend work-up for amyloid, possible viral cardiomyopathy.  -Agree with addition of Farxiga 10 mg daily due to HFpEF based on 2023 HFpEF guidelines and A1c of 6.2%.  -BP labile, but room for initiation of therapy. Recommend initiation of Entresto 24/26 mg BID per 2023 HFpEF guidelines.    Plan: 1) Medication changes recommended at this time: -Increase spironolactone to 25 mg daily -Start Entresto 24/26 mg BID  2) Patient assistance: Sherryll Burger copay $15 -Farxiga copay $15 -patient given copay cards 12/04/2021  3)  Education  - Patient has been educated on current HF medications and potential additions to HF medication regimen - Patient verbalizes understanding that over the next few months, these medication doses may change and more medications may be added to optimize HF regimen - Patient has been educated on basic disease state pathophysiology and goals of therapy  Drake Leach, PharmD, BCPS PGY2 Cardiology Pharmacy Resident

## 2021-12-04 NOTE — Progress Notes (Signed)
Heart Failure Nurse Navigator Progress Note  PCP: Garnette Gunner, MD PCP-Cardiologist: None Admission Diagnosis: Acute congestive heart failure  Admitted from: Home  Presentation:   Theresa Marquez presented with shortness of breath and chest tightness, bilateral lower extremity edema. 40 year smoker, symptoms started a couple of weeks ago, BP 170/95, HR 92, IV lasix given, EKG showed NSR, patient admitted.   Patient was educated on the sign and symptoms of heart failure, daily weights, when to call her doctor or go to the ER. The importance of daily weights, diet/ fluid restrictions, taking all medications as prescribed and attending all medical appointments. Patient stated she would like to quit smoking but has had no luck with previous attempts. Patient voiced her understanding of the education, she showed some concern with wondering if she would be able to return to her full time job and be able to do it, she is financially responsible for 2/ 3 children who both have disabilities. A hospital follow up HF TOC appointment is scheduled for 12/15/21 @ 2 pm.   ECHO/ LVEF: 55-60%  Clinical Course:  Past Medical History:  Diagnosis Date   Allergy    Endometrial polyp 08/09/2017   Formatting of this note might be different from the original. S/p hysteroscopy with [facility] 10/2017 with Dr Elmore Guise, was benign     Social History   Socioeconomic History   Marital status: Divorced    Spouse name: Not on file   Number of children: 3   Years of education: Not on file   Highest education level: Associate degree: occupational, Scientist, product/process development, or vocational program  Occupational History   Occupation: [employer]    Comment: Full-Time.  Tobacco Use   Smoking status: Every Day    Packs/day: 1.00    Years: 30.00    Total pack years: 30.00    Types: Cigarettes    Start date: 10/26/1969   Smokeless tobacco: Never   Tobacco comments:    Smoking cessation  Vaping Use   Vaping Use: Never used  Substance and  Sexual Activity   Alcohol use: Not Currently    Comment: socially   Drug use: Never   Sexual activity: Not Currently  Other Topics Concern   Not on file  Social History Narrative   Not on file   Social Determinants of Health   Financial Resource Strain: Low Risk  (12/04/2021)   Overall Financial Resource Strain (CARDIA)    Difficulty of Paying Living Expenses: Not very hard  Food Insecurity: No Food Insecurity (12/04/2021)   Hunger Vital Sign    Worried About Running Out of Food in the Last Year: Never true    Ran Out of Food in the Last Year: Never true  Transportation Needs: No Transportation Needs (12/04/2021)   PRAPARE - Administrator, Civil Service (Medical): No    Lack of Transportation (Non-Medical): No  Physical Activity: Not on file  Stress: Not on file  Social Connections: Not on file   Education Assessment and Provision:  Detailed education and instructions provided on heart failure disease management including the following:  Signs and symptoms of Heart Failure When to call the physician Importance of daily weights Low sodium diet Fluid restriction Medication management Anticipated future follow-up appointments  Patient education given on each of the above topics.  Patient acknowledges understanding via teach back method and acceptance of all instructions.  Education Materials:  "Living Better With Heart Failure" Booklet, HF zone tool, & Daily Weight Tracker Tool.  Patient has scale at home: yes Patient has pill box at home: NA    High Risk Criteria for Readmission and/or Poor Patient Outcomes: Heart failure hospital admissions (last 6 months): 0  No Show rate: 0 Difficult social situation: NO Demonstrates medication adherence: yes Primary Language: English Literacy level: reading, writing, and comprehension  Barriers of Care:   New HF Diet/ fluid restrictions Daily weights  Considerations/Referrals:   Referral made to Heart Failure  Pharmacist Stewardship: yes Referral made to Heart Failure CSW/NCM TOC: yes Referral made to Heart & Vascular TOC clinic: yes, 12/15/21  Items for Follow-up on DC/TOC: Daily weights Diet/ fluid restrictions Smoking cessations Concerns about being able to do her F/T  job.    Rhae Hammock, BSN, Scientist, clinical (histocompatibility and immunogenetics) Only

## 2021-12-04 NOTE — Progress Notes (Signed)
PROGRESS NOTE    Theresa Marquez  DQQ:[id number] DOB: [birth year] DOA: 12/01/2021 PCP: Garnette Gunner, MD  56/F with history of morbid obesity, intermittent edema, tobacco use, COPD presented to the ED with worsening dyspnea on exertion, cough and edema ongoing for several months worse in the last 2 weeks.  Chest x-ray noted cardiomegaly, pulmonary edema.  BNP was 33, troponin was negative, albumin is 3.2 -Echo with preserved EF, poor acoustic windows, started on diuretics  Subjective: -Use CPAP last night, starting to feel better  Assessment and Plan:  Acute diastolic CHF, new onset -Ongoing dyspnea on exertion and swelling for several weeks -2D echo limited by poor acoustic windows, preserved EF, grade 1 diastolic dysfunction, RV function preserved, PA pressures could not be assessed -Starting to diurese, 3.3 L yesterday, continue Lasix 80 Mg twice daily -BP started to trend soft, needs additional room for diuresis, continue current dose of Aldactone and SGLT2i today -Mild hypoalbuminemia, SPEP pending, TSH is normal -Heart failure navigator consult, dietitian consult  Suspected sleep apnea -Transient apneic episodes witnessed during my encounter with patient 6/7-Started nightly CPAP, TOC unable to set this up at discharge, she will need a sleep study to be approved by insurance, will send pulmonary referral for sleep study at discharge   Chest tightness -Resolved, likely secondary to above, high-sensitivity troponins are negative -Echo with preserved EF, monitor   Morbid obesity BMI 48 -Needs weight loss and lifestyle modification   Hyperglycemia, borderline diabetes -HbA1c is 6.2, SGLT2i added  Leukocytosis -Likely reactive, monitor   Tobacco use disorder Tobacco cessation counseling Nicotine patch  History of recurrent sinusitis -Recommended ENT follow-up at this     DVT prophylaxis: Subcu Lovenox daily   Code Status: Full code   Family Communication: None at  bedside Disposition Plan: Home pending improvement in volume status, likely 2 to 3 days  Consultants:    Procedures:   Antimicrobials:    Objective: Vitals:   12/03/21 2027 12/04/21 0210 12/04/21 0407 12/04/21 1100  BP:   115/87 131/87  Pulse:  81 68 82  Resp:  20  20  Temp:   98.7 F (37.1 C) 98.2 F (36.8 C)  TempSrc:   Oral Oral  SpO2: 95% 97% 90% 90%  Weight:   120.1 kg   Height:        Intake/Output Summary (Last 24 hours) at 12/04/2021 1219 Last data filed at 12/04/2021 1102 Gross per 24 hour  Intake 1416 ml  Output 3200 ml  Net -1784 ml   Filed Weights   12/02/21 1926 12/03/21 0501 12/04/21 0407  Weight: 121.8 kg 121.2 kg 120.1 kg    Examination:  General exam: Obese chronically ill female sitting up in bed, AAOx3, no distress HEENT: Positive JVD CVS: S1-S2, regular rhythm Lungs: Few basilar rales Abdomen: Soft, obese, nontender, abdominal wall edema Extremities: 1+ edema  Skin: No rashes Psychiatry:  Mood & affect appropriate.     Data Reviewed:   CBC: Recent Labs  Lab 11/30/21 1151 12/01/21 2106 12/02/21 0553 12/03/21 0350  WBC 11.1* 12.8* 12.2* 11.4*  NEUTROABS 7.8* 8.9* 8.8*  --   HGB 13.3 13.7 14.5 14.5  HCT 41.4 44.8 45.7 45.3  MCV 89.8 95.7 93.8 93.6  PLT 312.0 333 304 310   Basic Metabolic Panel: Recent Labs  Lab 11/30/21 1151 12/01/21 2106 12/02/21 0553 12/03/21 0350 12/04/21 0332  NA 139 142 138 139 136  K 3.8 3.9 3.8 4.2 3.8  CL 104 106 102 105 99  CO2 29 27 26 28 30   GLUCOSE 89 136* 111* 112* 110*  BUN 8 9 9 10 14   CREATININE 0.72 0.84 0.72 0.71 0.93  CALCIUM 9.2 9.1 8.8* 8.9 9.1  MG  --   --  2.0  --   --   PHOS  --   --  4.7*  --   --    GFR: Estimated Creatinine Clearance: 86.3 mL/min (by C-G formula based on SCr of 0.93 mg/dL). Liver Function Tests: Recent Labs  Lab 11/30/21 1151 12/01/21 2106 12/02/21 0553  AST 12 15 15   ALT 15 17 17   ALKPHOS 91 89 86  BILITOT 0.4 0.3 0.4  PROT 7.1 6.8 6.9  ALBUMIN  3.5 3.1* 3.2*   No results for input(s): "LIPASE", "AMYLASE" in the last 168 hours. No results for input(s): "AMMONIA" in the last 168 hours. Coagulation Profile: No results for input(s): "INR", "PROTIME" in the last 168 hours. Cardiac Enzymes: No results for input(s): "CKTOTAL", "CKMB", "CKMBINDEX", "TROPONINI" in the last 168 hours. BNP (last 3 results) No results for input(s): "PROBNP" in the last 8760 hours. HbA1C: Recent Labs    12/02/21 0553  HGBA1C 6.2*   CBG: No results for input(s): "GLUCAP" in the last 168 hours. Lipid Profile: Recent Labs    12/02/21 0553  CHOL 179  HDL 40*  LDLCALC 126*  TRIG 63  CHOLHDL 4.5   Thyroid Function Tests: Recent Labs    12/02/21 1937  TSH 1.914   Anemia Panel: No results for input(s): "VITAMINB12", "FOLATE", "FERRITIN", "TIBC", "IRON", "RETICCTPCT" in the last 72 hours. Urine analysis:    Component Value Date/Time   BILIRUBINUR negative 11/30/2021 1341   PROTEINUR Negative 11/30/2021 1341   UROBILINOGEN 0.2 11/30/2021 1341   NITRITE negative 11/30/2021 1341   LEUKOCYTESUR Negative 11/30/2021 1341   Sepsis Labs: @LABRCNTIP (procalcitonin:4,lacticidven:4)  )No results found for this or any previous visit (from the past 240 hour(s)).   Radiology Studies: No results found.   Scheduled Meds:  dapagliflozin propanediol  10 mg Oral Daily   enoxaparin (LOVENOX) injection  60 mg Subcutaneous Q24H   furosemide  80 mg Intravenous BID   ipratropium-albuterol  3 mL Nebulization Q6H   loratadine  10 mg Oral Daily   nicotine  14 mg Transdermal Daily   potassium chloride  20 mEq Oral Daily   spironolactone  12.5 mg Oral Daily   Continuous Infusions:   LOS: 1 day    Time spent: 01/30/2022   01/30/2022, MD [facility]   12/04/2021, 12:19 PM

## 2021-12-05 DIAGNOSIS — I509 Heart failure, unspecified: Secondary | ICD-10-CM | POA: Diagnosis not present

## 2021-12-05 LAB — BASIC METABOLIC PANEL
Anion gap: 9 (ref 5–15)
BUN: 19 mg/dL (ref 6–20)
CO2: 29 mmol/L (ref 22–32)
Calcium: 9.1 mg/dL (ref 8.9–10.3)
Chloride: 97 mmol/L — ABNORMAL LOW (ref 98–111)
Creatinine, Ser: 0.94 mg/dL (ref 0.44–1.00)
GFR, Estimated: >60 mL/min (ref >60)
Glucose, Bld: 107 mg/dL — ABNORMAL HIGH (ref 70–99)
Potassium: 3.9 mmol/L (ref 3.5–5.1)
Sodium: 135 mmol/L (ref 135–145)

## 2021-12-05 MED ORDER — SPIRONOLACTONE 25 MG PO TABS
25.0000 mg | ORAL_TABLET | Freq: Every day | ORAL | Status: DC
  Administered 2021-12-06 – 2021-12-07 (×2): 25 mg via ORAL
  Filled 2021-12-05 (×2): qty 1

## 2021-12-05 MED ORDER — POTASSIUM CHLORIDE CRYS ER 20 MEQ PO TBCR
20.0000 meq | EXTENDED_RELEASE_TABLET | Freq: Two times a day (BID) | ORAL | Status: DC
Start: 2021-12-05 — End: 2021-12-07
  Administered 2021-12-05 – 2021-12-07 (×4): 20 meq via ORAL
  Filled 2021-12-05 (×4): qty 1

## 2021-12-05 NOTE — Progress Notes (Signed)
PROGRESS NOTE    Theresa Marquez  YNW:[id number] DOB: 1965-10-26 DOA: 12/01/2021 PCP: Garnette Gunner, MD  56/F with history of morbid obesity, intermittent edema, tobacco use presented to the ED with worsening dyspnea on exertion, cough and edema ongoing for several months worse in the last 2 weeks.  Chest x-ray noted cardiomegaly, pulmonary edema.  BNP was 33, troponin was negative, albumin is 3.2 -Echo with preserved EF, poor acoustic windows, started on diuretics  Subjective: -Starting to feel better, breathing is improving, neb treatment caused some cramps across her chest and legs  Assessment and Plan:  Acute diastolic CHF, new onset -Ongoing dyspnea on exertion and swelling for several weeks -2D echo limited by poor acoustic windows, preserved EF, grade 1 diastolic dysfunction, RV function preserved, PA pressures could not be assessed -Diuresing better, 5 L negative, weight down 8 pounds in the last 2 days, continue IV Lasix 80 Mg twice daily, increase Aldactone dose to 25 Mg and continue SGLT2i -Mild hypoalbuminemia, SPEP pending, TSH is normal -Heart failure navigator consult, dietitian consult appreciated -BMP in a.m.  Suspected sleep apnea -Transient apneic episodes witnessed during my encounter with patient 6/7-Started nightly CPAP, TOC unable to set this up at discharge, she will need a sleep study to be approved by insurance, will send pulmonary referral for sleep study at discharge   Chest tightness -Resolved, likely secondary to above, high-sensitivity troponins are negative -Echo with preserved EF, monitor   Morbid obesity BMI 48 -Needs weight loss and lifestyle modification   Hyperglycemia, borderline diabetes -HbA1c is 6.2, SGLT2i added  Leukocytosis -Likely reactive, monitor   Tobacco use disorder Tobacco cessation counseling Nicotine patch  History of recurrent sinusitis -Recommended ENT follow-up at this     DVT prophylaxis: Subcu Lovenox daily    Code Status: Full code   Family Communication: None at bedside Disposition Plan: Home pending improvement in volume status, likely 2 to 3 days  Consultants:    Procedures:   Antimicrobials:    Objective: Vitals:   12/05/21 0530 12/05/21 0533 12/05/21 0542 12/05/21 0836  BP: 116/79 116/66  120/69  Pulse:    89  Resp:      Temp:    98.4 F (36.9 C)  TempSrc:    Oral  SpO2: 94% 90%  94%  Weight:   118 kg   Height:        Intake/Output Summary (Last 24 hours) at 12/05/2021 1057 Last data filed at 12/05/2021 0544 Gross per 24 hour  Intake 417 ml  Output 3350 ml  Net -2933 ml   Filed Weights   12/03/21 0501 12/04/21 0407 12/05/21 0542  Weight: 121.2 kg 120.1 kg 118 kg    Examination:  General exam: Pleasant obese female sitting up in bed, AAOx3, no distress HEENT: Positive JVD CVS: S1-S2, regular rhythm Lungs: Few basilar rales Abdomen: Soft, obese, nontender, bowel sounds present, scant abdominal wall edema Extremities: 1+ edema  Skin: No rashes Psychiatry:  Mood & affect appropriate.     Data Reviewed:   CBC: Recent Labs  Lab 11/30/21 1151 12/01/21 2106 12/02/21 0553 12/03/21 0350  WBC 11.1* 12.8* 12.2* 11.4*  NEUTROABS 7.8* 8.9* 8.8*  --   HGB 13.3 13.7 14.5 14.5  HCT 41.4 44.8 45.7 45.3  MCV 89.8 95.7 93.8 93.6  PLT 312.0 333 304 310   Basic Metabolic Panel: Recent Labs  Lab 12/01/21 2106 12/02/21 0553 12/03/21 0350 12/04/21 0332 12/05/21 0231  NA 142 138 139 136 135  K 3.9  3.8 4.2 3.8 3.9  CL 106 102 105 99 97*  CO2 27 26 28 30 29   GLUCOSE 136* 111* 112* 110* 107*  BUN 9 9 10 14 19   CREATININE 0.84 0.72 0.71 0.93 0.94  CALCIUM 9.1 8.8* 8.9 9.1 9.1  MG  --  2.0  --   --   --   PHOS  --  4.7*  --   --   --    GFR: Estimated Creatinine Clearance: 84.4 mL/min (by C-G formula based on SCr of 0.94 mg/dL). Liver Function Tests: Recent Labs  Lab 11/30/21 1151 12/01/21 2106 12/02/21 0553  AST 12 15 15   ALT 15 17 17   ALKPHOS 91 89  86  BILITOT 0.4 0.3 0.4  PROT 7.1 6.8 6.9  ALBUMIN 3.5 3.1* 3.2*   No results for input(s): "LIPASE", "AMYLASE" in the last 168 hours. No results for input(s): "AMMONIA" in the last 168 hours. Coagulation Profile: No results for input(s): "INR", "PROTIME" in the last 168 hours. Cardiac Enzymes: No results for input(s): "CKTOTAL", "CKMB", "CKMBINDEX", "TROPONINI" in the last 168 hours. BNP (last 3 results) No results for input(s): "PROBNP" in the last 8760 hours. HbA1C: No results for input(s): "HGBA1C" in the last 72 hours.  CBG: No results for input(s): "GLUCAP" in the last 168 hours. Lipid Profile: No results for input(s): "CHOL", "HDL", "LDLCALC", "TRIG", "CHOLHDL", "LDLDIRECT" in the last 72 hours.  Thyroid Function Tests: Recent Labs    12/02/21 1937  TSH 1.914   Anemia Panel: No results for input(s): "VITAMINB12", "FOLATE", "FERRITIN", "TIBC", "IRON", "RETICCTPCT" in the last 72 hours. Urine analysis:    Component Value Date/Time   BILIRUBINUR negative 11/30/2021 1341   PROTEINUR Negative 11/30/2021 1341   UROBILINOGEN 0.2 11/30/2021 1341   NITRITE negative 11/30/2021 1341   LEUKOCYTESUR Negative 11/30/2021 1341   Sepsis Labs: @LABRCNTIP (procalcitonin:4,lacticidven:4)  )No results found for this or any previous visit (from the past 240 hour(s)).   Radiology Studies: No results found.   Scheduled Meds:  dapagliflozin propanediol  10 mg Oral Daily   enoxaparin (LOVENOX) injection  60 mg Subcutaneous Q24H   furosemide  80 mg Intravenous BID   ipratropium-albuterol  3 mL Nebulization Q6H   loratadine  10 mg Oral Daily   nicotine  14 mg Transdermal Daily   potassium chloride  20 mEq Oral Daily   spironolactone  12.5 mg Oral Daily   Continuous Infusions:   LOS: 2 days    Time spent: 01/30/2022   01/30/2022, MD [facility]   12/05/2021, 10:57 AM

## 2021-12-06 DIAGNOSIS — I509 Heart failure, unspecified: Secondary | ICD-10-CM | POA: Diagnosis not present

## 2021-12-06 LAB — BASIC METABOLIC PANEL
Anion gap: 9 (ref 5–15)
BUN: 22 mg/dL — ABNORMAL HIGH (ref 6–20)
CO2: 30 mmol/L (ref 22–32)
Calcium: 9.4 mg/dL (ref 8.9–10.3)
Chloride: 96 mmol/L — ABNORMAL LOW (ref 98–111)
Creatinine, Ser: 1.1 mg/dL — ABNORMAL HIGH (ref 0.44–1.00)
GFR, Estimated: 59 mL/min — ABNORMAL LOW (ref >60)
Glucose, Bld: 107 mg/dL — ABNORMAL HIGH (ref 70–99)
Potassium: 4.3 mmol/L (ref 3.5–5.1)
Sodium: 135 mmol/L (ref 135–145)

## 2021-12-06 LAB — MAGNESIUM: Magnesium: 2.3 mg/dL (ref 1.7–2.4)

## 2021-12-06 MED ORDER — FUROSEMIDE 10 MG/ML IJ SOLN
80.0000 mg | Freq: Two times a day (BID) | INTRAMUSCULAR | Status: AC
  Administered 2021-12-06: 80 mg via INTRAVENOUS
  Filled 2021-12-06: qty 8

## 2021-12-06 MED ORDER — FUROSEMIDE 40 MG PO TABS
40.0000 mg | ORAL_TABLET | Freq: Every day | ORAL | Status: DC
  Administered 2021-12-06 – 2021-12-07 (×2): 40 mg via ORAL
  Filled 2021-12-06 (×2): qty 1

## 2021-12-06 NOTE — Progress Notes (Signed)
PROGRESS NOTE    Theresa Marquez  ONG:[id number] DOB: [birth year] DOA: 12/01/2021 PCP: Garnette Gunner, MD  56/F with history of morbid obesity, intermittent edema, tobacco use presented to the ED with worsening dyspnea on exertion, cough and edema ongoing for several months worse in the last 2 weeks.  Chest x-ray noted cardiomegaly, pulmonary edema.  BNP was 33, troponin was negative, albumin is 3.2 -Echo with preserved EF, poor acoustic windows, started on diuretics  Subjective: -Feels better, breathing continues to improve, abdominal tightness has resolved  Assessment and Plan:  Acute diastolic CHF, new onset -Ongoing dyspnea on exertion and swelling for several weeks -2D echo limited by poor acoustic windows, preserved EF, grade 1 diastolic dysfunction, RV function preserved, PA pressures could not be assessed -Diuresing better, 6.4 L negative, weight down 10 pounds in the last 3 days, appears close to euvolemic will transition to oral Lasix today  -Continue Aldactone and SGLT2i  -Mild hypoalbuminemia, SPEP pending, TSH is normal -Heart failure navigator consult, dietitian consult appreciated -Discharge planning, BMP in a.m.  Suspected sleep apnea -Transient apneic episodes witnessed during my encounter with patient 6/7-Started nightly CPAP, TOC unable to set this up at discharge, she will need a sleep study to be approved by insurance, will send pulmonary referral for sleep study at discharge   Chest tightness -Resolved, likely secondary to above, high-sensitivity troponins are negative -Echo with preserved EF, monitor   Morbid obesity BMI 48 -Needs weight loss and lifestyle modification   Hyperglycemia, borderline diabetes -HbA1c is 6.2, SGLT2i added  Leukocytosis -Likely reactive, monitor   Tobacco use disorder Tobacco cessation counseling Nicotine patch  History of recurrent sinusitis -Recommended ENT follow-up at this     DVT prophylaxis: Subcu Lovenox daily    Code Status: Full code   Family Communication: None at bedside Disposition Plan: Home tomorrow if stable  Consultants:    Procedures:   Antimicrobials:    Objective: Vitals:   12/05/21 2000 12/05/21 2101 12/06/21 0523 12/06/21 0527  BP: 109/84   (!) 134/108  Pulse: 100   91  Resp: 20     Temp: 98.3 F (36.8 C)   97.6 F (36.4 C)  TempSrc: Oral   Oral  SpO2: 93% 93%  90%  Weight:   117.2 kg   Height:        Intake/Output Summary (Last 24 hours) at 12/06/2021 1102 Last data filed at 12/06/2021 0526 Gross per 24 hour  Intake 360 ml  Output 1730 ml  Net -1370 ml   Filed Weights   12/04/21 0407 12/05/21 0542 12/06/21 0523  Weight: 120.1 kg 118 kg 117.2 kg    Examination:  General exam: Pleasant obese female laying in bed, AAOx3, no distress HEENT: No JVD CVS: S1-S2, regular rhythm Lungs: Rare basilar rales Abdomen: Soft, obese, nontender, bowel sounds present Extremities: Trace edema  Skin: No rashes Psychiatry:  Mood & affect appropriate.     Data Reviewed:   CBC: Recent Labs  Lab 11/30/21 1151 12/01/21 2106 12/02/21 0553 12/03/21 0350  WBC 11.1* 12.8* 12.2* 11.4*  NEUTROABS 7.8* 8.9* 8.8*  --   HGB 13.3 13.7 14.5 14.5  HCT 41.4 44.8 45.7 45.3  MCV 89.8 95.7 93.8 93.6  PLT 312.0 333 304 310   Basic Metabolic Panel: Recent Labs  Lab 12/02/21 0553 12/03/21 0350 12/04/21 0332 12/05/21 0231 12/06/21 0338  NA 138 139 136 135 135  K 3.8 4.2 3.8 3.9 4.3  CL 102 105 99 97* 96*  CO2  26 28 30 29 30   GLUCOSE 111* 112* 110* 107* 107*  BUN 9 10 14 19  22*  CREATININE 0.72 0.71 0.93 0.94 1.10*  CALCIUM 8.8* 8.9 9.1 9.1 9.4  MG 2.0  --   --   --  2.3  PHOS 4.7*  --   --   --   --    GFR: Estimated Creatinine Clearance: 71.9 mL/min (A) (by C-G formula based on SCr of 1.1 mg/dL (H)). Liver Function Tests: Recent Labs  Lab 11/30/21 1151 12/01/21 2106 12/02/21 0553  AST 12 15 15   ALT 15 17 17   ALKPHOS 91 89 86  BILITOT 0.4 0.3 0.4  PROT 7.1  6.8 6.9  ALBUMIN 3.5 3.1* 3.2*   No results for input(s): "LIPASE", "AMYLASE" in the last 168 hours. No results for input(s): "AMMONIA" in the last 168 hours. Coagulation Profile: No results for input(s): "INR", "PROTIME" in the last 168 hours. Cardiac Enzymes: No results for input(s): "CKTOTAL", "CKMB", "CKMBINDEX", "TROPONINI" in the last 168 hours. BNP (last 3 results) No results for input(s): "PROBNP" in the last 8760 hours. HbA1C: No results for input(s): "HGBA1C" in the last 72 hours.  CBG: No results for input(s): "GLUCAP" in the last 168 hours. Lipid Profile: No results for input(s): "CHOL", "HDL", "LDLCALC", "TRIG", "CHOLHDL", "LDLDIRECT" in the last 72 hours.  Thyroid Function Tests: No results for input(s): "TSH", "T4TOTAL", "FREET4", "T3FREE", "THYROIDAB" in the last 72 hours.  Anemia Panel: No results for input(s): "VITAMINB12", "FOLATE", "FERRITIN", "TIBC", "IRON", "RETICCTPCT" in the last 72 hours. Urine analysis:    Component Value Date/Time   BILIRUBINUR negative 11/30/2021 1341   PROTEINUR Negative 11/30/2021 1341   UROBILINOGEN 0.2 11/30/2021 1341   NITRITE negative 11/30/2021 1341   LEUKOCYTESUR Negative 11/30/2021 1341   Sepsis Labs: @LABRCNTIP (procalcitonin:4,lacticidven:4)  )No results found for this or any previous visit (from the past 240 hour(s)).   Radiology Studies: No results found.   Scheduled Meds:  dapagliflozin propanediol  10 mg Oral Daily   enoxaparin (LOVENOX) injection  60 mg Subcutaneous Q24H   furosemide  40 mg Oral Daily   ipratropium-albuterol  3 mL Nebulization Q6H   loratadine  10 mg Oral Daily   nicotine  14 mg Transdermal Daily   potassium chloride  20 mEq Oral BID   spironolactone  25 mg Oral Daily   Continuous Infusions:   LOS: 3 days    Time spent: 01/30/2022   01/30/2022, MD [facility]   12/06/2021, 11:02 AM

## 2021-12-07 ENCOUNTER — Other Ambulatory Visit (HOSPITAL_COMMUNITY): Payer: Self-pay

## 2021-12-07 LAB — BASIC METABOLIC PANEL
Anion gap: 9 (ref 5–15)
BUN: 21 mg/dL — ABNORMAL HIGH (ref 6–20)
CO2: 24 mmol/L (ref 22–32)
Calcium: 9.2 mg/dL (ref 8.9–10.3)
Chloride: 101 mmol/L (ref 98–111)
Creatinine, Ser: 1.09 mg/dL — ABNORMAL HIGH (ref 0.44–1.00)
GFR, Estimated: 60 mL/min — ABNORMAL LOW (ref >60)
Glucose, Bld: 106 mg/dL — ABNORMAL HIGH (ref 70–99)
Potassium: 4.1 mmol/L (ref 3.5–5.1)
Sodium: 134 mmol/L — ABNORMAL LOW (ref 135–145)

## 2021-12-07 LAB — PROTEIN ELECTROPHORESIS, SERUM
A/G Ratio: 0.9 (ref 0.7–1.7)
Albumin ELP: 3 g/dL (ref 2.9–4.4)
Alpha-1-Globulin: 0.3 g/dL (ref 0.0–0.4)
Alpha-2-Globulin: 0.8 g/dL (ref 0.4–1.0)
Beta Globulin: 1.1 g/dL (ref 0.7–1.3)
Gamma Globulin: 1.3 g/dL (ref 0.4–1.8)
Globulin, Total: 3.5 g/dL (ref 2.2–3.9)
Total Protein ELP: 6.5 g/dL (ref 6.0–8.5)

## 2021-12-07 MED ORDER — POTASSIUM CHLORIDE CRYS ER 20 MEQ PO TBCR
20.0000 meq | EXTENDED_RELEASE_TABLET | Freq: Every day | ORAL | 0 refills | Status: DC
  Filled 2021-12-07: qty 30, 30d supply, fill #0

## 2021-12-07 MED ORDER — ACETAMINOPHEN 325 MG PO TABS: 650.0000 mg | ORAL_TABLET | Freq: Four times a day (QID) | ORAL | Status: DC | PRN

## 2021-12-07 MED ORDER — DAPAGLIFLOZIN PROPANEDIOL 10 MG PO TABS
10.0000 mg | ORAL_TABLET | Freq: Every day | ORAL | 0 refills | Status: DC
  Filled 2021-12-07: qty 30, 30d supply, fill #0

## 2021-12-07 MED ORDER — FUROSEMIDE 40 MG PO TABS
40.0000 mg | ORAL_TABLET | Freq: Every day | ORAL | 0 refills | Status: DC
  Filled 2021-12-07: qty 30, 30d supply, fill #0

## 2021-12-07 MED ORDER — IPRATROPIUM-ALBUTEROL 0.5-2.5 (3) MG/3ML IN SOLN: 3.0000 mL | Freq: Two times a day (BID) | RESPIRATORY_TRACT | Status: DC

## 2021-12-07 MED ORDER — SPIRONOLACTONE 25 MG PO TABS
25.0000 mg | ORAL_TABLET | Freq: Every day | ORAL | 0 refills | Status: DC
  Filled 2021-12-07: qty 30, 30d supply, fill #0

## 2021-12-07 NOTE — Progress Notes (Signed)
Went over discharge paperwork with patient. All questions answered. No PIV present. All belongings at bedside.

## 2021-12-07 NOTE — TOC Transition Note (Signed)
Transition of Care [facility]) - CM/SW Discharge Note   Patient Details  Name: Theresa Marquez MRN: [id number] Date of Birth: [birth year]  [facility] CM/SW Contact:  Leone Haven, RN Phone Number: 12/07/2021, 10:07 AM   Clinical Narrative:    Patient is for dc today has no needs.      Barriers to Discharge: Continued Medical Work up   Patient Goals and CMS Choice   CMS Medicare.gov Compare Post Acute Care list provided to:: Patient    Discharge Placement                       Discharge Plan and Services   Discharge Planning Services: CM Consult, Medication Assistance                                 Social Determinants of Health (SDOH) Interventions Food Insecurity Interventions: Intervention Not Indicated Financial Strain Interventions: Intervention Not Indicated Housing Interventions: Intervention Not Indicated Transportation Interventions: Intervention Not Indicated   Readmission Risk Interventions     No data to display

## 2021-12-07 NOTE — Progress Notes (Signed)
Pt continues to refuse IV placement. Pt has refused IV placement since day shift on 12/06/21 despite education from staff.

## 2021-12-07 NOTE — Progress Notes (Signed)
Heart Failure Stewardship Pharmacist Progress Note   PCP: Garnette Gunner, MD PCP-Cardiologist: None   HPI:   56 y.o. female with medical history significant for severe morbid obesity, bilateral lower extremity edema, tobacco abuse, who presented to [facility] ED from home with complaints of dyspnea with minimal exertion, gradually worsening for the past 2 weeks.  Associated with nonproductive cough, worsening pitting bilateral lower extremity edema, all the way up to her thighs.  Endorses having chest tightness last night that improved at rest.  Ongoing tobacco user, 1 PPD.  Upon presentation to the ED, chest x-ray revealed cardiomegaly and pulmonary edema.  She received 1 dose of IV Lasix 40 mg. 12-lead EKG indicated NSR. BNP 33.3.   Echo on 12/02/2021 revealed EF 55-60% with normal RV function, however study was limited due to poor windows with no prior studies available for comparison.    Discharge HF Medications: Diuretic: furosemide 40 mg daily Aldosterone Antagonist: spironolactone 25 mg daily  SGLT2i: Farxiga 10 mg daily  Prior to admission HF Medications: None  Pertinent Lab Values: Serum creatinine 1.09, BUN 21, Potassium 4.1, Sodium 134, BNP 33.3, Magnesium 2.3, A1c 6.2%  Vital Signs: Weight: 258 lbs (admission weight: 268 lbs) Blood pressure: 110/70s  Heart rate: 70-90s I/O: -2.1L yesterday, net 8.4L   Medication Assistance / Insurance Benefits Check: Does the patient have prescription insurance?  Yes Type of insurance plan: [employer] loss adjuster   Outpatient Pharmacy:  Prior to admission outpatient pharmacy: [facility] Is the patient willing to use [facility] TOC pharmacy at discharge? No Is the patient willing to transition their outpatient pharmacy to utilize a [facility] outpatient pharmacy?   Pending  Assessment: 1. Acute diastolic CHF (LVEF 55-60%), due to unclear etiology. NYHA class II symptoms. -Great diuresis, no JVD and trace edema on exam yesterday, weight  down 10 lbs from admission. Continue furosemide 40 mg PO daily -Lytes above target - K>4 and mag>2.  -Continue spironolactone 25 mg daily -Continue Farxiga 10 mg daily due to HFpEF based on 2023 HFpEF guidelines and A1c of 6.2%.  -Consider adding Entresto 24/26 mg BID if BP ok on follow up   Plan: 1) Medication changes recommended at this time: -Discharge today  2) Patient assistance: Sherryll Burger copay $15 -Farxiga copay $15 -patient given copay cards 12/04/2021  3)  Education  - Patient has been educated on current HF medications and potential additions to HF medication regimen - Patient verbalizes understanding that over the next few months, these medication doses may change and more medications may be added to optimize HF regimen - Patient has been educated on basic disease state pathophysiology and goals of therapy  Sharen Hones, PharmD, BCPS Heart Failure Stewardship Pharmacist Phone [phone]  Please check [email or web address] for unit-specific pharmacist phone numbers

## 2021-12-08 NOTE — Discharge Summary (Signed)
Physician Discharge Summary  Theresa Marquez:[id number] DOB: 05/23/1966 DOA: 12/01/2021  PCP: Theresa Gunner, MD  Admit date: 12/01/2021 Discharge date: 12/07/2021  Time spent: 35 minutes  Recommendations for Outpatient Follow-up:  PCP in 1 week, please arrange sleep study, follow-up SPEP Cardiology TOC heart failure clinic in 1 week   Discharge Diagnoses:  Principal Problem:   Acute CHF (congestive heart failure) (HCC) Acute diastolic CHF Obesity Suspected sleep apnea Borderline diabetes Tobacco use  Discharge Condition: Stable  Diet recommendation: Low-sodium, carb modified  Filed Weights   12/05/21 0542 12/06/21 0523 12/07/21 0354  Weight: 118 kg 117.2 kg 117.4 kg    History of present illness:   56/F with history of morbid obesity, intermittent edema, tobacco use presented to the ED with worsening dyspnea on exertion, cough and edema ongoing for several months worse in the last 2 weeks.  Chest x-ray noted cardiomegaly, pulmonary edema.  BNP was 33, troponin was negative, albumin is 3.2 -Echo with preserved EF, poor acoustic windows, started on diuretics Hospital Course:   Acute diastolic CHF, new onset -Ongoing dyspnea on exertion and swelling for several weeks -2D echo limited by poor acoustic windows, preserved EF, grade 1 diastolic dysfunction, RV function preserved, PA pressures could not be assessed -Diuresing better, 6.4 L negative, weight down 10 pounds in the last 3 days, nodule euvolemic transitioned to oral Lasix -Continue Aldactone and SGLT2i  -Mild hypoalbuminemia, SPEP pending, TSH is normal -Heart failure TOC clinic follow-up arranged   Suspected sleep apnea -Transient apneic episodes witnessed during my encounter with patient 6/7-Started nightly CPAP, TOC unable to set this up at discharge, she will need a sleep study to be approved by insurance, will send pulmonary referral for sleep study at discharge   Chest tightness -Resolved, likely  secondary to above, high-sensitivity troponins are negative -Echo with preserved EF, monitor   Morbid obesity BMI 48 -Needs weight loss and lifestyle modification   Hyperglycemia, borderline diabetes -HbA1c is 6.2, SGLT2i added   Leukocytosis -Likely reactive, monitor   Tobacco use disorder Tobacco cessation counseling Nicotine patch   History of recurrent sinusitis -Recommended ENT follow-up at this     Discharge Exam: Vitals:   12/07/21 0741 12/07/21 0742  BP:  100/77  Pulse:  95  Resp:  20  Temp:  98.5 F (36.9 C)  SpO2: 93% 100%    General exam: Pleasant obese female laying in bed, AAOx3, no distress HEENT: No JVD CVS: S1-S2, regular rhythm Lungs: Rare basilar rales Abdomen: Soft, obese, nontender, bowel sounds present Extremities: Trace edema  Skin: No rashes Psychiatry:  Mood & affect appropriate.     Discharge Instructions   Discharge Instructions     Diet - low sodium heart healthy   Complete by: As directed    Diet Carb Modified   Complete by: As directed    Increase activity slowly   Complete by: As directed       Allergies as of 12/07/2021       Reactions   Codeine Other (See Comments)   Hallucinations  Hallucinates        Medication List     STOP taking these medications    ibuprofen 200 MG tablet Commonly known as: ADVIL       TAKE these medications    acetaminophen 325 MG tablet Commonly known as: TYLENOL Take 2 tablets (650 mg total) by mouth every 6 (six) hours as needed for mild pain, fever or headache.   cetirizine 10 MG  tablet Commonly known as: ZYRTEC Take 10 mg by mouth daily.   Farxiga 10 MG Tabs tablet Generic drug: dapagliflozin propanediol Take 1 tablet (10 mg total) by mouth daily.   furosemide 40 MG tablet Commonly known as: LASIX Take 1 tablet (40 mg total) by mouth daily.   ipratropium 0.06 % nasal spray Commonly known as: ATROVENT Place 2 sprays into both nostrils 2 (two) times daily.    potassium chloride SA 20 MEQ tablet Commonly known as: KLOR-CON M Take 1 tablet (20 mEq total) by mouth daily.   spironolactone 25 MG tablet Commonly known as: ALDACTONE Take 1 tablet (25 mg total) by mouth daily.       Allergies  Allergen Reactions   Codeine Other (See Comments)    Hallucinations  Hallucinates     Follow-up Information     Theresa Gunnerhompson, Aaron B, MD. Go on 12/14/2021.   Specialty: Family Medicine Why: @11 :Azzie Roup00am Contact information: [address] [place] [id number] [phone]         [facility]. Go in 8 day(s).   Specialty: Cardiology Why: Hospital follow up PLEASE bring a current medication list to appointment FREE valet parking, Entrance C, off [address]. Contact information: [address] [id number] [place] [id number] [phone]                 The results of significant diagnostics from this hospitalization (including imaging, microbiology, ancillary and laboratory) are listed below for reference.    Significant Diagnostic Studies: ECHOCARDIOGRAM COMPLETE  Result Date: 12/02/2021    ECHOCARDIOGRAM REPORT   Patient Name:   Theresa Marquez Date of Exam: 12/02/2021 Medical Rec #:  [id number]       Height:       64.0 in Accession #:    [id number]      Weight:       280.6 lb Date of Birth:  09/17/1965       BSA:          2.259 m Patient Age:    56 years        BP:           132/73 mmHg Patient Gender: F               HR:           82 bpm. Exam Location:  Inpatient Procedure: 2D Echo, Cardiac Doppler and Color Doppler Indications:    Chest pain  History:        Patient has no prior history of Echocardiogram examinations.                 CHF.  Sonographer:    Eduard Rouxolleen Schwartz Referring Phys: [id number] Theresa Marquez  Sonographer Comments: Patient is morbidly obese. Image acquisition challenging due to respiratory motion. IMPRESSIONS  1. Limited study due to poor  echo windows.  2. Left ventricular ejection fraction, by estimation, is 55 to 60%. The left ventricle has normal function. Left ventricular endocardial border not optimally defined to evaluate regional wall motion. Left ventricular diastolic parameters are consistent with Grade I diastolic dysfunction (impaired relaxation).  3. Right ventricular systolic function is normal. The right ventricular size is normal. Tricuspid regurgitation signal is inadequate for assessing PA pressure.  4. The mitral valve is normal in structure. Trivial mitral valve regurgitation. No evidence of mitral stenosis.  5. The aortic valve was not well visualized. Aortic valve regurgitation is not visualized. No  aortic stenosis is present.  6. The inferior vena cava is dilated in size with <50% respiratory variability, suggesting right atrial pressure of 15 mmHg.  7. Cannot exclude a small PFO. Comparison(s): No prior Echocardiogram. FINDINGS  Left Ventricle: Left ventricular ejection fraction, by estimation, is 55 to 60%. The left ventricle has normal function. Left ventricular endocardial border not optimally defined to evaluate regional wall motion. The left ventricular internal cavity size was normal in size. There is no left ventricular hypertrophy. Left ventricular diastolic parameters are consistent with Grade I diastolic dysfunction (impaired relaxation). Right Ventricle: The right ventricular size is normal. No increase in right ventricular wall thickness. Right ventricular systolic function is normal. Tricuspid regurgitation signal is inadequate for assessing PA pressure. Left Atrium: Left atrial size was normal in size. Right Atrium: Right atrial size was normal in size. Pericardium: There is no evidence of pericardial effusion. Mitral Valve: The mitral valve is normal in structure. There is mild thickening of the mitral valve leaflet(s). Mild mitral annular calcification. Trivial mitral valve regurgitation. No evidence of mitral  valve stenosis. Tricuspid Valve: The tricuspid valve is normal in structure. Tricuspid valve regurgitation is trivial. Aortic Valve: The aortic valve was not well visualized. Aortic valve regurgitation is not visualized. No aortic stenosis is present. Aortic valve peak gradient measures 14.1 mmHg. Pulmonic Valve: The pulmonic valve was not well visualized. Pulmonic valve regurgitation is trivial. Aorta: The aortic root and ascending aorta are structurally normal, with no evidence of dilitation. Venous: The inferior vena cava is dilated in size with less than 50% respiratory variability, suggesting right atrial pressure of 15 mmHg. IAS/Shunts: Cannot exclude a small PFO.  LEFT VENTRICLE PLAX 2D LVIDd:         6.10 cm Diastology LVIDs:         5.10 cm LV e' medial:    6.90 cm/s LV PW:         0.90 cm LV E/e' medial:  13.5 LV IVS:        1.00 cm LV e' lateral:   9.14 cm/s                        LV E/e' lateral: 10.2  RIGHT VENTRICLE          IVC RV Basal diam:  3.40 cm  IVC diam: 2.30 cm RV Mid diam:    3.20 cm TAPSE (M-mode): 3.0 cm LEFT ATRIUM           Index        RIGHT ATRIUM           Index LA diam:      3.50 cm 1.55 cm/m   RA Area:     21.40 cm LA Vol (A2C): 56.1 ml 24.83 ml/m  RA Volume:   62.50 ml  27.66 ml/m LA Vol (A4C): 57.7 ml 25.54 ml/m  AORTIC VALVE               PULMONIC VALVE AV Vmax:      187.50 cm/s  PV Vmax:       1.32 m/s AV Peak Grad: 14.1 mmHg    PV Peak grad:  7.0 mmHg LVOT Vmax:    166.00 cm/s LVOT Vmean:   114.000 cm/s LVOT VTI:     0.352 m  AORTA Ao Asc diam: 3.30 cm MITRAL VALVE MV Area (PHT): 4.06 cm    SHUNTS MV Decel Time: 187 msec    Systemic VTI: 0.35 m  MV E velocity: 93.00 cm/s MV A velocity: 97.90 cm/s MV E/A ratio:  0.95 Laurance Flatten MD Electronically signed by Laurance Flatten MD Signature Date/Time: 12/02/2021/10:17:01 AM    Final    DG Chest 2 View  Result Date: 12/01/2021 CLINICAL DATA:  Shortness of breath. EXAM: CHEST - 2 VIEW COMPARISON:  11/30/2021. FINDINGS:  Heart is enlarged and the mediastinal contour is stable. The pulmonary vasculature is mildly distended. Interstitial prominence is noted bilaterally. No consolidation, effusion, or pneumothorax. Mild degenerative changes are present in the thoracic spine. IMPRESSION: 1. Cardiomegaly with pulmonary vascular congestion. 2. Interstitial prominence bilaterally possible edema or infiltrate. Electronically Signed   By: Thornell Sartorius M.D.   On: 12/01/2021 21:31   VAS [place] LOWER EXTREMITY VENOUS (DVT)  Result Date: 12/01/2021  Lower Venous DVT Study Patient Name:  AYESHIA COPPIN  Date of Exam:   12/01/2021 Medical Rec #: [id number]        Accession #:    [id number] Date of Birth: 12/13/1965        Patient Gender: F Patient Age:   69 years Exam Location:  [facility] Procedure:      VAS [place] LOWER EXTREMITY VENOUS (DVT) Referring Phys: Fanny Bien --------------------------------------------------------------------------------  Indications: History of lower extremity swelling for several months. Bilateral lower extremity persistent edema and redness x 2 weeks. Patient c/o dyspnea on exertion and with coughing x 2 weeks. Patient reports recent diagnosis of an enlarged heart.  Comparison Study: NA Performing Technologist: Tyna Jaksch RVT  Examination Guidelines: A complete evaluation includes Marquez-mode imaging, spectral Doppler, color Doppler, and power Doppler as needed of all accessible portions of each vessel. Bilateral testing is considered an integral part of a complete examination. Limited examinations for reoccurring indications may be performed as noted. The reflux portion of the exam is performed with the patient in reverse Trendelenburg.  +---------+---------------+---------+-----------+----------+--------------+ RIGHT    CompressibilityPhasicitySpontaneityPropertiesThrombus Aging +---------+---------------+---------+-----------+----------+--------------+ CFV      Full                    Yes                                  +---------+---------------+---------+-----------+----------+--------------+ SFJ      Full                    Yes                                 +---------+---------------+---------+-----------+----------+--------------+ FV Prox  Full                    Yes                                 +---------+---------------+---------+-----------+----------+--------------+ FV Mid   Full                                                        +---------+---------------+---------+-----------+----------+--------------+ FV DistalFull                    Yes                                 +---------+---------------+---------+-----------+----------+--------------+  PFV      Full                    Yes                                 +---------+---------------+---------+-----------+----------+--------------+ POP      Full                    Yes                                 +---------+---------------+---------+-----------+----------+--------------+ PTV      Full                                                        +---------+---------------+---------+-----------+----------+--------------+ PERO     Full                                                        +---------+---------------+---------+-----------+----------+--------------+ Gastroc  Full                                                        +---------+---------------+---------+-----------+----------+--------------+ GSV      Full                    Yes                                 +---------+---------------+---------+-----------+----------+--------------+   Right Technical Findings: Mild pulsatile venous flow noted throughout the lower extremity.  +---------+---------------+---------+-----------+----------+--------------+ LEFT     CompressibilityPhasicitySpontaneityPropertiesThrombus Aging +---------+---------------+---------+-----------+----------+--------------+ CFV       Full                    Yes                                 +---------+---------------+---------+-----------+----------+--------------+ SFJ      Full                    Yes                                 +---------+---------------+---------+-----------+----------+--------------+ FV Prox  Full                    Yes                                 +---------+---------------+---------+-----------+----------+--------------+ FV Mid   Full                                                        +---------+---------------+---------+-----------+----------+--------------+  FV DistalFull                    Yes                                 +---------+---------------+---------+-----------+----------+--------------+ PFV      Full                    Yes                                 +---------+---------------+---------+-----------+----------+--------------+ POP      Full                    Yes                                 +---------+---------------+---------+-----------+----------+--------------+ PTV      Full                                                        +---------+---------------+---------+-----------+----------+--------------+ PERO     Full                                                        +---------+---------------+---------+-----------+----------+--------------+ Gastroc  Full                                                        +---------+---------------+---------+-----------+----------+--------------+ GSV      Full                    Yes                                 +---------+---------------+---------+-----------+----------+--------------+   Left Technical Findings: Mild pulsatile venous flow noted throughout the lower extremity.   Summary: BILATERAL: - No evidence of deep vein thrombosis seen in the lower extremities, bilaterally. - No evidence of superficial venous thrombosis in the lower extremities, bilaterally. -No  evidence of popliteal cyst, bilaterally.  - Mild pulsatile venous flow noted throughout the bilateral lower extremities. Pulsatile lower limb venous Doppler waveform correlates well with increase right atrium pressure; right-sided heart failure.  *See table(s) above for measurements and observations. Electronically signed by Charlton Haws MD on 12/01/2021 at 3:09:09 PM.    Final    DG Chest 2 View  Result Date: 12/01/2021 CLINICAL DATA:  Dyspnea. Hypoxia. Lower extremity edema. EXAM: CHEST - 2 VIEW COMPARISON:  03/20/2015 FINDINGS: Mild cardiomegaly appears increased since previous study. Increased pulmonary vascular congestion is also seen, without evidence of frank pulmonary edema. No evidence of pulmonary consolidation or pleural effusion. IMPRESSION: Increased cardiomegaly and pulmonary vascular congestion. No focal consolidation or pleural effusion. Electronically Signed   By: Dietrich Pates.D.  On: 12/01/2021 08:12    Microbiology: No results found for this or any previous visit (from the past 240 hour(s)).   Labs: Basic Metabolic Panel: Recent Labs  Lab 12/02/21 0553 12/03/21 0350 12/04/21 0332 12/05/21 0231 12/06/21 0338 12/07/21 0307  NA 138 139 136 135 135 134*  K 3.8 4.2 3.8 3.9 4.3 4.1  CL 102 105 99 97* 96* 101  CO2 GLUCOSE 111* 112* 110* 107* 107* 106*  BUN 22* 21*  CREATININE 0.72 0.71 0.93 0.94 1.10* 1.09*  CALCIUM 8.8* 8.9 9.1 9.1 9.4 9.2  MG 2.0  --   --   --  2.3  --   PHOS 4.7*  --   --   --   --   --    Liver Function Tests: Recent Labs  Lab 12/01/21 2106 12/02/21 0553  AST 15 15  ALT 17 17  ALKPHOS 89 86  BILITOT 0.3 0.4  PROT 6.8 6.9  ALBUMIN 3.1* 3.2*   No results for input(s): "LIPASE", "AMYLASE" in the last 168 hours. No results for input(s): "AMMONIA" in the last 168 hours. CBC: Recent Labs  Lab 12/01/21 2106 12/02/21 0553 12/03/21 0350  WBC 12.8* 12.2* 11.4*  NEUTROABS 8.9* 8.8*  --   HGB 13.7 14.5 14.5  HCT  44.8 45.7 45.3  MCV 95.7 93.8 93.6  PLT 333 304 310   Cardiac Enzymes: No results for input(s): "CKTOTAL", "CKMB", "CKMBINDEX", "TROPONINI" in the last 168 hours. BNP: BNP (last 3 results) Recent Labs    12/01/21 2106  BNP 33.3    ProBNP (last 3 results) No results for input(s): "PROBNP" in the last 8760 hours.  CBG: No results for input(s): "GLUCAP" in the last 168 hours.     Signed:  Zannie Cove MD.  [facility] 12/08/2021, 3:32 PM

## 2021-12-10 ENCOUNTER — Telehealth: Payer: Self-pay

## 2021-12-10 NOTE — Telephone Encounter (Signed)
Transition Care Management Unsuccessful Follow-up Telephone Call  Date of discharge and from where:  Theresa Marquez 12/07/2021  Attempts:  1st Attempt  Reason for unsuccessful TCM follow-up call:  No answer/busy

## 2021-12-11 NOTE — Telephone Encounter (Signed)
Transition Care Management Unsuccessful Follow-up Telephone Call  Date of discharge and from where:  12/07/2021 Theresa Marquez   Attempts:  2nd Attempt  Reason for unsuccessful TCM follow-up call:  No answer/busy

## 2021-12-14 ENCOUNTER — Encounter: Payer: Self-pay | Admitting: Family Medicine

## 2021-12-14 ENCOUNTER — Ambulatory Visit (INDEPENDENT_AMBULATORY_CARE_PROVIDER_SITE_OTHER): Payer: Commercial Managed Care - HMO | Admitting: Family Medicine

## 2021-12-14 VITALS — BP 122/78 | HR 64 | Temp 97.8°F | Wt 265.5 lb

## 2021-12-14 DIAGNOSIS — I509 Heart failure, unspecified: Secondary | ICD-10-CM | POA: Diagnosis not present

## 2021-12-14 DIAGNOSIS — R062 Wheezing: Secondary | ICD-10-CM

## 2021-12-14 DIAGNOSIS — N179 Acute kidney failure, unspecified: Secondary | ICD-10-CM | POA: Insufficient documentation

## 2021-12-14 DIAGNOSIS — G4730 Sleep apnea, unspecified: Secondary | ICD-10-CM | POA: Insufficient documentation

## 2021-12-14 DIAGNOSIS — G4733 Obstructive sleep apnea (adult) (pediatric): Secondary | ICD-10-CM

## 2021-12-14 HISTORY — DX: Wheezing: R06.2

## 2021-12-14 HISTORY — DX: Obstructive sleep apnea (adult) (pediatric): G47.33

## 2021-12-14 HISTORY — DX: Acute kidney failure, unspecified: N17.9

## 2021-12-14 LAB — COMPREHENSIVE METABOLIC PANEL WITH GFR
ALT: 23 U/L (ref 0–35)
AST: 17 U/L (ref 0–37)
Albumin: 3.9 g/dL (ref 3.5–5.2)
Alkaline Phosphatase: 91 U/L (ref 39–117)
BUN: 15 mg/dL (ref 6–23)
CO2: 28 meq/L (ref 19–32)
Calcium: 10.2 mg/dL (ref 8.4–10.5)
Chloride: 100 meq/L (ref 96–112)
Creatinine, Ser: 0.94 mg/dL (ref 0.40–1.20)
GFR: 67.92 mL/min (ref >60.00)
Glucose, Bld: 80 mg/dL (ref 70–99)
Potassium: 4.4 meq/L (ref 3.5–5.1)
Sodium: 135 meq/L (ref 135–145)
Total Bilirubin: 0.2 mg/dL (ref 0.2–1.2)
Total Protein: 8 g/dL (ref 6.0–8.3)

## 2021-12-14 MED ORDER — SPIRONOLACTONE 25 MG PO TABS: 25.0000 mg | ORAL_TABLET | Freq: Every day | ORAL | 0 refills | Status: DC

## 2021-12-14 MED ORDER — FUROSEMIDE 40 MG PO TABS: 40.0000 mg | ORAL_TABLET | Freq: Every day | ORAL | 0 refills | Status: DC

## 2021-12-14 MED ORDER — ALBUTEROL SULFATE HFA 108 (90 BASE) MCG/ACT IN AERS: 2.0000 | INHALATION_SPRAY | Freq: Four times a day (QID) | RESPIRATORY_TRACT | 0 refills | Status: DC | PRN

## 2021-12-14 MED ORDER — POTASSIUM CHLORIDE CRYS ER 20 MEQ PO TBCR
20.0000 meq | EXTENDED_RELEASE_TABLET | Freq: Every day | ORAL | 0 refills | Status: DC
Start: 2021-12-14 — End: 2022-05-10

## 2021-12-14 MED ORDER — DAPAGLIFLOZIN PROPANEDIOL 10 MG PO TABS
10.0000 mg | ORAL_TABLET | Freq: Every day | ORAL | 0 refills | Status: DC
Start: 2021-12-14 — End: 2022-02-02

## 2021-12-14 NOTE — Patient Instructions (Signed)
We are referring for sleep study.  For wheezing, we are albuterol.  We are refilling new medications from hospital.  Checking blood work today.

## 2021-12-14 NOTE — Assessment & Plan Note (Signed)
Overall improved Continue Farxiga 10 mg daily Continue Aldactone 25 mg daily Continue Lasix 40 mg daily Continue potassium 20 mill equivalents daily

## 2021-12-14 NOTE — Assessment & Plan Note (Signed)
Lungs clear today, no hypoxia Has used inhalers in the past, likely associated with smoking Cannot rule out ongoing cardiac versus obstructive sleep apnea involvement, however there is low risk associated with trial of albuterol inhaler as needed

## 2021-12-14 NOTE — Assessment & Plan Note (Signed)
Apneic events and hospital requiring CPAP Referral for sleep study

## 2021-12-14 NOTE — Assessment & Plan Note (Signed)
Likely combination of renal strain from heart failure and addition of diuresis Recheck CMP

## 2021-12-14 NOTE — Progress Notes (Addendum)
   Theresa Marquez is a 56 y.o. female who presents today for an office visit.  Assessment/Plan:  New/Acute Problems: none  Chronic Problems Addressed Today: AKI (acute kidney injury) (HCC) Likely combination of renal strain from heart failure and addition of diuresis Recheck CMP  Congestive heart failure (HCC) Overall improved Continue Farxiga 10 mg daily Continue Aldactone 25 mg daily Continue Lasix 40 mg daily Continue potassium 20 mill equivalents daily  OSA (obstructive sleep apnea) Apneic events and hospital requiring CPAP Referral for sleep study  Wheezing Lungs clear today, no hypoxia Has used inhalers in the past, likely associated with smoking Cannot rule out ongoing cardiac versus obstructive sleep apnea involvement, however there is low risk associated with trial of albuterol inhaler as needed      Subjective:  HPI:  12/01/2021 Patient reports with 5-year history of intermittent lower extremity swelling, has been worse over the past few months.  It is worse when standing on her legs for a long time.  Patient also endorses some shortness of breath, however this is associated with globus pharyngeus.  Denies any difficulty swallowing.  Endorses history of chronic rhinitis, improved on ipratropium.  Patient does have significant tobacco use.  12/14/2021 Patient was given work-up outpatient setting for leg swelling.  Did have chest x-ray that showed pulmonary congestion and lower extremity Dopplers that showed venous pulses concerning for heart failure.  Echocardiogram is pending, patient developed worsening chest pain and shortness of breath.  Went to the emergency department.  Patient did have some hypotension and hypoxia.  There labs were obtained that showed negative troponins and normal creatinine, but echocardiogram did show preserved ejection fraction with grade 1 diastolic dysfunction.  Clinically patient appears to be hypervolemic secondary to heart failure.   Patient was admitted and started on diuresis.  Patient did have some mild hyponatremia and hypokalemia, slight increase in creatinine high sensitive troponins were negative, but overall patient improved with IV diuresis.  Patient had weight loss of 10 to 15 pounds.  Patient was referred to cardiology on discharge.  Patient was a started on Farxiga, spironolactone, potassium, Lasix.  Patient did require CPAP at hospital due to apneic events.  Patient reports that overall, she feels improved.  He is no longer having dyspnea, she is having some episodes of shortness of breath at night, but this is also improved.  Today, denies chest pain or shortness of breath in the office.      Objective:  Physical Exam: BP 122/78 (BP Location: Left Arm, Patient Position: Sitting, Cuff Size: Large)   Pulse 64   Temp 97.8 F (36.6 C) (Temporal)   Wt 265 lb 8 oz (120.4 kg)   LMP  (LMP Unknown)   SpO2 97%   BMI 45.57 kg/m   Filed Weights   12/14/21 1102  Weight: 265 lb 8 oz (120.4 kg)    Gen: No acute distress, resting comfortably CV: Regular rate and rhythm with no murmurs appreciated Pulm: Normal work of breathing, right lower crackles in lungs Abdomen: Nontender, nondistended Neuro: Grossly normal, moves all extremities Psych: Normal affect and thought content      Garner Nash, MD, MS

## 2021-12-15 ENCOUNTER — Encounter (HOSPITAL_COMMUNITY): Payer: Commercial Managed Care - HMO

## 2021-12-15 ENCOUNTER — Telehealth (HOSPITAL_COMMUNITY): Payer: Self-pay

## 2021-12-15 NOTE — Telephone Encounter (Signed)
Called to confirm Heart & Vascular Transitions of Care appointment at 2pm. Patient reminded to bring all medications and pill box organizer with them. Confirmed patient has transportation. Gave directions, instructed to utilize valet parking.  Confirmed appointment prior to ending call.

## 2021-12-28 ENCOUNTER — Telehealth (HOSPITAL_COMMUNITY): Payer: Self-pay | Admitting: *Deleted

## 2021-12-28 ENCOUNTER — Encounter (HOSPITAL_COMMUNITY): Payer: Commercial Managed Care - HMO

## 2021-12-28 NOTE — Telephone Encounter (Signed)
Call attempted to confirm HV TOC appt 3 pm on 12/28/21. HIPPA appropriate VM left with callback number.    Rhae Hammock, BSN, Scientist, clinical (histocompatibility and immunogenetics) Only

## 2022-02-01 ENCOUNTER — Institutional Professional Consult (permissible substitution): Payer: Commercial Managed Care - HMO | Admitting: Neurology

## 2022-02-01 ENCOUNTER — Encounter: Payer: Self-pay | Admitting: Neurology

## 2022-02-01 NOTE — Progress Notes (Unsigned)
Cardiology Office Note:    Date:  02/02/2022   ID:  Theresa Marquez, DOB 06-13-1966, MRN [id number]  PCP:  Garnette Gunner, MD  Cardiologist:  Norman Herrlich, MD   Referring MD: Garnette Gunner, MD  ASSESSMENT:    1. Congestive heart failure, unspecified HF chronicity, unspecified heart failure type (HCC)   2. Dyspnea, unspecified type   3. OSA (obstructive sleep apnea)    PLAN:    In order of problems listed above:  She presented predominantly with lower extremity edema 1 month following high-dose steroids.  Her echocardiogram did not support the diagnosis of pulmonary hypertension or heart failure proBNP level was very low and I am not convinced she has what I would call congestive heart failure chest x-ray was read as possible heart failure not definitive her heart is not enlarged on echocardiogram and we have decided to stop her SGLT2 inhibitor take her loop diuretic as needed and remain on spironolactone pending repeat labs today BMP proBNP if she needed further cardiac evaluation I think she would be best served by cardiac MRI.  There are instances of people having heart failure with low BNP level diastolic but its in the context of longstanding hypertension and/or structural infiltrative heart disease not seen here. She has multiple mechanisms including her obstructive sleep apnea heavy cigarette smoking history probable COPD.  I told her to go ahead and get the ENT evaluation with her chronic cough. Pending further evaluation  Next appointment 3 months   Medication Adjustments/Labs and Tests Ordered: Current medicines are reviewed at length with the patient today.  Concerns regarding medicines are outlined above.  Orders Placed This Encounter  Procedures   Basic Metabolic Panel (BMET)   Pro b natriuretic peptide   EKG 12-Lead   Meds ordered this encounter  Medications   furosemide (LASIX) 40 MG tablet    Sig: Take 1 tablet (40 mg total) by mouth daily as needed.     Dispense:  45 tablet    Refill:  3    Chief complaint I was admitted to the hospital for left heart failure  History of Present Illness:    Theresa Marquez is a 56 y.o. female who is being seen today for the evaluation of heart failure at the request of Garnette Gunner, MD. She was admitted to [facility] 12/01/2021 to 12/07/2021 which shortness of breath and edema and diagnosed as heart failure.  She diuresed in hospital weight down 10 pounds over 3 days and was transitioned to oral furosemide and continue Aldactone and SGLT2 inhibitor.  Other problems included suspected sleep apnea chest tightness with a normal high-sensitivity troponin obesity with a BMI of 48.  She also has a 40-pack-year cigarette smoking history.  Echocardiogram 12/02/2021 was technically difficult left ventricular systolic function is normal EF 55 to 60% with grade 1 diastolic dysfunction.  The right ventricle is normal size and function and pulmonary pressure could not be assessed there is no valvular abnormality noted the inferior vena cava was dilated consistent with elevated right atrial and venous pressure. BNP level was quite low at 33 on admission to the hospital.  Chest x-ray showed cardiomegaly and findings of pulmonary vascular congestion.  Read as possible on her second x-ray.  Is a complicated visit it started when she had a recurrent sinus infection 3 separate courses of antibiotics and had a sensation of something being stuck in her throat and fits of coughing where she would be lightheaded and  tells me her oxygen sat would drop in the 80s.  Preceding this in June she had poison ivy and told me she took 3 weeks of high-dose prednisone.  In association although she developed edema of the lower extremities she had an outpatient lower extremity venous duplex that was normal.  She was scheduled to have an outpatient echocardiogram.  She had one of her episodes of coughing paroxysm and was referred to the  emergency room for evaluation.  She has no known history of heart disease congenital rheumatic or atrial fibrillation There was concern she had obstructive sleep apnea and hospital recommended for outpatient evaluation She is also pending ENT evaluation. She has a history of cigarette smoking for 30 years known history of obstructive lung disease does not have a chronic cough but she says at times she wheezes She prefers to sleep with head of bed elevated but she is not having orthopnea PND chest pain palpitation or syncope. Past Medical History:  Diagnosis Date   Allergy    Endometrial polyp 08/09/2017   Formatting of this note might be different from the original. S/p hysteroscopy with [facility] 10/2017 with Dr Elmore Guise, was benign    Past Surgical History:  Procedure Laterality Date   DG GALL BLADDER Right     Current Medications: Current Meds  Medication Sig   acetaminophen (TYLENOL) 325 MG tablet Take 2 tablets (650 mg total) by mouth every 6 (six) hours as needed for mild pain, fever or headache.   albuterol (VENTOLIN HFA) 108 (90 Base) MCG/ACT inhaler Inhale 2 puffs into the lungs every 6 (six) hours as needed for wheezing or shortness of breath.   cetirizine (ZYRTEC) 10 MG tablet Take 10 mg by mouth daily.   furosemide (LASIX) 40 MG tablet Take 1 tablet (40 mg total) by mouth daily as needed.   ipratropium (ATROVENT) 0.06 % nasal spray Place 2 sprays into both nostrils 2 (two) times daily.   potassium chloride SA (KLOR-CON M) 20 MEQ tablet Take 1 tablet (20 mEq total) by mouth daily.   spironolactone (ALDACTONE) 25 MG tablet Take 1 tablet (25 mg total) by mouth daily.   [DISCONTINUED] dapagliflozin propanediol (FARXIGA) 10 MG TABS tablet Take 1 tablet (10 mg total) by mouth daily.   [DISCONTINUED] furosemide (LASIX) 40 MG tablet Take 1 tablet (40 mg total) by mouth daily.     Allergies:   Codeine   Social History   Socioeconomic History   Marital status: Divorced    Spouse name:  Not on file   Number of children: 3   Years of education: Not on file   Highest education level: Associate degree: occupational, Scientist, product/process development, or vocational program  Occupational History   Occupation: [employer]    Comment: Full-Time.  Tobacco Use   Smoking status: Every Day    Packs/day: 1.00    Years: 30.00    Total pack years: 30.00    Types: Cigarettes    Start date: 10/26/1969   Smokeless tobacco: Never   Tobacco comments:    Smoking cessation  Vaping Use   Vaping Use: Never used  Substance and Sexual Activity   Alcohol use: Not Currently    Comment: socially   Drug use: Never   Sexual activity: Not Currently  Other Topics Concern   Not on file  Social History Narrative   Not on file   Social Determinants of Health   Financial Resource Strain: Low Risk  (12/04/2021)   Overall Financial Resource Strain (CARDIA)  Difficulty of Paying Living Expenses: Not very hard  Food Insecurity: No Food Insecurity (12/04/2021)   Hunger Vital Sign    Worried About Running Out of Food in the Last Year: Never true    Ran Out of Food in the Last Year: Never true  Transportation Needs: No Transportation Needs (12/04/2021)   PRAPARE - Administrator, Civil Service (Medical): No    Lack of Transportation (Non-Medical): No  Physical Activity: Not on file  Stress: Not on file  Social Connections: Not on file     Family History: The patient's family history includes Cancer in her father; Cerebral palsy in her daughter; Kidney disease in her mother.  ROS:   ROS Please see the history of present illness.     All other systems reviewed and are negative.  EKGs/Labs/Other Studies Reviewed:    The following studies were reviewed today:   EKG:  EKG is  ordered today.  The ekg ordered today is personally reviewed and demonstrates sinus rhythm 99 bpm low voltage nonspecific ST abnormality no findings hypertrophy atrial enlargement or bundle branch block  Recent Labs: 12/01/2021: B  Natriuretic Peptide 33.3 12/02/2021: TSH 1.914 12/03/2021: Hemoglobin 14.5; Platelets 310 12/06/2021: Magnesium 2.3 12/14/2021: ALT 23; BUN 15; Creatinine, Ser 0.94; Potassium 4.4; Sodium 135  Recent Lipid Panel    Component Value Date/Time   CHOL 179 12/02/2021 0553   TRIG 63 12/02/2021 0553   HDL 40 (L) 12/02/2021 0553   CHOLHDL 4.5 12/02/2021 0553   VLDL 13 12/02/2021 0553   LDLCALC 126 (H) 12/02/2021 0553    Physical Exam:    VS:  BP 100/66   Pulse 99   Ht 5\' 4"  (1.626 m)   Wt 259 lb 1.9 oz (117.5 kg)   SpO2 96%   BMI 44.48 kg/m     Wt Readings from Last 3 Encounters:  02/02/22 259 lb 1.9 oz (117.5 kg)  12/14/21 265 lb 8 oz (120.4 kg)  12/07/21 258 lb 14.4 oz (117.4 kg)     GEN: Obese BMI approaches 45 COPD appearance well nourished, well developed in no acute distress HEENT: Normal NECK: No JVD; No carotid bruits LYMPHATICS: No lymphadenopathy CARDIAC: Distant heart sounds regular rhythm no murmur no gallop RRR, no murmurs, rubs, gallops RESPIRATORY: Hyperinflated diminished breath sounds mild end expiratory wheezing ABDOMEN: Soft, non-tender, non-distended MUSCULOSKELETAL:  No edema; No deformity  SKIN: Warm and dry NEUROLOGIC:  Alert and oriented x 3 PSYCHIATRIC:  Normal affect     Signed, 02/06/22, MD  02/02/2022 5:24 PM    [facility]

## 2022-02-02 ENCOUNTER — Encounter: Payer: Self-pay | Admitting: Cardiology

## 2022-02-02 ENCOUNTER — Ambulatory Visit (INDEPENDENT_AMBULATORY_CARE_PROVIDER_SITE_OTHER): Payer: Commercial Managed Care - HMO | Admitting: Cardiology

## 2022-02-02 ENCOUNTER — Other Ambulatory Visit: Payer: Self-pay

## 2022-02-02 VITALS — BP 100/66 | HR 99 | Ht 64.0 in | Wt 259.1 lb

## 2022-02-02 DIAGNOSIS — R0600 Dyspnea, unspecified: Secondary | ICD-10-CM | POA: Diagnosis not present

## 2022-02-02 DIAGNOSIS — G4733 Obstructive sleep apnea (adult) (pediatric): Secondary | ICD-10-CM | POA: Diagnosis not present

## 2022-02-02 DIAGNOSIS — I509 Heart failure, unspecified: Secondary | ICD-10-CM | POA: Diagnosis not present

## 2022-02-02 MED ORDER — FUROSEMIDE 40 MG PO TABS: 40.0000 mg | ORAL_TABLET | Freq: Every day | ORAL | 3 refills | Status: DC | PRN

## 2022-02-02 NOTE — Patient Instructions (Signed)
Medication Instructions:  Your physician has recommended you make the following change in your medication:   STOP: Farxiga START: Furosemide 40 mg daily as needed  *If you need a refill on your cardiac medications before your next appointment, please call your pharmacy*   Lab Work: Your physician recommends that you return for lab work in:   Labs today: BMP, Pro BNP  If you have labs (blood work) drawn today and your tests are completely normal, you will receive your results only by: MyChart Message (if you have MyChart) OR A paper copy in the mail If you have any lab test that is abnormal or we need to change your treatment, we will call you to review the results.   Testing/Procedures: None   Follow-Up: At [facility], you and your health needs are our priority.  As part of our continuing mission to provide you with exceptional heart care, we have created designated Provider Care Teams.  These Care Teams include your primary Cardiologist (physician) and Advanced Practice Providers (APPs -  Physician Assistants and Nurse Practitioners) who all work together to provide you with the care you need, when you need it.  We recommend signing up for the patient portal called "MyChart".  Sign up information is provided on this After Visit Summary.  MyChart is used to connect with patients for Virtual Visits (Telemedicine).  Patients are able to view lab/test results, encounter notes, upcoming appointments, etc.  Non-urgent messages can be sent to your provider as well.   To learn more about what you can do with MyChart, go to [email or web address].    Your next appointment:   3 month(s)  The format for your next appointment:   In Person  Provider:   Norman Herrlich, MD    Other Instructions None  Important Information About Sugar

## 2022-02-25 ENCOUNTER — Encounter: Payer: Self-pay | Admitting: Family Medicine

## 2022-02-25 ENCOUNTER — Ambulatory Visit (INDEPENDENT_AMBULATORY_CARE_PROVIDER_SITE_OTHER): Payer: Commercial Managed Care - HMO | Admitting: Family Medicine

## 2022-02-25 ENCOUNTER — Ambulatory Visit (INDEPENDENT_AMBULATORY_CARE_PROVIDER_SITE_OTHER): Payer: Commercial Managed Care - HMO

## 2022-02-25 VITALS — BP 130/84 | HR 89 | Temp 97.6°F | Wt 263.8 lb

## 2022-02-25 DIAGNOSIS — M5442 Lumbago with sciatica, left side: Secondary | ICD-10-CM | POA: Diagnosis not present

## 2022-02-25 DIAGNOSIS — G8929 Other chronic pain: Secondary | ICD-10-CM

## 2022-02-25 HISTORY — DX: Other chronic pain: G89.29

## 2022-02-25 MED ORDER — KETOROLAC TROMETHAMINE 30 MG/ML IJ SOLN: 30.0000 mg | Freq: Once | INTRAMUSCULAR | 0 refills | Status: DC

## 2022-02-25 MED ORDER — NAPROXEN 500 MG PO TABS: 500.0000 mg | ORAL_TABLET | Freq: Two times a day (BID) | ORAL | 0 refills | Status: AC

## 2022-02-25 MED ORDER — TIZANIDINE HCL 4 MG PO TABS: 4.0000 mg | ORAL_TABLET | Freq: Four times a day (QID) | ORAL | 0 refills | Status: DC | PRN

## 2022-02-25 MED ORDER — KETOROLAC TROMETHAMINE 60 MG/2ML IM SOLN
30.0000 mg | Freq: Once | INTRAMUSCULAR | Status: AC
  Administered 2022-02-25: 30 mg via INTRAMUSCULAR

## 2022-02-25 NOTE — Progress Notes (Addendum)
Assessment/Plan:   Problem List Items Addressed This Visit       Nervous and Auditory   Chronic midline low back pain with left-sided sciatica - Primary    Patient reports history of intermittent back pain, the worst over the past few days, expresses some leg weakness, although has normal function today, but appears to mostly be limited due to pain Reports history of lumbar disease Repeat lumbar x-rays Toradol given, Naproxen 500 mg twice daily as needed Zanaflex as needed Return and ED precautions discussed      Relevant Medications   tiZANidine (ZANAFLEX) 4 MG tablet   Other Relevant Orders   DG Lumbar Spine Complete (Completed)       Subjective:  HPI:  Theresa Marquez is a 56 y.o. female who has Family history of thyroid disease; Neck swelling; Obesity, morbid, BMI 40.0-49.9 (HCC); Tobacco abuse; Bilateral lower extremity edema; Recurrent sinus infections; Rhinitis; Congestive heart failure (HCC); Acute exacerbation of CHF (congestive heart failure) (HCC); OSA (obstructive sleep apnea); AKI (acute kidney injury) (HCC); Wheezing; and Chronic midline low back pain with left-sided sciatica on their problem list..   She  has a past medical history of Allergy and Endometrial polyp (08/09/2017)..   She presents with chief complaint of Back Pain (Sharp pain in lower back pain and right and left leg pain causing weakness and tingling in right foot and weakness in the left leg.) .   Back Pain  She reports chronic back pain. There was an injury that may have caused the pain, patient said that she was at work lifting and felt pain.  Reports that she has a history of "disc disease "in her lower back.  The most recent episode started about a week ago and is worsening. The pain is located in the mid lumber with radiation, to the left thigh. It is described as sharp, is moderate in intensity, occurring constantly. Symptoms are worse in the: all day.  Multiple CT abdomen pelvis is in 2021  through 2022, no disc disease mentioned, although not specifically looking for lower back issues at that time. Aggravating factors: bending backwards, bending forwards, sitting, and standing Relieving factors: changing postiions, sitting and standing .  She has tried application of ice and acetaminophen with little relief.   Associated symptoms: No abdominal pain No bowel incontinence  No chest pain No dysuria   No fever No headaches  No joint pains No pelvic pain  Yes weakness in leg, left leg, intermittant, feels like weakness is associated with pain  No tingling in lower extremities  No urinary incontinence No weight loss    -----------------------------------------------------------------------------------------   Past Surgical History:  Procedure Laterality Date   DG GALL BLADDER Right     Outpatient Medications Prior to Visit  Medication Sig Dispense Refill   acetaminophen (TYLENOL) 325 MG tablet Take 2 tablets (650 mg total) by mouth every 6 (six) hours as needed for mild pain, fever or headache. (Patient not taking: Reported on 03/16/2022)     albuterol (VENTOLIN HFA) 108 (90 Base) MCG/ACT inhaler Inhale 2 puffs into the lungs every 6 (six) hours as needed for wheezing or shortness of breath. 8 g 0   cetirizine (ZYRTEC) 10 MG tablet Take 10 mg by mouth daily.     furosemide (LASIX) 40 MG tablet Take 1 tablet (40 mg total) by mouth daily as needed. 45 tablet 3   ipratropium (ATROVENT) 0.06 % nasal spray Place 2 sprays into both nostrils 2 (two) times daily.  12 mL 2   potassium chloride SA (KLOR-CON M) 20 MEQ tablet Take 1 tablet (20 mEq total) by mouth daily. 30 tablet 0   spironolactone (ALDACTONE) 25 MG tablet Take 1 tablet (25 mg total) by mouth daily. 90 tablet 0   No facility-administered medications prior to visit.    Family History  Problem Relation Age of Onset   Kidney disease Mother    Cancer Father    Cerebral palsy Daughter     Social History    Socioeconomic History   Marital status: Divorced    Spouse name: Not on file   Number of children: 3   Years of education: Not on file   Highest education level: Associate degree: occupational, Scientist, product/process development, or vocational program  Occupational History   Occupation: [employer]    Comment: Full-Time.  Tobacco Use   Smoking status: Every Day    Packs/day: 1.00    Years: 30.00    Total pack years: 30.00    Types: Cigarettes    Start date: 10/26/1969   Smokeless tobacco: Never   Tobacco comments:    Smoking cessation  Vaping Use   Vaping Use: Never used  Substance and Sexual Activity   Alcohol use: Not Currently    Comment: socially   Drug use: Never   Sexual activity: Not Currently  Other Topics Concern   Not on file  Social History Narrative   Not on file   Social Determinants of Health   Financial Resource Strain: Low Risk  (12/04/2021)   Overall Financial Resource Strain (CARDIA)    Difficulty of Paying Living Expenses: Not very hard  Food Insecurity: No Food Insecurity (12/04/2021)   Hunger Vital Sign    Worried About Running Out of Food in the Last Year: Never true    Ran Out of Food in the Last Year: Never true  Transportation Needs: No Transportation Needs (12/04/2021)   PRAPARE - Administrator, Civil Service (Medical): No    Lack of Transportation (Non-Medical): No  Physical Activity: Not on file  Stress: Not on file  Social Connections: Not on file  Intimate Partner Violence: Not on file                                                                                                 Objective:  Physical Exam: BP 130/84 (BP Location: Left Arm, Patient Position: Sitting, Cuff Size: Large)   Pulse 89   Temp 97.6 F (36.4 C) (Temporal)   Wt 263 lb 12.8 oz (119.7 kg)   LMP  (LMP Unknown)   SpO2 95%   BMI 45.28 kg/m    General: No acute distress. Awake and conversant.  Eyes: Normal conjunctiva, anicteric. Round symmetric pupils.  ENT: Hearing grossly  intact. No nasal discharge.  Neck: Neck is supple. No masses or thyromegaly.  Respiratory: Respirations are non-labored. No auditory wheezing.  Skin: Warm. No rashes or ulcers.  Psych: Alert and oriented. Cooperative, Appropriate mood and affect, Normal judgment.  CV: No cyanosis or JVD MSK: EXTR without limp, although does have difficulty getting up out of  chair, right midline lumbar mildly tender, normal strength in lower extremities,  neuro: Sensation and CN II-XII grossly normal.        Alesia Banda, MD, MS

## 2022-02-25 NOTE — Progress Notes (Unsigned)
Initial visit for LBP x 2 weeks Pt noticed pain after picking up a heavy case of water bottles while working. She worked the rest of the day and the following too. By the end of the day patient could not lift leg to walk. She has fallen 3 times in the past 2 weeks. "My leg just stops working"

## 2022-02-25 NOTE — Assessment & Plan Note (Signed)
Patient reports history of intermittent back pain, the worst over the past few days, expresses some leg weakness, although has normal function today, but appears to mostly be limited due to pain Reports history of lumbar disease Repeat lumbar x-rays Toradol given, Naproxen 500 mg twice daily as needed Zanaflex as needed Return and ED precautions discussed

## 2022-03-16 ENCOUNTER — Encounter: Payer: Self-pay | Admitting: Family Medicine

## 2022-03-16 ENCOUNTER — Ambulatory Visit (INDEPENDENT_AMBULATORY_CARE_PROVIDER_SITE_OTHER): Payer: Commercial Managed Care - HMO | Admitting: Family Medicine

## 2022-03-16 VITALS — BP 130/84 | HR 75 | Temp 97.4°F | Wt 271.6 lb

## 2022-03-16 DIAGNOSIS — M5442 Lumbago with sciatica, left side: Secondary | ICD-10-CM | POA: Diagnosis not present

## 2022-03-16 DIAGNOSIS — G8929 Other chronic pain: Secondary | ICD-10-CM | POA: Diagnosis not present

## 2022-03-16 MED ORDER — KETOROLAC TROMETHAMINE 10 MG PO TABS: 10.0000 mg | ORAL_TABLET | Freq: Four times a day (QID) | ORAL | 0 refills | Status: DC | PRN

## 2022-03-16 MED ORDER — CYCLOBENZAPRINE HCL 10 MG PO TABS: 10.0000 mg | ORAL_TABLET | Freq: Three times a day (TID) | ORAL | 0 refills | Status: DC | PRN

## 2022-03-16 NOTE — Assessment & Plan Note (Signed)
Improvement minimal, less weakness on left leg, although still persistent numbness Did have some mild improvement with Toradol, however no improvement with Zanaflex Trial Flexeril and oral Toradol as alternative therapies Discussed risk and benefit and limitations additional treatment, including PT, chiropractic, including additional imaging including CT and MRI, or referral to orthopedics,  patient says she cannot afford to do PT or chiropractic at this moment, nor can she afford to do a further imaging, she is open to seeing orthopedics

## 2022-03-16 NOTE — Progress Notes (Signed)
Assessment/Plan:   Problem List Items Addressed This Visit       Nervous and Auditory   Chronic midline low back pain with left-sided sciatica - Primary    Improvement minimal, less weakness on left leg, although still persistent numbness Did have some mild improvement with Toradol, however no improvement with Zanaflex Trial Flexeril and oral Toradol as alternative therapies Discussed risk and benefit and limitations additional treatment, including PT, chiropractic, including additional imaging including CT and MRI, or referral to orthopedics,  patient says she cannot afford to do PT or chiropractic at this moment, nor can she afford to do a further imaging, she is open to seeing orthopedics       Relevant Medications   cyclobenzaprine (FLEXERIL) 10 MG tablet   ketorolac (TORADOL) 10 MG tablet   Other Relevant Orders   Ambulatory referral to Orthopedics         Subjective:  HPI:  Theresa Marquez is a 56 y.o. female who has Family history of thyroid disease; Neck swelling; Obesity, morbid, BMI 40.0-49.9 (HCC); Tobacco abuse; Bilateral lower extremity edema; Recurrent sinus infections; Rhinitis; Congestive heart failure (HCC); Acute exacerbation of CHF (congestive heart failure) (HCC); OSA (obstructive sleep apnea); AKI (acute kidney injury) (HCC); Wheezing; and Chronic midline low back pain with left-sided sciatica on their problem list..   She  has a past medical history of Allergy and Endometrial polyp (08/09/2017)..   She presents with chief complaint of Follow-up (Back pain follow up. Pain is still the same ) .    03/16/2022 Update  Overall her back pain is the same.  Patient reports that she had some relief from the Toradol that was given IM.  This only lasted through the day.  She reports that the Zanaflex helped minimally.  Patient reports that pain has caused difficulty sleeping no relief status.  Patient does report that she has had some left leg numbness, but she  denies any weakness at this moment.  Patient denies any saddle anesthesia, bowel or bladder incontinence, fevers or chills.  X-ray on 02/25/2022 showed mild degenerative changes of the lower back without acute findings.  02/25/22 Back Pain  She reports chronic back pain. There was an injury that may have caused the pain, patient said that she was at work lifting and felt pain.  Reports that she has a history of "disc disease "in her lower back.  The most recent episode started about a week ago and is worsening. The pain is located in the mid lumber with radiation, to the left thigh. It is described as sharp, is moderate in intensity, occurring constantly. Symptoms are worse in the: all day.  Multiple CT abdomen pelvis is in 2021 through 2022, no disc disease mentioned, although not specifically looking for lower back issues at that time. Aggravating factors: bending backwards, bending forwards, sitting, and standing Relieving factors: changing postiions, sitting and standing .  She has tried application of ice and acetaminophen with little relief.   Associated symptoms: No abdominal pain No bowel incontinence  No chest pain No dysuria   No fever No headaches  No joint pains No pelvic pain  Yes weakness in leg, left leg, intermittant, feels like weakness is associated with pain  No tingling in lower extremities  No urinary incontinence No weight loss    -----------------------------------------------------------------------------------------   Past Surgical History:  Procedure Laterality Date   DG GALL BLADDER Right     Outpatient Medications Prior to Visit  Medication Sig Dispense  Refill   albuterol (VENTOLIN HFA) 108 (90 Base) MCG/ACT inhaler Inhale 2 puffs into the lungs every 6 (six) hours as needed for wheezing or shortness of breath. 8 g 0   cetirizine (ZYRTEC) 10 MG tablet Take 10 mg by mouth daily.     furosemide (LASIX) 40 MG tablet Take 1 tablet (40 mg total) by mouth daily  as needed. 45 tablet 3   tiZANidine (ZANAFLEX) 4 MG tablet Take 1 tablet (4 mg total) by mouth every 6 (six) hours as needed for muscle spasms. 30 tablet 0   acetaminophen (TYLENOL) 325 MG tablet Take 2 tablets (650 mg total) by mouth every 6 (six) hours as needed for mild pain, fever or headache. (Patient not taking: Reported on 03/16/2022)     ipratropium (ATROVENT) 0.06 % nasal spray Place 2 sprays into both nostrils 2 (two) times daily. 12 mL 2   potassium chloride SA (KLOR-CON M) 20 MEQ tablet Take 1 tablet (20 mEq total) by mouth daily. 30 tablet 0   spironolactone (ALDACTONE) 25 MG tablet Take 1 tablet (25 mg total) by mouth daily. 90 tablet 0   No facility-administered medications prior to visit.    Family History  Problem Relation Age of Onset   Kidney disease Mother    Cancer Father    Cerebral palsy Daughter     Social History   Socioeconomic History   Marital status: Divorced    Spouse name: Not on file   Number of children: 3   Years of education: Not on file   Highest education level: Associate degree: occupational, Hotel manager, or vocational program  Occupational History   Occupation: [employer]    Comment: Full-Time.  Tobacco Use   Smoking status: Every Day    Packs/day: 1.00    Years: 30.00    Total pack years: 30.00    Types: Cigarettes    Start date: 10/26/1969   Smokeless tobacco: Never   Tobacco comments:    Smoking cessation  Vaping Use   Vaping Use: Never used  Substance and Sexual Activity   Alcohol use: Not Currently    Comment: socially   Drug use: Never   Sexual activity: Not Currently  Other Topics Concern   Not on file  Social History Narrative   Not on file   Social Determinants of Health   Financial Resource Strain: Low Risk  (12/04/2021)   Overall Financial Resource Strain (CARDIA)    Difficulty of Paying Living Expenses: Not very hard  Food Insecurity: No Food Insecurity (12/04/2021)   Hunger Vital Sign    Worried About Running Out of Food  in the Last Year: Never true    Ran Out of Food in the Last Year: Never true  Transportation Needs: No Transportation Needs (12/04/2021)   PRAPARE - Hydrologist (Medical): No    Lack of Transportation (Non-Medical): No  Physical Activity: Not on file  Stress: Not on file  Social Connections: Not on file  Intimate Partner Violence: Not on file  Objective:  Physical Exam: BP 130/84 (BP Location: Left Arm, Patient Position: Sitting, Cuff Size: Large)   Pulse 75   Temp (!) 97.4 F (36.3 C) (Temporal)   Wt 271 lb 9.6 oz (123.2 kg)   LMP  (LMP Unknown)   SpO2 95%   BMI 46.62 kg/m    General: No acute distress. Awake and conversant.  Eyes: Normal conjunctiva, anicteric. Round symmetric pupils.  ENT: Hearing grossly intact. No nasal discharge.  Neck: Neck is supple. No masses or thyromegaly.  Respiratory: Respirations are non-labored. No auditory wheezing.  Skin: Warm. No rashes or ulcers.  Psych: Alert and oriented. Cooperative, Appropriate mood and affect, Normal judgment.  CV: No cyanosis or JVD MSK: Ambulates without limp, right of midline lumbar mildly tender, normal strength in lower extremities,  neuro: Sensation and CN II-XII grossly normal.   Garnette Gunner, MD

## 2022-03-24 ENCOUNTER — Other Ambulatory Visit: Payer: Self-pay | Admitting: Family Medicine

## 2022-03-24 DIAGNOSIS — I509 Heart failure, unspecified: Secondary | ICD-10-CM

## 2022-03-25 ENCOUNTER — Ambulatory Visit (INDEPENDENT_AMBULATORY_CARE_PROVIDER_SITE_OTHER): Payer: Commercial Managed Care - HMO

## 2022-03-25 ENCOUNTER — Encounter: Payer: Self-pay | Admitting: Orthopedic Surgery

## 2022-03-25 ENCOUNTER — Ambulatory Visit: Payer: Self-pay

## 2022-03-25 ENCOUNTER — Ambulatory Visit (INDEPENDENT_AMBULATORY_CARE_PROVIDER_SITE_OTHER): Payer: Commercial Managed Care - HMO | Admitting: Orthopedic Surgery

## 2022-03-25 VITALS — BP 120/81 | HR 91 | Ht 64.0 in | Wt 272.0 lb

## 2022-03-25 DIAGNOSIS — M4712 Other spondylosis with myelopathy, cervical region: Secondary | ICD-10-CM | POA: Diagnosis not present

## 2022-03-25 DIAGNOSIS — G8929 Other chronic pain: Secondary | ICD-10-CM

## 2022-03-25 DIAGNOSIS — M5442 Lumbago with sciatica, left side: Secondary | ICD-10-CM | POA: Diagnosis not present

## 2022-03-25 NOTE — Progress Notes (Signed)
Orthopedic Spine Surgery Office Note  Patient name: Theresa Marquez Patient MRN: [id number] Date of visit: 03/25/22  History:  Patient is a 56 year old female who presents today for her lumbar spine Pain location: lower back, left hip, and back of her left leg   Severity of pain: severe, has had to stop working because of the pain Mechanism of injury: Believes it started from her work where she was doing repetitive twisting, lifting, and pushing carts Duration of symptoms: since July 2023 Radicular symptoms: yes, goes from her back into the back of her hip and all the way down the back of her leg Weakness: denies Difficulty with fine motor skills (e.g., buttoning shirts, handwriting): yes, has noted that she is occasionally dropping objects unintentionally and has had trouble with handwriting Symptoms of imbalance: yes, feels unsteady and like her legs are giving out under her Paresthesias and numbness: yes, in the same distribution as the pain in her left leg Bowel or bladder incontinence: denies Saddle anesthesia: denies Has had LBP in the past and chiropractor helped, but this time it is not getting better like before    Treatments tried: toradol, tramadol, activity modification, flexeril  Review of systems: Denies fevers and chills, night sweats, unexplained weight loss, history of cancer  Past medical history: CHF Recurrent sinus infections OSA  Allergies: codeine, prednisone  Past surgical history:  Cholecystectomy   Social history: Reports use of nicotine product (smoking, vaping, patches, smokeless) -currently smoking 0.5ppd Denies recreational drug use   Physical Exam:  General: no acute distress, appears stated age Neurologic: alert, answering questions appropriately, following commands Respiratory: unlabored breathing on room air, symmetric chest rise Psychiatric: appropriate affect, normal cadence to speech   MSK (spine):  -Strength  exam      Left  Right Grip strength                5/5  5/5 Interosseus   4/5   4/5 Wrist extension  5/5  5/5 Wrist flexion   5/5  5/5 Elbow flexion   5/5  5/5 Deltoid    5/5  5/5  EHL    5/5  4/5 TA    5/5  5/5 GSC    5/5  5/5 Knee extension  5/5  5/5 Hip flexion   5/5  5/5  -Sensory exam    Sensation intact to light touch in L3-S1 nerve distributions of bilateral lower extremities  Sensation intact to light touch in C5-T1 nerve distributions of bilateral upper extremities  -Brachioradialis DTR: 2/4 on the left, 2/4 on the right -Biceps DTR: 2/4 on the left, 2/4 on the right -Achilles DTR: 3/4 on the left, 2/4 on the right -Patellar tendon DTR: 1/4 on the left, 1/4 on the right  -Spurling: negative -Hoffman sign: positive on the right, negative on the left -Inverted brachioradialis reflex: negative -Interosseous atrophy: none -Grip and release test: positive -Clonus: no beats on either side -Romberg: positive -Imbalance with gait: wide based, slow -Imbalance with tandem gait: yes  Imaging: XR of lumbar spine from 03/25/2022 and 02/25/2022 was independently reviewed and interpreted, showing disc height loss at L5/S1, no evidence of instability, no acute osseous abnormality  XR of the cervical spine from 03/25/2022 was independently reviewed and interpreted, showing disc height loss and osteophyte formation at C5/6 and C6/7, no evidence of instability, no acute osseous abnormality   Assessment: Patient is a 56 year old female with radicular lumbar back pain that seems to be in an  L5 distribution, however, she also happen to mention some symptoms consistent with myelopathy and on further exam has signs consistent with it as well   Plan: -I recommended she do PT for her lower back and radicular pain, but she is not interested in doing that at this time. She is concerned about the expense especially since she is currently out of work. -I further recommended she lose weight.  Explained how her increased weight puts increased stress on her back and should surgery ever be a part of her treatment, it would significantly increase the potential for complications. She expressed understanding is going to keep working to lose weight.  -Discussed the risks of continued smoking and encouraged her to quit. She has cut down and is down to 0.5ppd. She should continue her efforts to quit.  -She should use over the counter pain medications -Given her symptoms and physical exam findings that are concerning or cervical myelopathy, I ordered a cervical MRI -Should should follow up after that MRI to discuss the results and go over the next steps in management. No new XR are needed at that next visit.   Willia Craze, MD Orthopedic Surgeon

## 2022-04-06 ENCOUNTER — Encounter: Payer: Self-pay | Admitting: Physical Therapy

## 2022-04-06 ENCOUNTER — Ambulatory Visit: Payer: Commercial Managed Care - HMO | Attending: Orthopedic Surgery | Admitting: Physical Therapy

## 2022-04-06 DIAGNOSIS — M5442 Lumbago with sciatica, left side: Secondary | ICD-10-CM | POA: Insufficient documentation

## 2022-04-06 DIAGNOSIS — R262 Difficulty in walking, not elsewhere classified: Secondary | ICD-10-CM | POA: Diagnosis present

## 2022-04-06 DIAGNOSIS — G8929 Other chronic pain: Secondary | ICD-10-CM | POA: Insufficient documentation

## 2022-04-06 DIAGNOSIS — M6281 Muscle weakness (generalized): Secondary | ICD-10-CM | POA: Diagnosis present

## 2022-04-06 DIAGNOSIS — R252 Cramp and spasm: Secondary | ICD-10-CM | POA: Insufficient documentation

## 2022-04-06 NOTE — Therapy (Signed)
OUTPATIENT PHYSICAL THERAPY THORACOLUMBAR EVALUATION   Patient Name: Theresa Marquez MRN: [id number] DOB:1966/02/21, 56 y.o., female Today's Date: 04/06/2022   PT End of Session - 04/06/22 1524     Visit Number 1    Number of Visits 12    Date for PT Re-Evaluation 05/18/22    Authorization Type [employer] [id number]    Authorization Time Period authorization needed after 5th visit    PT Start Time 1515    PT Stop Time 1600    PT Time Calculation (min) 45 min    Activity Tolerance Patient tolerated treatment well    Behavior During Therapy [facility] for tasks assessed/performed             Past Medical History:  Diagnosis Date   Allergy    Endometrial polyp 08/09/2017   Formatting of this note might be different from the original. S/p hysteroscopy with D&C 10/2017 with Dr Elmore Guise, was benign   Past Surgical History:  Procedure Laterality Date   DG GALL BLADDER Right    Patient Active Problem List   Diagnosis Date Noted   Chronic midline low back pain with left-sided sciatica 02/25/2022   OSA (obstructive sleep apnea) 12/14/2021   AKI (acute kidney injury) (HCC) 12/14/2021   Wheezing 12/14/2021   Acute exacerbation of CHF (congestive heart failure) (HCC) 12/03/2021   Congestive heart failure (HCC) 12/02/2021   Rhinitis 11/30/2021   Obesity, morbid, BMI 40.0-49.9 (HCC) 11/10/2021   Tobacco abuse 11/10/2021   Bilateral lower extremity edema 11/10/2021   Recurrent sinus infections 11/10/2021   Family history of thyroid disease 10/05/2021   Neck swelling 10/05/2021    PCP: Garnette Gunner MD  REFERRING PROVIDER: London Sheer, MD  REFERRING DIAG: [id number] (ICD-10-CM) - Chronic midline low back pain with left-sided sciatica   Rationale for Evaluation and Treatment Rehabilitation  THERAPY DIAG:  Chronic midline low back pain with left-sided sciatica  Muscle weakness (generalized)  Difficulty in walking, not elsewhere classified  Cramp and spasm  ONSET DATE:  July 2023  SUBJECTIVE:                                                                                                                                                                                           SUBJECTIVE STATEMENT: Used to work in Engineering geologist, 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  Most of the carts where broken so ended up twisting body in odd position causing pain.  Since August has been unable to work.  Waiting to hear about MRI for neck as has numbness in both hands, feels off balance,  and has numbness/pain all the way down her LLE, and numbness/pain in R foot.   PERTINENT HISTORY:  OSA, obesity, tobacco abuse, bil LE edema - diagnosed with CHF but cardiologist suspects COPD  PAIN:  Are you having pain? Yes: NPRS scale: 7-8/10 Pain location:  midline low back radiating down LLE to foot Pain description: constant burning, intermittent sharp stabbing shooting pains down L leg, legs feel weak Aggravating factors: twisting, moving, standing up Relieving factors: changing position   PRECAUTIONS: Fall  WEIGHT BEARING RESTRICTIONS No  FALLS:  Has patient fallen in last 6 months? Yes. Number of falls 4 -- 2 knees buckling getting out of bed, 2 where feel like she looses all muscle control and slumps.   LIVING ENVIRONMENT: Lives with: lives with their daughter Lives in: House/apartment Stairs: Yes: Internal: 16 steps; on right going up, on left going up, and can reach both to basement Has following equipment at home: Quad cane small base and None  equipment in attic from parents.  Home set up to be handicap accessible.   OCCUPATION: unable to work currently  PLOF: Independent  PATIENT GOALS decrease back pain   OBJECTIVE:   DIAGNOSTIC FINDINGS:  XR of lumbar spine from 03/25/2022 and 02/25/2022 was independently reviewed and interpreted, showing disc height loss at L5/S1, no evidence of instability, no acute osseous abnormality  PATIENT  SURVEYS:  Modified Oswestry 34/50 = 68% crippling disability    SCREENING FOR RED FLAGS: Bowel or bladder incontinence: Yes: some bladder weakness - stress incontinence last 2 weaks Spinal tumors: none on Xrays, no MRI Cauda equina syndrome: No Compression fracture: No Abdominal aneurysm: No  COGNITION:  Overall cognitive status: Within functional limits for tasks assessed     SENSATION: Intact to light touch, reports slightly stronger in LLE, reports toes feel numb especially L foot, reports skin changes, circulation has been checked in LLE.    MUSCLE LENGTH: Hamstrings: Right ~75 deg; Left ~ 30 deg  POSTURE: rounded shoulders, forward head, and weight shift left  PALPATION: Non-pitting edema bil LE  LUMBAR ROM:   Active  A/PROM  eval  Flexion Limited 90% by pain  Extension WNL, decreased pain  Right lateral flexion To knee  Left lateral flexion To mid thigh  Right rotation WNL  Left rotation WNL   (Blank rows = not tested)  LOWER EXTREMITY ROM:   good hip ER bil, limited IR, L more painful and tighter than R.    LOWER EXTREMITY MMT:    MMT Right eval Left eval  Hip flexion 4+ 4+  Hip extension 4 3  Hip abduction 4+ 4+  Hip adduction 4+ 4+   Knee flexion 5 tremorous 5 tremorous  Knee extension 5 tremorous 5 tremorous  Ankle dorsiflexion 4 4  Ankle plantarflexion 4 4   (Blank rows = not tested)  LUMBAR SPECIAL TESTS:  Straight leg raise test: Positive and Slump test: Positive SLR on L increased pain 45 deg, R to 75 deg with hamstring tightness.   FUNCTIONAL TESTS:  5 times sit to stand: 44 seconds, with w/s to LLE to stand up. Increased pain.   GAIT: Distance walked: 13'  Assistive device utilized: None Level of assistance: Complete Independence Comments:  slow gait speed ~ 0.75 m/s    TODAY'S TREATMENT  Therapeutic Exercise: to improve strength and mobility.  Demo, verbal and tactile cues throughout for technique. Prone x 2 min Prone knee  bends x 10 bil Supine - LTR x 5 bil  PPT x 10 Demo - prone extension at wall and side bending.    PATIENT EDUCATION:  Education details: findings, POC, initial HEP Person educated: Patient Education method: Explanation, Demonstration, Verbal cues, and Handouts Education comprehension: verbalized understanding and returned demonstration   HOME EXERCISE PROGRAM: Access Code: [email or web address] URL: [email or web address] Date: 04/06/2022 Prepared by: Glenetta Hew  Exercises - Supine Lower Trunk Rotation  - 1 x daily - 7 x weekly - 1 sets - 10 reps - Supine Posterior Pelvic Tilt  - 1 x daily - 7 x weekly - 2 sets - 10 reps - 5 sec hold - Standing Lumbar Extension at [place]  - 3 x daily - 7 x weekly - 1 sets - 10 reps - Lying Prone with 2 Pillows  - 1 x daily - 7 x weekly - 3 minutes hold  ASSESSMENT:  CLINICAL IMPRESSION: Theresa Marquez is a 56 y.o. female  who was seen today for physical therapy evaluation and treatment for chronic LBP with L sided sciatica.  She reports 68% disability on Oswestry and has had to stop working due to pain.  She is waiting for cervical MRI, as she is also reporting impairments in balance and what sounds like drop attacks.  She also has numbness in R foot, but reports radicular symptoms down LLE.  Today she demonstrated very guarded movements even with gait, noted left lateral shift with sit to stand transitions, she demonstrated strong extension preference in standing, but poor tolerance to prone extension.  Given initial HEP for extension and neutral spine.  Jolayne Haines would benefit from skilled physical therapy to decrease pain, improve activity tolerance and safety.   Will also contact referring provider about red flags to see if more imaging needed.   OBJECTIVE IMPAIRMENTS Abnormal gait, decreased activity tolerance, decreased balance, decreased endurance, decreased mobility, difficulty walking, decreased ROM, decreased strength,  increased fascial restrictions, impaired perceived functional ability, increased muscle spasms, impaired flexibility, impaired sensation, postural dysfunction, and pain.   ACTIVITY LIMITATIONS carrying, lifting, bending, sitting, standing, transfers, bed mobility, and locomotion level  PARTICIPATION LIMITATIONS: meal prep, cleaning, laundry, shopping, community activity, and occupation  [place], Past/current experiences, Time since onset of injury/illness/exacerbation, and 3+ comorbidities: chronic LBP, OSA, obesity, tobacco abuse, bil LE edema - diagnosed with CHF but cardiologist suspects COPD  are also affecting patient's functional outcome.   REHAB POTENTIAL: Good  CLINICAL DECISION MAKING: Evolving/moderate complexity  EVALUATION COMPLEXITY: Moderate   GOALS: Goals reviewed with patient? Yes  SHORT TERM GOALS: Target date: 04/20/2022   Patient will be independent with initial HEP.  Baseline: given Goal status: INITIAL  2.  Patient will report centralization of radicular symptoms.  Baseline: radicular pain down LLE to foot Goal status: INITIAL  3. Patient will participate in DGI to assess fall risk. Baseline: not assessed today Goal status: INITIAL  LONG TERM GOALS: Target date: 05/18/2022    Patient will be independent with advanced/ongoing HEP to improve outcomes and carryover.  Baseline: needs progression  Goal status: INITIAL  2.  Patient will report 75% improvement in low back pain to improve QOL.  Baseline: constant, severe Goal status: INITIAL  3.  Patient will demonstrate full pain free lumbar ROM to perform ADLs.   Baseline: increased pain flexion Goal status: INITIAL  4.  Patient will demonstrate improved functional strength as demonstrated by 5x STS < 20 seconds. Baseline: 44 seconds Goal status: INITIAL  5.  Patient will report <40% impairment on Oswestry  to demonstrate improved functional ability.  Baseline: 68% Goal status:  INITIAL   6.  Patient will tolerate 15 min of standing without increased LBP to perform household activities like dishes. Baseline: limited 10 minutes at most Goal status: INITIAL  7.  Patient will be able to walk for 30 minutes without reporting LE weakness.  Baseline: limited to 10 minutes, feels legs get weak.  Goal status: INITIAL  8. Patient will score >22/24 on DGI to decrease risk of falls.  Baseline: NT Goal status: INITIAL  9. Patient's gait speed will improve to >0.8 m/s for community ambulation.  Baseline: 0.7 m/s Goal status: INITIAL   PLAN: PT FREQUENCY: 2x/week  PT DURATION: 6 weeks  PLANNED INTERVENTIONS: Therapeutic exercises, Therapeutic activity, Neuromuscular re-education, Balance training, Gait training, Patient/Family education, Self Care, Joint mobilization, Stair training, Dry Needling, Electrical stimulation, Spinal mobilization, Cryotherapy, Moist heat, Traction, Ultrasound, Manual therapy, and Re-evaluation.  PLAN FOR NEXT SESSION: review and progress exercises - extension preference, manual therapy, TrDN to L glutes/piriformis, modalities PRN.      Jena Gauss, PT, DPT  04/06/2022, 4:46 PM

## 2022-04-14 ENCOUNTER — Ambulatory Visit: Payer: Commercial Managed Care - HMO

## 2022-04-20 ENCOUNTER — Ambulatory Visit: Payer: Commercial Managed Care - HMO

## 2022-04-20 DIAGNOSIS — M6281 Muscle weakness (generalized): Secondary | ICD-10-CM

## 2022-04-20 DIAGNOSIS — M5442 Lumbago with sciatica, left side: Secondary | ICD-10-CM | POA: Diagnosis not present

## 2022-04-20 DIAGNOSIS — R252 Cramp and spasm: Secondary | ICD-10-CM

## 2022-04-20 DIAGNOSIS — R262 Difficulty in walking, not elsewhere classified: Secondary | ICD-10-CM

## 2022-04-20 DIAGNOSIS — G8929 Other chronic pain: Secondary | ICD-10-CM

## 2022-04-20 NOTE — Therapy (Signed)
OUTPATIENT PHYSICAL THERAPY TREATMENT   Patient Name: Theresa Marquez MRN: [id number] DOB:07/05/1965, 56 y.o., female Today's Date: 04/20/2022   PT End of Session - 04/20/22 1448     Visit Number 2    Number of Visits 12    Date for PT Re-Evaluation 05/18/22    Authorization Type [employer] [id number]    Authorization Time Period authorization needed after 5th visit    PT Start Time 1403    PT Stop Time 1444    PT Time Calculation (min) 41 min    Activity Tolerance Patient tolerated treatment well    Behavior During Therapy WFL for tasks assessed/performed              Past Medical History:  Diagnosis Date   Allergy    Endometrial polyp 08/09/2017   Formatting of this note might be different from the original. S/p hysteroscopy with D&C 10/2017 with Dr OKeefe, was benign   Past Surgical History:  Procedure Laterality Date   DG GALL BLADDER Right    Patient Active Problem List   Diagnosis Date Noted   Chronic midline low back pain with left-sided sciatica 02/25/2022   OSA (obstructive sleep apnea) 12/14/2021   AKI (acute kidney injury) (HCC) 12/14/2021   Wheezing 12/14/2021   Acute exacerbation of CHF (congestive heart failure) (HCC) 12/03/2021   Congestive heart failure (HCC) 12/02/2021   Rhinitis 11/30/2021   Obesity, morbid, BMI 40.0-49.9 (HCC) 11/10/2021   Tobacco abuse 11/10/2021   Bilateral lower extremity edema 11/10/2021   Recurrent sinus infections 11/10/2021   Family history of thyroid disease 10/05/2021   Neck swelling 10/05/2021    PCP: Thompson, Aaron B. MD  REFERRING PROVIDER: Moore, Michael A, MD  REFERRING DIAG: M54.42,G89.29 (ICD-10-CM) - Chronic midline low back pain with left-sided sciatica   Rationale for Evaluation and Treatment Rehabilitation  THERAPY DIAG:  Chronic midline low back pain with left-sided sciatica  Muscle weakness (generalized)  Difficulty in walking, not elsewhere classified  Cramp and spasm  ONSET DATE: July  2023  SUBJECTIVE:                                                                                                                                                                                           SUBJECTIVE STATEMENT: Pt reports that she is struggling to stand washing dishes, she feels that she is battling multiple issues (LBP, also feels like a tight band is around her L calf). Sometimes the lower leg will swell and become discolored when lying down.  PERTINENT HISTORY:  OSA, obesity, tobacco abuse, bil LE edema - diagnosed with CHF but cardiologist suspects COPD    PAIN:  Are you having pain? Yes: NPRS scale: 5-6/10 Pain location:  midline low back radiating down LLE to foot Pain description: constant burning, intermittent sharp stabbing shooting pains down L leg, legs feel weak Aggravating factors: twisting, moving, standing up Relieving factors: changing position   PRECAUTIONS: Fall  WEIGHT BEARING RESTRICTIONS No  FALLS:  Has patient fallen in last 6 months? Yes. Number of falls 4 -- 2 knees buckling getting out of bed, 2 where feel like she looses all muscle control and slumps.   LIVING ENVIRONMENT: Lives with: lives with their daughter Lives in: House/apartment Stairs: Yes: Internal: 16 steps; on right going up, on left going up, and can reach both to basement Has following equipment at home: Quad cane small base and None  equipment in attic from parents.  Home set up to be handicap accessible.   OCCUPATION: unable to work currently  PLOF: Independent  PATIENT GOALS decrease back pain   OBJECTIVE:   DIAGNOSTIC FINDINGS:  XR of lumbar spine from 03/25/2022 and 02/25/2022 was independently reviewed and interpreted, showing disc height loss at L5/S1, no evidence of instability, no acute osseous abnormality  PATIENT SURVEYS:  Modified Oswestry 34/50 = 68% crippling disability    SCREENING FOR RED FLAGS: Bowel or bladder incontinence: Yes: some bladder weakness  - stress incontinence last 2 weaks Spinal tumors: none on Xrays, no MRI Cauda equina syndrome: No Compression fracture: No Abdominal aneurysm: No  COGNITION:  Overall cognitive status: Within functional limits for tasks assessed     SENSATION: Intact to light touch, reports slightly stronger in LLE, reports toes feel numb especially L foot, reports skin changes, circulation has been checked in LLE.    MUSCLE LENGTH: Hamstrings: Right ~75 deg; Left ~ 30 deg  POSTURE: rounded shoulders, forward head, and weight shift left  PALPATION: Non-pitting edema bil LE  LUMBAR ROM:   Active  A/PROM  eval  Flexion Limited 90% by pain  Extension WNL, decreased pain  Right lateral flexion To knee  Left lateral flexion To mid thigh  Right rotation WNL  Left rotation WNL   (Blank rows = not tested)  LOWER EXTREMITY ROM:   good hip ER bil, limited IR, L more painful and tighter than R.    LOWER EXTREMITY MMT:    MMT Right eval Left eval  Hip flexion 4+ 4+  Hip extension 4 3  Hip abduction 4+ 4+  Hip adduction 4+ 4+   Knee flexion 5 tremorous 5 tremorous  Knee extension 5 tremorous 5 tremorous  Ankle dorsiflexion 4 4  Ankle plantarflexion 4 4   (Blank rows = not tested)  LUMBAR SPECIAL TESTS:  Straight leg raise test: Positive and Slump test: Positive SLR on L increased pain 45 deg, R to 75 deg with hamstring tightness.   FUNCTIONAL TESTS:  5 times sit to stand: 44 seconds, with w/s to LLE to stand up. Increased pain.   GAIT: Distance walked: 50'  Assistive device utilized: None Level of assistance: Complete Independence Comments:  slow gait speed ~ 0.75 m/s    TODAY'S TREATMENT  04/20/22 Therapeutic Exercise: to improve strength and mobility.  Demo, verbal and tactile cues throughout for technique. Nustep L2x7min Prone press up x 10  Prone press up 2x15 sec hold Prone HS curls x 10 bil - cues to reduce ROM to avoid cramping Prone hip extension x 10 bil  DGI:  10/24 - high risk for falls  Therapeutic Exercise: to improve strength and mobility.    Demo, verbal and tactile cues throughout for technique. Prone x 2 min Prone knee bends x 10 bil Supine - LTR x 5 bil  PPT x 10 Demo - prone extension at wall and side bending.    PATIENT EDUCATION:  Education details: findings, POC, initial HEP Person educated: Patient Education method: Explanation, Demonstration, Verbal cues, and Handouts Education comprehension: verbalized understanding and returned demonstration   HOME EXERCISE PROGRAM: Access Code: [email or web address] URL: [email or web address] Date: 04/06/2022 Prepared by: Glenetta Hew  Exercises - Supine Lower Trunk Rotation  - 1 x daily - 7 x weekly - 1 sets - 10 reps - Supine Posterior Pelvic Tilt  - 1 x daily - 7 x weekly - 2 sets - 10 reps - 5 sec hold - Standing Lumbar Extension at Wall - Forearms  - 3 x daily - 7 x weekly - 1 sets - 10 reps - Lying Prone with 2 Pillows  - 1 x daily - 7 x weekly - 3 minutes hold  ASSESSMENT:  CLINICAL IMPRESSION: Theresa Marquez was reporting continued LBP today with sciatica. She reported feeling like a tight band was around her L calf and that it becomes discolored when lying down sometimes. The L lower leg looked fine today, with only a tight band along the lateral calf into the peroneals, advised if lower symptoms worsen to reach out to doctor for check up. She did well with the extension based exercises, the hip extensions were most challenging. She had cramping in R hamstrings with knee bends but after cuing to reduce ROM it subsided. Assessed DGI today, with her slow gait speed and very cautious movements, she scored 10/24 indicating high fall risk. She was able to participate in DGI so STG #3 is met, other STGs are in progress  OBJECTIVE IMPAIRMENTS Abnormal gait, decreased activity tolerance, decreased balance, decreased endurance, decreased mobility, difficulty walking, decreased ROM,  decreased strength, increased fascial restrictions, impaired perceived functional ability, increased muscle spasms, impaired flexibility, impaired sensation, postural dysfunction, and pain.   ACTIVITY LIMITATIONS carrying, lifting, bending, sitting, standing, transfers, bed mobility, and locomotion level  PARTICIPATION LIMITATIONS: meal prep, cleaning, laundry, shopping, community activity, and occupation  [place], Past/current experiences, Time since onset of injury/illness/exacerbation, and 3+ comorbidities: chronic LBP, OSA, obesity, tobacco abuse, bil LE edema - diagnosed with CHF but cardiologist suspects COPD  are also affecting patient's functional outcome.   REHAB POTENTIAL: Good  CLINICAL DECISION MAKING: Evolving/moderate complexity  EVALUATION COMPLEXITY: Moderate   GOALS: Goals reviewed with patient? Yes  SHORT TERM GOALS: Target date: 04/20/2022   Patient will be independent with initial HEP.  Baseline: given Goal status: IN PROGRESS  2.  Patient will report centralization of radicular symptoms.  Baseline: radicular pain down LLE to foot Goal status: IN PROGRESS  3. Patient will participate in DGI to assess fall risk. Baseline: not assessed today Goal status: MET - 04/20/22  LONG TERM GOALS: Target date: 05/18/2022    Patient will be independent with advanced/ongoing HEP to improve outcomes and carryover.  Baseline: needs progression  Goal status: IN PROGRESS  2.  Patient will report 75% improvement in low back pain to improve QOL.  Baseline: constant, severe Goal status: IN PROGRESS  3.  Patient will demonstrate full pain free lumbar ROM to perform ADLs.   Baseline: increased pain flexion Goal status: IN PROGRESS  4.  Patient will demonstrate improved functional strength as demonstrated by 5x STS < 20 seconds. Baseline: 44 seconds Goal status: IN PROGRESS  5.  Patient will report <40% impairment on Oswestry to demonstrate improved  functional ability.  Baseline: 68% Goal status: IN PROGRESS   6.  Patient will tolerate 15 min of standing without increased LBP to perform household activities like dishes. Baseline: limited 10 minutes at most Goal status: IN PROGRESS  7.  Patient will be able to walk for 30 minutes without reporting LE weakness.  Baseline: limited to 10 minutes, feels legs get weak.  Goal status: IN PROGRESS  8. Patient will score >22/24 on DGI to decrease risk of falls.  Baseline: NT Goal status: IN PROGRESS  9. Patient's gait speed will improve to >0.8 m/s for community ambulation.  Baseline: 0.7 m/s Goal status: IN PROGRESS   PLAN: PT FREQUENCY: 2x/week  PT DURATION: 6 weeks  PLANNED INTERVENTIONS: Therapeutic exercises, Therapeutic activity, Neuromuscular re-education, Balance training, Gait training, Patient/Family education, Self Care, Joint mobilization, Stair training, Dry Needling, Electrical stimulation, Spinal mobilization, Cryotherapy, Moist heat, Traction, Ultrasound, Manual therapy, and Re-evaluation.  PLAN FOR NEXT SESSION: maybe BERG to assess standing balance; review and progress exercises - extension preference, manual therapy, TrDN to L glutes/piriformis, modalities PRN.      Artist Pais, PTA 04/20/2022, 2:48 PM

## 2022-04-22 ENCOUNTER — Ambulatory Visit: Payer: Commercial Managed Care - HMO | Admitting: Physical Therapy

## 2022-04-22 ENCOUNTER — Encounter: Payer: Self-pay | Admitting: Physical Therapy

## 2022-04-22 DIAGNOSIS — G8929 Other chronic pain: Secondary | ICD-10-CM

## 2022-04-22 DIAGNOSIS — R252 Cramp and spasm: Secondary | ICD-10-CM

## 2022-04-22 DIAGNOSIS — M5442 Lumbago with sciatica, left side: Secondary | ICD-10-CM | POA: Diagnosis not present

## 2022-04-22 DIAGNOSIS — M6281 Muscle weakness (generalized): Secondary | ICD-10-CM

## 2022-04-22 DIAGNOSIS — R262 Difficulty in walking, not elsewhere classified: Secondary | ICD-10-CM

## 2022-04-22 NOTE — Therapy (Addendum)
PHYSICAL THERAPY DISCHARGE SUMMARY  Visits from Start of Care: 3  Current functional level related to goals / functional outcomes: No change   Remaining deficits: See below   Education / Equipment: HEP  Plan: Patient goals were not met. Patient is being discharged due to frequent no shows (3) and cancellations (3) per attendance policy.  Last seen on 04/22/22. A new order would be required to return to physical therapy.     Rennie Natter, PT, DPT 4:56 PM 06/08/2022     OUTPATIENT PHYSICAL THERAPY TREATMENT   Patient Name: Theresa Marquez MRN: [id number] DOB:1966/04/09, 56 y.o., female Today's Date: 04/22/2022   PT End of Session - 04/22/22 1404     Visit Number 3    Number of Visits 12    Date for PT Re-Evaluation 05/18/22    Authorization Type [employer] [id number]    Authorization Time Period authorization needed after 5th visit    PT Start Time 1402    PT Stop Time 7276    PT Time Calculation (min) 43 min    Activity Tolerance Patient tolerated treatment well    Behavior During Therapy [facility] for tasks assessed/performed              Past Medical History:  Diagnosis Date   Allergy    Endometrial polyp 08/09/2017   Formatting of this note might be different from the original. S/p hysteroscopy with D&C 10/2017 with Dr Doreene Nest, was benign   Past Surgical History:  Procedure Laterality Date   DG GALL BLADDER Right    Patient Active Problem List   Diagnosis Date Noted   Chronic midline low back pain with left-sided sciatica 02/25/2022   OSA (obstructive sleep apnea) 12/14/2021   AKI (acute kidney injury) ([place]) 12/14/2021   Wheezing 12/14/2021   Acute exacerbation of CHF (congestive heart failure) ([place]) 12/03/2021   Congestive heart failure ([place]) 12/02/2021   Rhinitis 11/30/2021   Obesity, morbid, BMI 40.0-49.9 ([place]) 11/10/2021   Tobacco abuse 11/10/2021   Bilateral lower extremity edema 11/10/2021   Recurrent sinus infections 11/10/2021   Family history of  thyroid disease 10/05/2021   Neck swelling 10/05/2021    PCP: Bonnita Hollow MD  REFERRING PROVIDER: Callie Fielding, MD  REFERRING DIAG: [phone] (ICD-10-CM) - Chronic midline low back pain with left-sided sciatica   Rationale for Evaluation and Treatment Rehabilitation  THERAPY DIAG:  Chronic midline low back pain with left-sided sciatica  Muscle weakness (generalized)  Difficulty in walking, not elsewhere classified  Cramp and spasm  ONSET DATE: July 2023  SUBJECTIVE:  SUBJECTIVE STATEMENT: Patient reports she had another "drop attack" this morning, falling and hitting L shoulder.  Reports continued severe back pain, in center of low back, does not think its muscular, and again complaining of feeling like blood flow to LLE is constricted, as leg turns red and mottled.   PERTINENT HISTORY:  OSA, obesity, tobacco abuse, bil LE edema - diagnosed with CHF but cardiologist suspects COPD  PAIN:  Are you having pain? Yes: NPRS scale: 6/10 Pain location:  midline low back radiating down LLE to foot Pain description: constant burning, intermittent sharp stabbing shooting pains down L leg, legs feel weak Aggravating factors: twisting, moving, standing up Relieving factors: changing position   PRECAUTIONS: Fall  WEIGHT BEARING RESTRICTIONS No  FALLS:  Has patient fallen in last 6 months? Yes. Number of falls 4 -- 2 knees buckling getting out of bed, 2 where feel like she looses all muscle control and slumps.   LIVING ENVIRONMENT: Lives with: lives with their daughter Lives in: House/apartment Stairs: Yes: Internal: 16 steps; on right going up, on left going up, and can reach both to basement Has following equipment at home: Quad cane small base and None  equipment in attic from  parents.  Home set up to be handicap accessible.   OCCUPATION: unable to work currently  PLOF: Independent  PATIENT GOALS decrease back pain   OBJECTIVE:   DIAGNOSTIC FINDINGS:  XR of lumbar spine from 03/25/2022 and 02/25/2022 was independently reviewed and interpreted, showing disc height loss at L5/S1, no evidence of instability, no acute osseous abnormality  PATIENT SURVEYS:  Modified Oswestry 34/50 = 68% crippling disability    SCREENING FOR RED FLAGS: Bowel or bladder incontinence: Yes: some bladder weakness - stress incontinence last 2 weaks Spinal tumors: none on Xrays, no MRI Cauda equina syndrome: No Compression fracture: No Abdominal aneurysm: No  COGNITION:  Overall cognitive status: Within functional limits for tasks assessed     SENSATION: Intact to light touch, reports slightly stronger in LLE, reports toes feel numb especially L foot, reports skin changes, circulation has been checked in LLE.    MUSCLE LENGTH: Hamstrings: Right ~75 deg; Left ~ 30 deg  POSTURE: rounded shoulders, forward head, and weight shift left  PALPATION: Non-pitting edema bil LE  LUMBAR ROM:   Active  A/PROM  eval  Flexion Limited 90% by pain  Extension WNL, decreased pain  Right lateral flexion To knee  Left lateral flexion To mid thigh  Right rotation WNL  Left rotation WNL   (Blank rows = not tested)  LOWER EXTREMITY ROM:   good hip ER bil, limited IR, L more painful and tighter than R.    LOWER EXTREMITY MMT:    MMT Right eval Left eval  Hip flexion 4+ 4+  Hip extension 4 3  Hip abduction 4+ 4+  Hip adduction 4+ 4+   Knee flexion 5 tremorous 5 tremorous  Knee extension 5 tremorous 5 tremorous  Ankle dorsiflexion 4 4  Ankle plantarflexion 4 4   (Blank rows = not tested)  LUMBAR SPECIAL TESTS:  Straight leg raise test: Positive and Slump test: Positive SLR on L increased pain 45 deg, R to 75 deg with hamstring tightness.   FUNCTIONAL TESTS:  5 times sit  to stand: 44 seconds, with w/s to LLE to stand up. Increased pain.   GAIT: Distance walked: 54'  Assistive device utilized: None Level of assistance: Complete Independence Comments:  slow gait speed ~ 0.75 m/s  TODAY'S TREATMENT  04/20/22 Therapeutic Exercise: to improve strength and mobility.  Demo, verbal and tactile cues throughout for technique. Nustep L4 x 8 min  Side glides - preferred L x 10  Standing lumbar extension x 10 In prone - prone knee bends x 15 bil  -prone hip extension x 10 R, x 5 left Supine PPT x 10  Supine bridges x 10  Manual Therapy: to decrease muscle spasm and pain and improve mobility UPA mobs lumbar spine, TPR L piriformis.    Therapeutic Exercise: to improve strength and mobility.  Demo, verbal and tactile cues throughout for technique. Nustep L2x32mn Prone press up x 10  Prone press up 2x15 sec hold Prone HS curls x 10 bil - cues to reduce ROM to avoid cramping Prone hip extension x 10 bil  DGI: 10/24 - high risk for falls  Therapeutic Exercise: to improve strength and mobility.  Demo, verbal and tactile cues throughout for technique. Prone x 2 min Prone knee bends x 10 bil Supine - LTR x 5 bil  PPT x 10 Demo - prone extension at wall and side bending.    PATIENT EDUCATION:  Education details: findings, POC, initial HEP Person educated: Patient Education method: Explanation, Demonstration, Verbal cues, and Handouts Education comprehension: verbalized understanding and returned demonstration   HOME EXERCISE PROGRAM: Access Code: [email or web address]: [email or web address] Date: 04/22/2022 Prepared by: EGlenetta Hew Exercises - Supine Lower Trunk Rotation  - 1 x daily - 7 x weekly - 1 sets - 10 reps - Supine Posterior Pelvic Tilt  - 1 x daily - 7 x weekly - 2 sets - 10 reps - 5 sec hold - Standing Lumbar Extension at [place] - 3 x daily - 7 x weekly - 1 sets - 10 reps - Lying Prone with 2 Pillows  - 1 x daily  - 7 x weekly - 3 minutes hold - Prone Hip Extension  - 1 x daily - 7 x weekly - 3 sets - 10 reps - Prone Press Up On Elbows  - 1 x daily - 7 x weekly - 3 sets - 10 reps - Supine Bridge  - 1 x daily - 7 x weekly - 2 sets - 10 reps - Lateral Shift Correction at Wall  - 3 x daily - 7 x weekly - 1 sets - 10 reps  ASSESSMENT:  CLINICAL IMPRESSION: Theresa Hainesentered today with very slow guarded gait. She reports another fall this morning hitting R shoulder.  She has still not been scheduled for MRI, so contacted her physician's office, and asked her to call as well.  She also reports having vision changes, feeling like she is losing peripheral vision over last week, advised to contact PCP.  She reports she has been doing exercises, but does not feel they are effective and does not feel that PT will be helpful as it does not feel like "its a muscle problem."  Explained purpose of exercises where to open up and hopefully relieve pressure on spine to decrease irritability of lumbar paraspinals.  She was able to perform exercises today but reported increased pain and maximal effort needed to do L hip extensions.  No change in pain reported with manual therapy, but declined modalities today.  BEURA MCCAUSLINcontinues to demonstrate potential for improvement and would benefit from continued skilled therapy to address impairments.    OBJECTIVE IMPAIRMENTS Abnormal gait, decreased activity tolerance, decreased balance, decreased endurance, decreased mobility, difficulty walking,  decreased ROM, decreased strength, increased fascial restrictions, impaired perceived functional ability, increased muscle spasms, impaired flexibility, impaired sensation, postural dysfunction, and pain.   ACTIVITY LIMITATIONS carrying, lifting, bending, sitting, standing, transfers, bed mobility, and locomotion level  PARTICIPATION LIMITATIONS: meal prep, cleaning, laundry, shopping, community activity, and occupation  [place], Past/current experiences, Time since onset of injury/illness/exacerbation, and 3+ comorbidities: chronic LBP, OSA, obesity, tobacco abuse, bil LE edema - diagnosed with CHF but cardiologist suspects COPD  are also affecting patient's functional outcome.   REHAB POTENTIAL: Good  CLINICAL DECISION MAKING: Evolving/moderate complexity  EVALUATION COMPLEXITY: Moderate   GOALS: Goals reviewed with patient? Yes  SHORT TERM GOALS: Target date: 04/20/2022   Patient will be independent with initial HEP.  Baseline: given Goal status: IN PROGRESS  2.  Patient will report centralization of radicular symptoms.  Baseline: radicular pain down LLE to foot Goal status: IN PROGRESS  3. Patient will participate in DGI to assess fall risk. Baseline: not assessed today Goal status: MET - 04/20/22  LONG TERM GOALS: Target date: 05/18/2022    Patient will be independent with advanced/ongoing HEP to improve outcomes and carryover.  Baseline: needs progression  Goal status: IN PROGRESS  2.  Patient will report 75% improvement in low back pain to improve QOL.  Baseline: constant, severe Goal status: IN PROGRESS  3.  Patient will demonstrate full pain free lumbar ROM to perform ADLs.   Baseline: increased pain flexion Goal status: IN PROGRESS  4.  Patient will demonstrate improved functional strength as demonstrated by 5x STS < 20 seconds. Baseline: 44 seconds Goal status: IN PROGRESS  5.  Patient will report <40% impairment on Oswestry to demonstrate improved functional ability.  Baseline: 68% Goal status: IN PROGRESS   6.  Patient will tolerate 15 min of standing without increased LBP to perform household activities like dishes. Baseline: limited 10 minutes at most Goal status: IN PROGRESS  7.  Patient will be able to walk for 30 minutes without reporting LE weakness.  Baseline: limited to 10 minutes, feels legs get weak.  Goal status: IN PROGRESS  8. Patient will  score >22/24 on DGI to decrease risk of falls.  Baseline: NT Goal status: IN PROGRESS  9. Patient's gait speed will improve to >0.8 m/s for community ambulation.  Baseline: 0.7 m/s Goal status: IN PROGRESS   PLAN: PT FREQUENCY: 2x/week  PT DURATION: 6 weeks  PLANNED INTERVENTIONS: Therapeutic exercises, Therapeutic activity, Neuromuscular re-education, Balance training, Gait training, Patient/Family education, Self Care, Joint mobilization, Stair training, Dry Needling, Electrical stimulation, Spinal mobilization, Cryotherapy, Moist heat, Traction, Ultrasound, Manual therapy, and Re-evaluation.  PLAN FOR NEXT SESSION:  review and progress exercises - extension preference, manual therapy, TrDN to L glutes/piriformis, modalities PRN.      Rennie Natter, PT, DPT  04/22/2022, 4:22 PM

## 2022-04-27 ENCOUNTER — Ambulatory Visit: Payer: Commercial Managed Care - HMO | Admitting: Physical Therapy

## 2022-04-29 ENCOUNTER — Ambulatory Visit: Payer: Commercial Managed Care - HMO | Attending: Orthopedic Surgery

## 2022-05-04 ENCOUNTER — Ambulatory Visit: Payer: Commercial Managed Care - HMO | Admitting: Physical Therapy

## 2022-05-06 ENCOUNTER — Ambulatory Visit: Payer: Commercial Managed Care - HMO

## 2022-05-09 NOTE — Progress Notes (Unsigned)
Cardiology Office Note:    Date:  05/10/2022   ID:  Theresa Marquez, DOB 10/25/1965, MRN [id number]  PCP:  Garnette Gunner, MD  Cardiologist:  Norman Herrlich, MD    Referring MD: Garnette Gunner, MD    ASSESSMENT:    1. Bilateral lower extremity edema   2. OSA (obstructive sleep apnea)   3. Pulsatile tinnitus   4. Vision loss    PLAN:    In order of problems listed above:  Improved I told her she could take her furosemide 3 days a week continue spironolactone today check BMP proBNP her standing blood pressure is 130/80 no orthostatic shift. I suspect her edema is noncardiac and sleep apnea may play a role With her neurologic symptoms and pulsatile tinnitus and visual loss we will check a cerebrovascular duplex I encouraged her to get the MRI performed   Next appointment: 6 months   Medication Adjustments/Labs and Tests Ordered: Current medicines are reviewed at length with the patient today.  Concerns regarding medicines are outlined above.  No orders of the defined types were placed in this encounter.  No orders of the defined types were placed in this encounter.   Chief Complaint  Patient presents with   Follow-up   Congestive Heart Failure    History of Present Illness:    Theresa Marquez is a 56 y.o. female with a hx of lower extremity edema and shortness of breath in the context of high-dose steroid therapy obstructive sleep apnea heavy history of cigarette smoking and probable COPD last seen 02/02/2022.  She was admitted to [facility] 12/01/2021 to 12/07/2021 which shortness of breath and edema and diagnosed as heart failure.  She diuresed in hospital weight down 10 pounds over 3 days and was transitioned to oral furosemide and continue Aldactone and SGLT2 inhibitor.  Other problems included suspected sleep apnea chest tightness with a normal high-sensitivity troponin obesity with a BMI of 48.  She also has a 40-pack-year cigarette smoking history.    Echocardiogram 12/02/2021 was technically difficult left ventricular systolic function is normal EF 55 to 60% with grade 1 diastolic dysfunction.  The right ventricle is normal size and function and pulmonary pressure could not be assessed there is no valvular abnormality noted the inferior vena cava was dilated consistent with elevated right atrial and venous pressure. BNP level was quite low at 33 on admission to the hospital.   Chest x-ray showed cardiomegaly and findings of pulmonary vascular congestion.  Read as possible on her second x-ray.  Compliance with diet, lifestyle and medications: Yes   She alerts me labs were drawn at [facility] that we have never received the results from She been taking her diuretic 2 days a week gets worked but she still has fullness in her legs I told her she could take it 3 times weekly Not having shortness of breath orthopnea chest pain or syncope. She has symptoms of neuropathic pain in the arms and legs she is pending an MRI she also has pulsatile tinnitus at times she has had transient visual loss and tells me she has drop attacks she does not have any particular weakness or syncope she just has profound loss of tone.  She is concerned about spinal stenosis Past Medical History:  Diagnosis Date   Allergy    Endometrial polyp 08/09/2017   Formatting of this note might be different from the original. S/p hysteroscopy with [facility] 10/2017 with Dr Elmore Guise, was benign  Past Surgical History:  Procedure Laterality Date   DG GALL BLADDER Right     Current Medications: Current Meds  Medication Sig   acetaminophen (TYLENOL) 325 MG tablet Take 2 tablets (650 mg total) by mouth every 6 (six) hours as needed for mild pain, fever or headache.   albuterol (VENTOLIN HFA) 108 (90 Base) MCG/ACT inhaler Inhale 2 puffs into the lungs every 6 (six) hours as needed for wheezing or shortness of breath.   cetirizine (ZYRTEC) 10 MG tablet Take 10 mg by mouth daily.    furosemide (LASIX) 40 MG tablet Take 1 tablet (40 mg total) by mouth daily as needed. (Patient taking differently: Take 40 mg by mouth daily as needed for fluid or edema.)   ipratropium (ATROVENT) 0.06 % nasal spray Place 2 sprays into both nostrils 2 (two) times daily.   spironolactone (ALDACTONE) 25 MG tablet TAKE 1 TABLET BY MOUTH EVERY DAY     Allergies:   Codeine and Prednisone   Social History   Socioeconomic History   Marital status: Divorced    Spouse name: Not on file   Number of children: 3   Years of education: Not on file   Highest education level: Associate degree: occupational, Scientist, product/process development, or vocational program  Occupational History   Occupation: [employer]    Comment: Full-Time.  Tobacco Use   Smoking status: Every Day    Packs/day: 1.00    Years: 30.00    Total pack years: 30.00    Types: Cigarettes    Start date: 10/26/1969   Smokeless tobacco: Never   Tobacco comments:    Smoking cessation  Vaping Use   Vaping Use: Never used  Substance and Sexual Activity   Alcohol use: Not Currently    Comment: socially   Drug use: Never   Sexual activity: Not Currently  Other Topics Concern   Not on file  Social History Narrative   Not on file   Social Determinants of Health   Financial Resource Strain: Low Risk  (12/04/2021)   Overall Financial Resource Strain (CARDIA)    Difficulty of Paying Living Expenses: Not very hard  Food Insecurity: No Food Insecurity (12/04/2021)   Hunger Vital Sign    Worried About Running Out of Food in the Last Year: Never true    Ran Out of Food in the Last Year: Never true  Transportation Needs: No Transportation Needs (12/04/2021)   PRAPARE - Administrator, Civil Service (Medical): No    Lack of Transportation (Non-Medical): No  Physical Activity: Not on file  Stress: Not on file  Social Connections: Not on file     Family History: The patient's family history includes Cancer in her father; Cerebral palsy in her daughter;  Kidney disease in her mother. ROS:   Please see the history of present illness.    All other systems reviewed and are negative.  EKGs/Labs/Other Studies Reviewed:    The following studies were reviewed today:   Recent Labs: 12/01/2021: B Natriuretic Peptide 33.3 12/02/2021: TSH 1.914 12/03/2021: Hemoglobin 14.5; Platelets 310 12/06/2021: Magnesium 2.3 12/14/2021: ALT 23; BUN 15; Creatinine, Ser 0.94; Potassium 4.4; Sodium 135  Recent Lipid Panel    Component Value Date/Time   CHOL 179 12/02/2021 0553   TRIG 63 12/02/2021 0553   HDL 40 (L) 12/02/2021 0553   CHOLHDL 4.5 12/02/2021 0553   VLDL 13 12/02/2021 0553   LDLCALC 126 (H) 12/02/2021 0553    Physical Exam:    VS:  BP  118/78 (BP Location: Left Arm, Patient Position: Sitting, Cuff Size: Large)   Pulse 88   Ht 5\' 4"  (1.626 m)   Wt 268 lb (121.6 kg)   SpO2 94%   BMI 46.00 kg/m     Wt Readings from Last 3 Encounters:  05/10/22 268 lb (121.6 kg)  03/25/22 272 lb (123.4 kg)  03/16/22 271 lb 9.6 oz (123.2 kg)     GEN: Obese well nourished, well developed in no acute distress HEENT: Normal NECK: No JVD; No carotid bruits LYMPHATICS: No lymphadenopathy CARDIAC: RRR, no murmurs, rubs, gallops RESPIRATORY:  Clear to auscultation without rales, wheezing or rhonchi  ABDOMEN: Soft, non-tender, non-distended MUSCULOSKELETAL:  No edema; No deformity  SKIN: Warm and dry NEUROLOGIC:  Alert and oriented x 3 PSYCHIATRIC:  Normal affect    Signed, 03/18/22, MD  05/10/2022 2:43 PM    [facility]

## 2022-05-10 ENCOUNTER — Ambulatory Visit: Payer: Commercial Managed Care - HMO | Attending: Cardiology | Admitting: Cardiology

## 2022-05-10 ENCOUNTER — Encounter: Payer: Self-pay | Admitting: Cardiology

## 2022-05-10 VITALS — BP 130/80 | HR 88 | Ht 64.0 in | Wt 268.0 lb

## 2022-05-10 DIAGNOSIS — G4733 Obstructive sleep apnea (adult) (pediatric): Secondary | ICD-10-CM | POA: Diagnosis not present

## 2022-05-10 DIAGNOSIS — H547 Unspecified visual loss: Secondary | ICD-10-CM | POA: Diagnosis not present

## 2022-05-10 DIAGNOSIS — H93A9 Pulsatile tinnitus, unspecified ear: Secondary | ICD-10-CM | POA: Diagnosis not present

## 2022-05-10 DIAGNOSIS — R600 Localized edema: Secondary | ICD-10-CM

## 2022-05-10 MED ORDER — FUROSEMIDE 40 MG PO TABS: ORAL_TABLET | ORAL | 3 refills | Status: DC

## 2022-05-10 NOTE — Patient Instructions (Addendum)
Medication Instructions:  Your physician has recommended you make the following change in your medication:   Take Furosemide 40 mg on Monday, Wednesday and Friday.  *If you need a refill on your cardiac medications before your next appointment, please call your pharmacy*   Lab Work: None ordered If you have labs (blood work) drawn today and your tests are completely normal, you will receive your results only by: MyChart Message (if you have MyChart) OR A paper copy in the mail If you have any lab test that is abnormal or we need to change your treatment, we will call you to review the results.   Testing/Procedures: Your physician has requested that you have a carotid duplex. This test is an ultrasound of the carotid arteries in your neck. It looks at blood flow through these arteries that supply the brain with blood. Allow one hour for this exam. There are no restrictions or special instructions.    Follow-Up: At [facility], you and your health needs are our priority.  As part of our continuing mission to provide you with exceptional heart care, we have created designated Provider Care Teams.  These Care Teams include your primary Cardiologist (physician) and Advanced Practice Providers (APPs -  Physician Assistants and Nurse Practitioners) who all work together to provide you with the care you need, when you need it.  We recommend signing up for the patient portal called "MyChart".  Sign up information is provided on this After Visit Summary.  MyChart is used to connect with patients for Virtual Visits (Telemedicine).  Patients are able to view lab/test results, encounter notes, upcoming appointments, etc.  Non-urgent messages can be sent to your provider as well.   To learn more about what you can do with MyChart, go to [email or web address].    Your next appointment:   6 month(s)  The format for your next appointment:   In Person  Provider:   Norman Herrlich, MD   Other  Instructions NA

## 2022-05-10 NOTE — Addendum Note (Signed)
Addended by: Eleonore Chiquito on: 05/10/2022 03:01 PM   Modules accepted: Orders

## 2022-05-11 ENCOUNTER — Other Ambulatory Visit: Payer: Self-pay

## 2022-05-11 ENCOUNTER — Ambulatory Visit: Payer: Commercial Managed Care - HMO

## 2022-05-11 ENCOUNTER — Telehealth (HOSPITAL_BASED_OUTPATIENT_CLINIC_OR_DEPARTMENT_OTHER): Payer: Self-pay

## 2022-05-11 LAB — BASIC METABOLIC PANEL
BUN/Creatinine Ratio: 8 — ABNORMAL LOW (ref 9–23)
BUN: 6 mg/dL (ref 6–24)
CO2: 24 mmol/L (ref 20–29)
Calcium: 9.5 mg/dL (ref 8.7–10.2)
Chloride: 99 mmol/L (ref 96–106)
Creatinine, Ser: 0.76 mg/dL (ref 0.57–1.00)
Glucose: 86 mg/dL (ref 70–99)
Potassium: 4.2 mmol/L (ref 3.5–5.2)
Sodium: 143 mmol/L (ref 134–144)
eGFR: 92 mL/min/{1.73_m2} (ref >59)

## 2022-05-11 LAB — PRO B NATRIURETIC PEPTIDE: NT-Pro BNP: 145 pg/mL (ref 0–287)

## 2022-05-18 ENCOUNTER — Ambulatory Visit: Payer: Commercial Managed Care - HMO

## 2022-05-26 ENCOUNTER — Ambulatory Visit (INDEPENDENT_AMBULATORY_CARE_PROVIDER_SITE_OTHER): Payer: Commercial Managed Care - HMO | Admitting: Orthopedic Surgery

## 2022-05-26 DIAGNOSIS — M4712 Other spondylosis with myelopathy, cervical region: Secondary | ICD-10-CM

## 2022-05-26 DIAGNOSIS — G5622 Lesion of ulnar nerve, left upper limb: Secondary | ICD-10-CM | POA: Diagnosis not present

## 2022-05-26 NOTE — Progress Notes (Signed)
Orthopedic Spine Surgery Office Note  Assessment: Patient is a 56 y.o. female with 3 issues:  1.  She has numbness and tingling radiating down her arms in an ulnar nerve distribution this could be cubital tunnel syndrome.  She also has physical exam findings consistent with that and carpal tunnel syndrome. 2.  She has gait unsteadiness and clumsiness with her hands along with physical exam findings consistent with myelopathy. 3.  She has pain over the left wrist.  She is tender to palpation over the first extensor compartment along with a positive Lourena Simmonds which is consistent with de Quervain's syndrome   Plan: -Recommended MRI of the cervical spine to evaluate for myelopathy, it will also evaluate for radiculopathy -Given her positive provocative test for peripheral nerve entrapment, recommend EMG/NCS.  A referral was provided to her today In regards to her left wrist, she has pain over the first extensor compartment along with a positive Lourena Simmonds test so I recommended a thumb spica splint that was provided to her today in the office.  I also encouraged her to use anti-inflammatory medications and rub diclofenac gel over the area.  I demonstrated her how to gradually desensitize the area using the diclofenac gel -Patient should return to office in 4 weeks, repeat x-rays of cervical spine at next visit: None   Patient expressed understanding of the plan and all questions were answered to the patient's satisfaction.   __________________________________________________________________________  History: Patient is a 56 y.o. female who has been previously seen in the office for symptoms consistent with cervical radiculopathy. Since the last visit, symptom intensity has become more severe.  She is complaining of neck pain that radiates into her bilateral shoulders.  She states that she is also now having paresthesias, numbness, and pain that radiates from her arm down the hand.  The most  affected digits are the ring and small finger.  Her left side is worse but she is also experiencing the symptoms on the right side.  She is still having significant unsteadiness with gait and has trouble going up the stairs as a result.  She has had issues with fine motor skills in her hands that we talked about last time.  There has been no changes in those symptoms.  She also has significant low back pain.  This is chronic in nature but has gotten worse recently.  It radiates down the left leg along the posterior aspect.  The severity of the pain has increased but there have been no other changes to her symptoms.  Recently, she has developed pain over the left wrist.  This is a new pain that she did not have when I last saw her.  Pain is felt over the dorsal and radial aspect of the wrist.  She feels it is worse with activity and improves with rest.  There is no trauma or injury to the wrist.  She has noticed swelling in the area of pain.  No other swelling has been noted.  She has not had pain like this before.  She does not recall doing any repetitive activities that may have preceded this pain.  She has numbness and tingling in the hand as described above.  She has no symptoms on the contralateral wrist.  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, lifting, and pushing carts Duration of symptoms: since July 2023 Radicular symptoms: yes, goes from her back into the back of her hip and all the way down the back of her leg Weakness: denies Difficulty with fine motor skills (e.g., buttoning shirts, handwriting): yes, has noted that she is occasionally  dropping objects unintentionally and has had trouble with handwriting Symptoms of imbalance: yes, feels unsteady and like her legs are giving out under her Paresthesias and numbness: yes, in the same distribution as the pain in her left leg Bowel or bladder incontinence: denies Saddle anesthesia: denies Has had LBP in the past and chiropractor helped, but this time it is not getting better like before END OF COPY  Physical Exam:  General: no acute distress, appears stated age Neurologic: alert, answering questions appropriately, following commands Respiratory: unlabored breathing on room air, symmetric chest rise Psychiatric: appropriate affect, normal cadence to speech   LUE: positive finkelstein, tender to palpation over the first extensor compartment, no other tenderness palpation over the remainder of the wrist and hand, AIN/PIN/IO intact, sensation intact to light touch in the median/ulnar/radial/axillary nerve distributions, hand warm and well-perfused, palpable radial pulse, flexes and extends fingers  MSK (spine):  -Strength exam      Left  Right Grip strength                4/5  5/5 Interosseus   4/5   5/5 Wrist extension  4/5  5/5 Wrist flexion   4/5  5/5 Elbow flexion   5/5  5/5 Deltoid    5/5  5/5  Left upper extremity strength exam limited by pain at wrist  EHL    5/5  4/5 TA    5/5  5/5 GSC    5/5  5/5 Knee extension  5/5  5/5 Hip flexion   5/5  5/5  -Sensory exam    Sensation intact to light touch in L3-S1 nerve distributions of bilateral lower extremities  Sensation intact to light touch in C5-T1 nerve distributions of bilateral upper extremities  -Brachioradialis DTR: 2/4 on the left, 2/4 on the right -Biceps DTR: 2/4 on the left, 2/4 on the right -Achilles DTR: 3/4 on the left, 2/4 on the right -Patellar tendon DTR: 1/4 on the left, 1/4 on the right  -Spurling: negative bilaterally -Hoffman sign: positive on the right, negative on the left -Clonus:  no beats bilaterally -Interosseous wasting: none seen -Grip and release test: positive -Romberg: positive -Gait: slow and wide based -Imbalance with tandem gait: yes  Left shoulder exam: no pain through range of motion Right shoulder exam: no pain through range of motion  Tinel's at wrist: positive bilaterally Phalen's at wrist: negative bilaterally Durkan's: positive on the left, negative on the right  Tinel's at elbow: positive on the left, negative on the right  Imaging: XR of the cervical spine from 03/25/2022 was previously independently reviewed and interpreted, showing disc height loss and osteophyte formation at C5/6 and C6/7, no evidence of instability, no acute osseous abnormality   Patient name: Theresa Marquez Patient MRN: [id number] Date of visit: 05/26/22

## 2022-05-27 ENCOUNTER — Encounter: Payer: Self-pay | Admitting: Neurology

## 2022-05-27 ENCOUNTER — Other Ambulatory Visit: Payer: Self-pay

## 2022-05-27 DIAGNOSIS — R202 Paresthesia of skin: Secondary | ICD-10-CM

## 2022-05-28 NOTE — Addendum Note (Signed)
Addended by: Willia Craze on: 05/28/2022 07:51 AM   Modules accepted: Orders

## 2022-06-23 ENCOUNTER — Ambulatory Visit: Payer: Commercial Managed Care - HMO | Admitting: Orthopedic Surgery

## 2022-06-26 ENCOUNTER — Ambulatory Visit (HOSPITAL_BASED_OUTPATIENT_CLINIC_OR_DEPARTMENT_OTHER): Payer: Commercial Managed Care - HMO

## 2022-06-29 ENCOUNTER — Ambulatory Visit (INDEPENDENT_AMBULATORY_CARE_PROVIDER_SITE_OTHER): Payer: Medicaid Other | Admitting: Neurology

## 2022-06-29 DIAGNOSIS — R202 Paresthesia of skin: Secondary | ICD-10-CM

## 2022-06-29 DIAGNOSIS — G5602 Carpal tunnel syndrome, left upper limb: Secondary | ICD-10-CM

## 2022-06-29 NOTE — Procedures (Signed)
[facility]  [address], [place]  [place], [place] [address] Tel: [phone] Fax: [phone] Test Date:  06/29/2022  Patient: Theresa Marquez DOB: 01-03-1966 Physician: Narda Amber, DO  Sex: Female Height: 5\' 4"  Ref Phys: Ileene Rubens, MD  ID#: [id number]   Technician:    History: This is a 57 year old female referred for evaluation of bilateral hand paresthesias, worse on the left  NCV & EMG Findings: Extensive electrodiagnostic testing of the left upper extremity and additional studies of the right shows:  Left mixed palmar sensory responses show prolonged latency.  Bilateral median, ulnar, and right mixed palmar sensory responses are within normal limits. Left median motor response shows evidence of a Martin-Gruber anastomosis, a normal anatomic variant.  Right median and bilateral ulnar motor responses are within normal limits. There is a global pattern of incomplete motor unit activation as seen by variable motor unit recruitment.  These findings may be due to poor effort, pain, or central disorder of motor unit control.  Needle electrode examination was prematurely terminated due to pain.   Impression: Left median neuropathy at or distal to the wrist (very mild), consistent with a clinical diagnosis of carpal tunnel syndrome.   There is no evidence of an ulnar neuropathy affecting either upper extremity. Needle electrode examination was limited by incomplete motor unit activation due to pain and therefore unable to assess for cervical radiculopathy.   ___________________________ Narda Amber, DO    Nerve Conduction Studies   Stim Site NR Peak (ms) Norm Peak (ms) O-P Amp (V) Norm O-P Amp  Left Median Anti Sensory (2nd Digit)  32 C  Wrist    3.3 <3.6 27.4 >15  Right Median Anti Sensory (2nd Digit)  32 C  Wrist    3.4 <3.6 29.0 >15  Left Ulnar Anti Sensory (5th Digit)  32 C  Wrist    2.7 <3.1 27.2 >10  Right Ulnar Anti Sensory (5th Digit)  32 C   Wrist    2.9 <3.1 34.6 >10     Stim Site NR Onset (ms) Norm Onset (ms) O-P Amp (mV) Norm O-P Amp Site1 Site2 Delta-0 (ms) Dist (cm) Vel (m/s) Norm Vel (m/s)  Left Median Motor (Abd Poll Brev)  32 C  Wrist    3.4 <4.0 *5.1 >6 Elbow Wrist 4.7 28.0 60 >50  Elbow    8.1  5.1  Ulnar-wrist crossover Elbow 4.7 0.0    Ulnar-wrist crossover    3.4  2.7         Right Median Motor (Abd Poll Brev)  32 C  Wrist    3.4 <4.0 9.1 >6 Elbow Wrist 5.0 28.0 56 >50  Elbow    8.4  8.5         Left Ulnar Motor (Abd Dig Minimi)  32 C  Wrist    2.3 <3.1 9.5 >7 B Elbow Wrist 4.0 21.0 53 >50  B Elbow    6.3  9.4  A Elbow B Elbow 1.4 9.0 64 >50  A Elbow    7.7  9.4         Right Ulnar Motor (Abd Dig Minimi)  32 C  Wrist    3.1 <3.1 11.9 >7 B Elbow Wrist 4.2 22.0 52 >50  B Elbow    7.3  11.4  A Elbow B Elbow 1.5 10.0 65 >50  A Elbow    8.8  11.1            Stim Site NR Peak (  ms) Norm Peak (ms) P-T Amp (V) Site1 Site2 Delta-P (ms) Norm Delta (ms)  Left Median/Ulnar Palm Comparison (Wrist - 8cm)  32 C  Median Palm    1.9 <2.2 35.1 Median Palm Ulnar Palm *0.6   Ulnar Palm    1.3 <2.2 7.3      Right Median/Ulnar Palm Comparison (Wrist - 8cm)  32 C  Median Palm    1.9 <2.2 36.6 Median Palm Ulnar Palm 0.1   Ulnar Palm    2.0 <2.2 24.5       Electromyography   Side Muscle Ins.Act Fibs Fasc Recrt Amp Dur Poly Activation Comment  Left 1stDorInt Nml Nml Nml Nml Nml Nml Nml *Variable N/A  Left PronatorTeres Nml Nml Nml Nml Nml Nml Nml *Variable N/A  Left Biceps Nml Nml Nml Nml Nml Nml Nml *Variable N/A  Left Triceps Nml Nml Nml Nml Nml Nml Nml *Variable N/A  Left Deltoid Nml Nml Nml Nml Nml Nml Nml *Variable N/A  Right 1stDorInt Nml Nml Nml Nml Nml Nml Nml *Variable N/A  Right PronatorTeres Nml Nml Nml Nml Nml Nml Nml *Variable N/A      Waveforms:

## 2022-06-30 ENCOUNTER — Ambulatory Visit: Payer: Commercial Managed Care - HMO | Admitting: Orthopedic Surgery

## 2022-07-03 ENCOUNTER — Ambulatory Visit (HOSPITAL_BASED_OUTPATIENT_CLINIC_OR_DEPARTMENT_OTHER): Payer: Commercial Managed Care - HMO

## 2022-07-15 ENCOUNTER — Ambulatory Visit: Payer: Commercial Managed Care - HMO | Admitting: Orthopedic Surgery

## 2022-08-04 ENCOUNTER — Ambulatory Visit (HOSPITAL_BASED_OUTPATIENT_CLINIC_OR_DEPARTMENT_OTHER)
Admission: RE | Admit: 2022-08-04 | Discharge: 2022-08-04 | Disposition: A | Discharge status: Home / Self Care | Payer: Medicaid Other | Source: Ambulatory Visit | Attending: Orthopedic Surgery | Admitting: Orthopedic Surgery

## 2022-08-04 DIAGNOSIS — M47812 Spondylosis without myelopathy or radiculopathy, cervical region: Secondary | ICD-10-CM | POA: Diagnosis not present

## 2022-08-04 DIAGNOSIS — M4712 Other spondylosis with myelopathy, cervical region: Secondary | ICD-10-CM

## 2022-08-04 DIAGNOSIS — R200 Anesthesia of skin: Secondary | ICD-10-CM | POA: Diagnosis not present

## 2022-08-17 ENCOUNTER — Ambulatory Visit: Payer: Medicaid Other | Admitting: Orthopedic Surgery

## 2022-08-24 ENCOUNTER — Encounter: Payer: Self-pay | Admitting: Family Medicine

## 2022-08-24 ENCOUNTER — Telehealth: Payer: Self-pay | Admitting: Orthopedic Surgery

## 2022-08-24 ENCOUNTER — Ambulatory Visit (INDEPENDENT_AMBULATORY_CARE_PROVIDER_SITE_OTHER): Payer: Medicaid Other | Admitting: Family Medicine

## 2022-08-24 VITALS — BP 132/84 | HR 77 | Temp 97.6°F | Wt 272.6 lb

## 2022-08-24 DIAGNOSIS — L308 Other specified dermatitis: Secondary | ICD-10-CM

## 2022-08-24 DIAGNOSIS — M542 Cervicalgia: Secondary | ICD-10-CM | POA: Insufficient documentation

## 2022-08-24 DIAGNOSIS — G4733 Obstructive sleep apnea (adult) (pediatric): Secondary | ICD-10-CM | POA: Diagnosis not present

## 2022-08-24 DIAGNOSIS — R202 Paresthesia of skin: Secondary | ICD-10-CM | POA: Diagnosis not present

## 2022-08-24 DIAGNOSIS — E538 Deficiency of other specified B group vitamins: Secondary | ICD-10-CM | POA: Diagnosis not present

## 2022-08-24 DIAGNOSIS — L304 Erythema intertrigo: Secondary | ICD-10-CM | POA: Diagnosis not present

## 2022-08-24 DIAGNOSIS — D72829 Elevated white blood cell count, unspecified: Secondary | ICD-10-CM | POA: Diagnosis not present

## 2022-08-24 DIAGNOSIS — G479 Sleep disorder, unspecified: Secondary | ICD-10-CM

## 2022-08-24 HISTORY — DX: Sleep disorder, unspecified: G47.9

## 2022-08-24 HISTORY — DX: Cervicalgia: M54.2

## 2022-08-24 HISTORY — DX: Other specified dermatitis: L30.8

## 2022-08-24 MED ORDER — NYSTATIN 100000 UNIT/ML MT SUSP: 5.0000 mL | Freq: Four times a day (QID) | OROMUCOSAL | 0 refills | Status: DC

## 2022-08-24 MED ORDER — NAPROXEN 500 MG PO TABS: 500.0000 mg | ORAL_TABLET | Freq: Two times a day (BID) | ORAL | 0 refills | Status: DC

## 2022-08-24 MED ORDER — HYDROXYZINE PAMOATE 25 MG PO CAPS: 25.0000 mg | ORAL_CAPSULE | Freq: Three times a day (TID) | ORAL | 0 refills | Status: DC | PRN

## 2022-08-24 NOTE — Patient Instructions (Addendum)
Follow-Up Appointments: It is important that you make an appointment with Dr. Laurance Flatten to follow up on your MRI results and discuss the nerve conduction study. If you are having challenges with scheduling or insurance, please seek assistance from the clinic staff or Scientist, clinical (histocompatibility and immunogenetics).  Sleep: Your lack of sleep might be exacerbating your symptoms. We've addressed the possibility of a sleep study at home to explore this issue further. Monitoring your sleep could provide valuable insights and help manage your pain better.  Pain Management: Continue to use over-the-counter naproxen to manage your neck pain if it helps and doesn't cause you any issues. Do not exceed the recommended dosage on the package and be aware of any potential side effects.  Physical Therapy and Massage: Keep doing your prescribed physical therapy exercises. However, if certain movements or pressures are causing pain, consult with your physical therapist to adjust the treatment accordingly.  Rash and Skin Concerns: We will give you a prescription for nystatin cream to address the yeast infection under your breasts. For your generalized itching, consider trying Atarax as needed. Monitor for any adverse reactions.  Lab Work: Basic blood work has been ordered to check for any underlying conditions that might be contributing to your symptoms, such as diabetes, B12 deficiency, thyroid disease, liver and renal function. You will be contacted with the results.  General Self-Care: Keep your skin moisturized with products like Aveeno to help alleviate dryness. Be observant of any new or changing symptoms and report them to your healthcare provider promptly.  Communication with Dr. Tawanna Sat Office: Due to the difficulties you've mentioned with Dr. Tawanna Sat office, I'll place a referral to help establish better communication for your follow-up care. Mention to his office that you are open to a virtual consultation if transportation to the office  is difficult.  Medications and Treatments Prescribed:  Nystatin cream for possible yeast infection Atarax as needed for itching Naproxen for neck pain Referral for a sleep study to investigate potential sleep apnea

## 2022-08-24 NOTE — Telephone Encounter (Signed)
Patient requesting a call to go over test (mri & ncs) instead of coming in for a office visit. Patient states its very hard for her to find transportation to get to [facility] and would really appreciate if Dr Laurance Flatten could call her.

## 2022-08-24 NOTE — Assessment & Plan Note (Signed)
Differential diagnosis:  Dermatitis Allergic reaction Psoriasis Systemic disease e.g., liver or kidney disease impacting skin  Plan:  Order comprehensive blood work including CBC, CMP, thyroid function tests to check for systemic causes of itching. Advise continued use of emollients and consider prescribing a steroid cream if rash persists. Give a trial of hydroxyzine (Atarax) for symptomatic relief of itching as needed.

## 2022-08-24 NOTE — Assessment & Plan Note (Signed)
Differential diagnosis:  Cervical spondylotic myelopathy Peripheral neuropathy Diabetic neuropathy  Plan:  Facilitate a follow-up appointment with orthopedic specialist (Dr. Laurance Flatten) to review MRI results and nerve conduction study. Consider a referral for neurosurgical consultation if cervical stenosis is confirmed. Prescribe naproxen as needed for pain management.

## 2022-08-24 NOTE — Assessment & Plan Note (Signed)
Differential diagnosis:  Sleep apnea Insomnia due to chronic pain Restless leg syndrome  Plan:  Refer for sleep study to assess for sleep apnea and other sleep disorders. Encourage good sleep hygiene practices and consider behavioral therapy if indicated.

## 2022-08-24 NOTE — Progress Notes (Signed)
Assessment/Plan:   Problem List Items Addressed This Visit       Respiratory   OSA (obstructive sleep apnea) - Primary     Musculoskeletal and Integument   Pruritic dermatitis    Differential diagnosis:  Dermatitis Allergic reaction Psoriasis Systemic disease e.g., liver or kidney disease impacting skin  Plan:  Order comprehensive blood work including CBC, CMP, thyroid function tests to check for systemic causes of itching. Advise continued use of emollients and consider prescribing a steroid cream if rash persists. Give a trial of hydroxyzine (Atarax) for symptomatic relief of itching as needed.      Relevant Medications   nystatin (MYCOSTATIN) 100000 UNIT/ML suspension   hydrOXYzine (VISTARIL) 25 MG capsule   Other Relevant Orders   Comp Met (CMET)   Urinalysis, Routine w reflex microscopic   Thyroid Panel With TSH   CBC w/Diff     Other   Cervicalgia    Differential diagnosis:  Cervical spondylotic myelopathy Peripheral neuropathy Diabetic neuropathy  Plan:  Facilitate a follow-up appointment with orthopedic specialist (Dr. Laurance Flatten) to review MRI results and nerve conduction study. Consider a referral for neurosurgical consultation if cervical stenosis is confirmed. Prescribe naproxen as needed for pain management.      Relevant Medications   naproxen (NAPROSYN) 500 MG tablet   Sleep disturbance    Differential diagnosis:  Sleep apnea Insomnia due to chronic pain Restless leg syndrome  Plan:  Refer for sleep study to assess for sleep apnea and other sleep disorders. Encourage good sleep hygiene practices and consider behavioral therapy if indicated.      Other Visit Diagnoses     Paresthesia       Relevant Orders   Ambulatory referral to Sleep Studies   Hemoglobin A1C   B12 and Folate Panel   Ambulatory referral to Orthopedic Surgery   Intertrigo       Relevant Medications   nystatin (MYCOSTATIN) 100000 UNIT/ML suspension        Medications Discontinued During This Encounter  Medication Reason   acetaminophen (TYLENOL) 325 MG tablet       Subjective:  HPI: Encounter date: 08/24/2022  Theresa Marquez is a 57 y.o. female who has Family history of thyroid disease; Neck swelling; Obesity, morbid, BMI 40.0-49.9 ([place]); Tobacco abuse; Bilateral lower extremity edema; Recurrent sinus infections; Rhinitis; Congestive heart failure ([place]); Acute exacerbation of CHF (congestive heart failure) ([facility]); OSA (obstructive sleep apnea); AKI (acute kidney injury) ([place]); Wheezing; Chronic midline low back pain with left-sided sciatica; Allergy; Cervicalgia; Sleep disturbance; and Pruritic dermatitis on their problem list..   She  has a past medical history of Allergy and Endometrial polyp (08/09/2017)..   CHIEF COMPLAINT: Patient presents with symptoms of burning in hands and feet, clumsiness, numbness, lower back and leg pain, issues with balance and gate, excessive itching, and rash.  HISTORY OF PRESENT ILLNESS:  Burning Sensation/Clumsiness. Patient reports experiencing burning sensations in hands and feet, which a previous physician suspected might be related to spinal stenosis. The patient underwent an MRI and a nerve conduction study but has not yet followed up on these. Symptoms include dropping objects, a funny gait, and feeling unsteady, as if "on acid."  Sleep Disturbance. The patient reports significant sleep disturbances due to pain, resulting in only 1-2 hours of sleep at a time. Upon waking, the patient experiences complete numbness from the waist down and engages in activities such as walking or showering to alleviate discomfort.  Pain. The patient has difficulty with both standing  and sitting for extended periods due to wobbling legs and pain, which necessitates constant movement for relief. Physical therapy has not been effective in relieving pain.  Skin Issues. Reports of persistent itching all over the body and a  recurring rash described as resembling measles. Additionally, there are repeated yeast infections under the breasts. Application of moisturizer has not helped to alleviate the itching.  ROS:  Neurology: Reports of numbness and impaired gate. Skin: Persistent itching, rash, and repeated yeast infections noted. Musculoskeletal: Pain and weakness in legs, experiencing drop attacks. Sleep: Reports significant sleep disturbance. General: No fever, weight loss, or fatigue mentioned.  Past Surgical History:  Procedure Laterality Date   DG GALL BLADDER Right     Outpatient Medications Prior to Visit  Medication Sig Dispense Refill   albuterol (VENTOLIN HFA) 108 (90 Base) MCG/ACT inhaler Inhale 2 puffs into the lungs every 6 (six) hours as needed for wheezing or shortness of breath. 8 g 0   cetirizine (ZYRTEC) 10 MG tablet Take 10 mg by mouth daily.     furosemide (LASIX) 40 MG tablet Take 40 mg on Monday, Wednesday and Friday. 48 tablet 3   spironolactone (ALDACTONE) 25 MG tablet TAKE 1 TABLET BY MOUTH EVERY DAY 90 tablet 0   ipratropium (ATROVENT) 0.06 % nasal spray Place 2 sprays into both nostrils 2 (two) times daily. 12 mL 2   acetaminophen (TYLENOL) 325 MG tablet Take 2 tablets (650 mg total) by mouth every 6 (six) hours as needed for mild pain, fever or headache. (Patient not taking: Reported on 08/24/2022)     No facility-administered medications prior to visit.    Family History  Problem Relation Age of Onset   Kidney disease Mother    Cancer Father    Cerebral palsy Daughter     Social History   Socioeconomic History   Marital status: Divorced    Spouse name: Not on file   Number of children: 3   Years of education: Not on file   Highest education level: Associate degree: occupational, Hotel manager, or vocational program  Occupational History   Occupation: [employer]    Comment: Full-Time.  Tobacco Use   Smoking status: Every Day    Packs/day: 1.00    Years: 30.00    Total  pack years: 30.00    Types: Cigarettes    Start date: 10/26/1969   Smokeless tobacco: Never   Tobacco comments:    Smoking cessation  Vaping Use   Vaping Use: Never used  Substance and Sexual Activity   Alcohol use: Not Currently    Comment: socially   Drug use: Never   Sexual activity: Not Currently  Other Topics Concern   Not on file  Social History Narrative   Not on file   Social Determinants of Health   Financial Resource Strain: Low Risk  (12/04/2021)   Overall Financial Resource Strain (CARDIA)    Difficulty of Paying Living Expenses: Not very hard  Food Insecurity: No Food Insecurity (12/04/2021)   Hunger Vital Sign    Worried About Running Out of Food in the Last Year: Never true    Ran Out of Food in the Last Year: Never true  Transportation Needs: No Transportation Needs (12/04/2021)   PRAPARE - Hydrologist (Medical): No    Lack of Transportation (Non-Medical): No  Physical Activity: Not on file  Stress: Not on file  Social Connections: Not on file  Intimate Partner Violence: Not on file  Objective:  Physical Exam: BP 132/84 (BP Location: Left Arm, Patient Position: Sitting, Cuff Size: Large)   Pulse 77   Temp 97.6 F (36.4 C) (Temporal)   Wt 272 lb 9.6 oz (123.7 kg)   SpO2 99%   BMI 46.79 kg/m    General: No acute distress. Awake and conversant.  Eyes: Normal conjunctiva, anicteric. Round symmetric pupils.  ENT: Hearing grossly intact. No nasal discharge.  Neck: Neck is supple. No masses or thyromegaly.  Respiratory: Respirations are non-labored. No auditory wheezing.  Skin: Warm. No rashes or ulcers.  Psych: Alert and oriented. Cooperative, Appropriate mood and affect, Normal judgment.  CV: No cyanosis or JVD MSK: Normal ambulation. No clubbing  Neuro: Sensation and CN II-XII grossly normal.       Alesia Banda, MD,  MS

## 2022-08-25 LAB — CBC WITH DIFFERENTIAL/PLATELET
Basophils Absolute: 0.1 10*3/uL (ref 0.0–0.1)
Basophils Relative: 1 % (ref 0.0–3.0)
Eosinophils Absolute: 0.2 10*3/uL (ref 0.0–0.7)
Eosinophils Relative: 1.7 % (ref 0.0–5.0)
HCT: 44.1 % (ref 36.0–46.0)
Hemoglobin: 14.7 g/dL (ref 12.0–15.0)
Lymphocytes Relative: 21.3 % (ref 12.0–46.0)
Lymphs Abs: 3 10*3/uL (ref 0.7–4.0)
MCHC: 33.4 g/dL (ref 30.0–36.0)
MCV: 90.8 fl (ref 78.0–100.0)
Monocytes Absolute: 0.9 10*3/uL (ref 0.1–1.0)
Monocytes Relative: 6.7 % (ref 3.0–12.0)
Neutro Abs: 9.6 10*3/uL — ABNORMAL HIGH (ref 1.4–7.7)
Neutrophils Relative %: 69.3 % (ref 43.0–77.0)
Platelets: 318 10*3/uL (ref 150.0–400.0)
RBC: 4.86 Mil/uL (ref 3.87–5.11)
RDW: 14.7 % (ref 11.5–15.5)
WBC: 13.9 10*3/uL — ABNORMAL HIGH (ref 4.0–10.5)

## 2022-08-25 LAB — THYROID PANEL WITH TSH
Free Thyroxine Index: 2 (ref 1.4–3.8)
T3 Uptake: 28 % (ref 22–35)
T4, Total: 7.1 ug/dL (ref 5.1–11.9)
TSH: 2.41 mIU/L (ref 0.40–4.50)

## 2022-08-25 LAB — B12 AND FOLATE PANEL
Folate: 5.4 ng/mL — ABNORMAL LOW (ref >5.9)
Vitamin B-12: 183 pg/mL — ABNORMAL LOW (ref 211–911)

## 2022-08-25 LAB — URINALYSIS, ROUTINE W REFLEX MICROSCOPIC
Bilirubin Urine: NEGATIVE
Hgb urine dipstick: NEGATIVE
Ketones, ur: NEGATIVE
Leukocytes,Ua: NEGATIVE
Nitrite: NEGATIVE
RBC / HPF: NONE SEEN (ref 0–?)
Specific Gravity, Urine: 1.02 (ref 1.000–1.030)
Total Protein, Urine: NEGATIVE
Urine Glucose: NEGATIVE
Urobilinogen, UA: 0.2 (ref 0.0–1.0)
pH: 6.5 (ref 5.0–8.0)

## 2022-08-25 LAB — COMPREHENSIVE METABOLIC PANEL
ALT: 15 U/L (ref 0–35)
AST: 14 U/L (ref 0–37)
Albumin: 3.3 g/dL — ABNORMAL LOW (ref 3.5–5.2)
Alkaline Phosphatase: 95 U/L (ref 39–117)
BUN: 15 mg/dL (ref 6–23)
CO2: 28 mEq/L (ref 19–32)
Calcium: 9.3 mg/dL (ref 8.4–10.5)
Chloride: 103 mEq/L (ref 96–112)
Creatinine, Ser: 0.77 mg/dL (ref 0.40–1.20)
GFR: 85.87 mL/min (ref >60.00)
Glucose, Bld: 95 mg/dL (ref 70–99)
Potassium: 3.9 mEq/L (ref 3.5–5.1)
Sodium: 140 mEq/L (ref 135–145)
Total Bilirubin: 0.3 mg/dL (ref 0.2–1.2)
Total Protein: 6.7 g/dL (ref 6.0–8.3)

## 2022-08-25 LAB — HEMOGLOBIN A1C: Hgb A1c MFr Bld: 6.4 % (ref 4.6–6.5)

## 2022-08-25 NOTE — Telephone Encounter (Signed)
I called and made her a telephone appt for 08/31/22 @ 1pm

## 2022-08-26 ENCOUNTER — Telehealth: Payer: Self-pay | Admitting: Family Medicine

## 2022-08-26 MED ORDER — VITAMIN B-12 1000 MCG PO TABS: 1000.0000 ug | ORAL_TABLET | Freq: Every day | ORAL | Status: DC

## 2022-08-26 MED ORDER — HYDROXYZINE PAMOATE 25 MG PO CAPS: 25.0000 mg | ORAL_CAPSULE | Freq: Three times a day (TID) | ORAL | 0 refills | Status: DC | PRN

## 2022-08-26 MED ORDER — NYSTATIN 100000 UNIT/ML MT SUSP: 5.0000 mL | Freq: Four times a day (QID) | OROMUCOSAL | 0 refills | Status: DC

## 2022-08-26 MED ORDER — NAPROXEN 500 MG PO TABS: 500.0000 mg | ORAL_TABLET | Freq: Two times a day (BID) | ORAL | 0 refills | Status: DC

## 2022-08-26 MED ORDER — FOLIC ACID 1 MG PO TABS: 1.0000 mg | ORAL_TABLET | Freq: Every day | ORAL | Status: DC

## 2022-08-26 NOTE — Telephone Encounter (Signed)
Rx's printed and signed by PCP and faxed out to pharmacy

## 2022-08-26 NOTE — Addendum Note (Signed)
Addended by: Josephine Igo B on: 08/26/2022 12:54 PM   Modules accepted: Orders

## 2022-08-26 NOTE — Telephone Encounter (Signed)
Pt called to say the pharmacy can not receive e faxes right now  something off in the system.I see where the first did not go through.  Pt would like for you to call them in or fax by paper. She needs the 3 from her recent visit as well as all her meds except for the albuterol.

## 2022-08-26 NOTE — Addendum Note (Signed)
Addended by: Renaee Munda on: 08/26/2022 03:14 PM   Modules accepted: Orders

## 2022-08-30 ENCOUNTER — Other Ambulatory Visit: Payer: Self-pay | Admitting: Family

## 2022-08-30 DIAGNOSIS — D72829 Elevated white blood cell count, unspecified: Secondary | ICD-10-CM

## 2022-08-31 ENCOUNTER — Encounter: Payer: Self-pay | Admitting: Family

## 2022-08-31 ENCOUNTER — Telehealth: Payer: Medicaid Other | Admitting: Orthopedic Surgery

## 2022-08-31 ENCOUNTER — Inpatient Hospital Stay: Payer: Medicaid Other | Attending: Hematology & Oncology

## 2022-08-31 ENCOUNTER — Inpatient Hospital Stay (HOSPITAL_BASED_OUTPATIENT_CLINIC_OR_DEPARTMENT_OTHER): Payer: Medicaid Other | Admitting: Family

## 2022-08-31 VITALS — BP 125/77 | HR 96 | Temp 98.1°F | Resp 20 | Ht 63.25 in | Wt 273.0 lb

## 2022-08-31 DIAGNOSIS — D72829 Elevated white blood cell count, unspecified: Secondary | ICD-10-CM

## 2022-08-31 DIAGNOSIS — I509 Heart failure, unspecified: Secondary | ICD-10-CM

## 2022-08-31 DIAGNOSIS — R718 Other abnormality of red blood cells: Secondary | ICD-10-CM

## 2022-08-31 DIAGNOSIS — F17210 Nicotine dependence, cigarettes, uncomplicated: Secondary | ICD-10-CM

## 2022-08-31 LAB — CMP (CANCER CENTER ONLY)
ALT: 15 U/L (ref 0–44)
AST: 20 U/L (ref 15–41)
Albumin: 3.3 g/dL — ABNORMAL LOW (ref 3.5–5.0)
Alkaline Phosphatase: 101 U/L (ref 38–126)
Anion gap: 7 (ref 5–15)
BUN: 11 mg/dL (ref 6–20)
CO2: 27 mmol/L (ref 22–32)
Calcium: 9.1 mg/dL (ref 8.9–10.3)
Chloride: 100 mmol/L (ref 98–111)
Creatinine: 0.87 mg/dL (ref 0.44–1.00)
GFR, Estimated: >60 mL/min (ref >60)
Glucose, Bld: 118 mg/dL — ABNORMAL HIGH (ref 70–99)
Potassium: 3.9 mmol/L (ref 3.5–5.1)
Sodium: 134 mmol/L — ABNORMAL LOW (ref 135–145)
Total Bilirubin: 0.5 mg/dL (ref 0.3–1.2)
Total Protein: 7.8 g/dL (ref 6.5–8.1)

## 2022-08-31 LAB — CBC WITH DIFFERENTIAL (CANCER CENTER ONLY)
Abs Immature Granulocytes: 0.1 10*3/uL — ABNORMAL HIGH (ref 0.00–0.07)
Basophils Absolute: 0.1 10*3/uL (ref 0.0–0.1)
Basophils Relative: 0 %
Eosinophils Absolute: 0.2 10*3/uL (ref 0.0–0.5)
Eosinophils Relative: 2 %
HCT: 46.4 % — ABNORMAL HIGH (ref 36.0–46.0)
Hemoglobin: 14.8 g/dL (ref 12.0–15.0)
Immature Granulocytes: 1 %
Lymphocytes Relative: 25 %
Lymphs Abs: 3.5 10*3/uL (ref 0.7–4.0)
MCH: 29.5 pg (ref 26.0–34.0)
MCHC: 31.9 g/dL (ref 30.0–36.0)
MCV: 92.6 fL (ref 80.0–100.0)
Monocytes Absolute: 0.8 10*3/uL (ref 0.1–1.0)
Monocytes Relative: 6 %
Neutro Abs: 9.3 10*3/uL — ABNORMAL HIGH (ref 1.7–7.7)
Neutrophils Relative %: 66 %
Platelet Count: 350 10*3/uL (ref 150–400)
RBC: 5.01 MIL/uL (ref 3.87–5.11)
RDW: 14.1 % (ref 11.5–15.5)
WBC Count: 14 10*3/uL — ABNORMAL HIGH (ref 4.0–10.5)
nRBC: 0 % (ref 0.0–0.2)

## 2022-08-31 LAB — IRON AND IRON BINDING CAPACITY (CC-WL,HP ONLY)
Iron: 67 ug/dL (ref 28–170)
Saturation Ratios: 20 % (ref 10.4–31.8)
TIBC: 337 ug/dL (ref 250–450)
UIBC: 270 ug/dL

## 2022-08-31 LAB — SAVE SMEAR(SSMR), FOR PROVIDER SLIDE REVIEW

## 2022-08-31 LAB — LACTATE DEHYDROGENASE: LDH: 136 U/L (ref 98–192)

## 2022-08-31 LAB — FERRITIN: Ferritin: 63 ng/mL (ref 11–307)

## 2022-08-31 NOTE — Progress Notes (Signed)
Hematology/Oncology Consultation   Name: Theresa Marquez      MRN: [id number]    Location: Room/bed info not found  Date: 08/31/2022 Time:3:01 PM   REFERRING PHYSICIAN: Josephine Igo, MD  REASON FOR CONSULT:  Leukocytosis    DIAGNOSIS: Mild leukocytosis   HISTORY OF PRESENT ILLNESS: Theresa Marquez is a very pleasant 57 yo female with mild leukocytosis first noted back in 02/2015.  WBC count today is 14.0, neutrophils 66% and lymphocytes 25%. Hgb 14.8, MCV 92% and platelets 350.  She stated that she suffers with joint and muscle pain and weakness. This especially effects her arms and hands.  She has chronic swelling in her hands and lower extremities and has been diagnosed with CHF. She is currently on Lasix and spironolactone.  She has also had dizziness, mild SOB with over exertion and a buzzing sound in her ears and blurry vision which she states that has had most of her life.  She denies issues with HTN.  She also has an issue with a red raised sand paper like rash that comes up all over and is very itchy.  No blood loss noted. No abnormal bruising or petechiae. She has some broken capillaries along the bottom of her right knee.  She has facial spasms as she is falling asleep as well as restless legs.  No history of diabetes or thyroid disease.  No personal history of cancer. She states that her father was a English as a second language teacher and was exposed to many dangerous chemicals. Over the course of his life he suffered from 4 separate primaries: colon, renal, rectal and SCLC.  She states that she has never had a colonoscopy due to all her other health issues and feeling overwhelmed.  She is also due for her mammogram.  No fever, chills, cough, chest pain, palpitations, abdominal pain or changes in bowel or bladder habits.  She is a smoker and is down to 1/4 ppd. No ETOH or recreational drug use.  She has no appetite but is making her self eat each day. She has intermittent episodes of nausea. She is doing her best  to stay properly hydrated. Her weight is 273 lbs.  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 She has started the application process for disability.   ROS: All other 10 point review of systems is negative.   PAST MEDICAL HISTORY:   Past Medical History:  Diagnosis Date   Allergy    Endometrial polyp 08/09/2017   Formatting of this note might be different from the original. S/p hysteroscopy with [facility] 10/2017 with Dr Doreene Nest, was benign    ALLERGIES: Allergies  Allergen Reactions   Codeine Other (See Comments)    Hallucinations  Hallucinates    Prednisone Swelling      MEDICATIONS:  Current Outpatient Medications on File Prior to Visit  Medication Sig Dispense Refill   albuterol (VENTOLIN HFA) 108 (90 Base) MCG/ACT inhaler Inhale 2 puffs into the lungs every 6 (six) hours as needed for wheezing or shortness of breath. 8 g 0   cetirizine (ZYRTEC) 10 MG tablet Take 10 mg by mouth daily.     cyanocobalamin (VITAMIN B12) 1000 MCG tablet Take 1 tablet (1,000 mcg total) by mouth daily.     folic acid (FOLVITE) 1 MG tablet Take 1 tablet (1 mg total) by mouth daily.     furosemide (LASIX) 40 MG tablet Take 40 mg on Monday, Wednesday and  Friday. 48 tablet 3   hydrOXYzine (VISTARIL) 25 MG capsule Take 1 capsule (25 mg total) by mouth every 8 (eight) hours as needed. 30 capsule 0   ipratropium (ATROVENT) 0.06 % nasal spray Place 2 sprays into both nostrils 2 (two) times daily. 12 mL 2   naproxen (NAPROSYN) 500 MG tablet Take 1 tablet (500 mg total) by mouth 2 (two) times daily with a meal. 30 tablet 0   nystatin (MYCOSTATIN) 100000 UNIT/ML suspension Take 5 mLs (500,000 Units total) by mouth 4 (four) times daily. 60 mL 0   spironolactone (ALDACTONE) 25 MG tablet TAKE 1 TABLET BY MOUTH EVERY DAY 90 tablet 0   No current facility-administered medications on file prior to visit.     PAST SURGICAL HISTORY Past Surgical History:  Procedure  Laterality Date   DG GALL BLADDER Right     FAMILY HISTORY: Family History  Problem Relation Age of Onset   Kidney disease Mother    Cancer Father    Cerebral palsy Daughter     SOCIAL HISTORY:  reports that she has been smoking cigarettes. She started smoking about 52 years ago. She has a 30.00 pack-year smoking history. She has never used smokeless tobacco. She reports that she does not currently use alcohol. She reports that she does not use drugs.  PERFORMANCE STATUS: The patient's performance status is 1 - Symptomatic but completely ambulatory  PHYSICAL EXAM: Most Recent Vital Signs: Blood pressure 125/77, pulse 96, temperature 98.1 F (36.7 C), temperature source Oral, resp. rate 20, height 5' 3.25" (1.607 m), weight 273 lb (123.8 kg), SpO2 98 %. BP 125/77 (BP Location: Left Arm, Patient Position: Sitting)   Pulse 96   Temp 98.1 F (36.7 C) (Oral)   Resp 20   Ht 5' 3.25" (1.607 m)   Wt 273 lb (123.8 kg)   SpO2 98%   BMI 47.98 kg/m   General Appearance:    Alert, cooperative, no distress, appears stated age  Head:    Normocephalic, without obvious abnormality, atraumatic  Eyes:    PERRL, conjunctiva/corneas clear, EOM's intact, fundi    benign, both eyes        Throat:   Lips, mucosa, and tongue normal; teeth and gums normal  Neck:   Supple, symmetrical, trachea midline, no adenopathy;    thyroid:  no enlargement/tenderness/nodules; no carotid   bruit or JVD  Back:     Symmetric, no curvature, ROM normal, no CVA tenderness  Lungs:     Clear to auscultation bilaterally, respirations unlabored  Chest Wall:    No tenderness or deformity   Heart:    Regular rate and rhythm, S1 and S2 normal, no murmur, rub   or gallop     Abdomen:     Soft, non-tender, bowel sounds active all four quadrants,    no masses, no organomegaly        Extremities:   Extremities normal, atraumatic, no cyanosis or edema  Pulses:   2+ and symmetric all extremities  Skin:   Skin color,  texture, turgor normal, no rashes or lesions  Lymph nodes:   Cervical, supraclavicular, and axillary nodes normal  Neurologic:   CNII-XII intact, normal strength, sensation and reflexes    throughout    LABORATORY DATA:  Results for orders placed or performed in visit on 08/31/22 (from the past 48 hour(s))  CBC with Differential (Cancer Center Only)     Status: Abnormal   Collection Time: 08/31/22  2:46 PM  Result Value  Ref Range   WBC Count 14.0 (H) 4.0 - 10.5 K/uL   RBC 5.01 3.87 - 5.11 MIL/uL   Hemoglobin 14.8 12.0 - 15.0 g/dL   HCT 46.4 (H) 36.0 - 46.0 %   MCV 92.6 80.0 - 100.0 fL   MCH 29.5 26.0 - 34.0 pg   MCHC 31.9 30.0 - 36.0 g/dL   RDW 14.1 11.5 - 15.5 %   Platelet Count 350 150 - 400 K/uL   nRBC 0.0 0.0 - 0.2 %   Neutrophils Relative % 66 %   Neutro Abs 9.3 (H) 1.7 - 7.7 K/uL   Lymphocytes Relative 25 %   Lymphs Abs 3.5 0.7 - 4.0 K/uL   Monocytes Relative 6 %   Monocytes Absolute 0.8 0.1 - 1.0 K/uL   Eosinophils Relative 2 %   Eosinophils Absolute 0.2 0.0 - 0.5 K/uL   Basophils Relative 0 %   Basophils Absolute 0.1 0.0 - 0.1 K/uL   Immature Granulocytes 1 %   Abs Immature Granulocytes 0.10 (H) 0.00 - 0.07 K/uL    Comment: Performed at [facility] at [facility], [address], [place], [place] [address]  Save Smear for Provider Slide Review     Status: None   Collection Time: 08/31/22  2:46 PM  Result Value Ref Range   Smear Review SMEAR STAINED AND AVAILABLE FOR REVIEW     Comment: Performed at [facility] at [facility], [address] [place], [place] [address]      RADIOGRAPHY: No results found.     PATHOLOGY: None  ASSESSMENT/PLAN: Ms. Sisler is a very pleasant 57 yo female with mild leukocytosis first noted back in 02/2015.  Blood smear and CBC reviewed with Dr. Marin Olp. No abnormality or evidence of malignancy noted.  Iron studies look good.  We will have her come back in for JAK 2 testing this  week.  Follow-up pending results.   All questions were answered. The patient knows to call the clinic with any problems, questions or concerns. We can certainly see the patient much sooner if necessary.  The patient was discussed with Dr. Marin Olp and he is in agreement with the aforementioned.   Lottie Dawson, NP

## 2022-08-31 NOTE — Progress Notes (Signed)
Social worker advised for f/u.

## 2022-09-01 ENCOUNTER — Inpatient Hospital Stay: Payer: Medicaid Other | Admitting: Licensed Clinical Social Worker

## 2022-09-01 NOTE — Progress Notes (Signed)
[place] Work  Clinical Social Work was referred by nurse for assessment of psychosocial needs.  Clinical Social Worker contacted patient by phone to offer support and assess for needs.    Patient stated she is having difficulty with transportation.  Verified patient has Medicaid and provided her with the contact information  for her transportation benefit.  She applied for disability in August and has not received any information.  Encouraged her to contact the lawyer she utilized to apply and that they should be able to tell her the status of her application.  Provided her with CSW contact information for future issues.     Margaree Mackintosh, LCSW  Clinical Social Worker [place]        Patient is participating in a Managed Medicaid Plan:  Yes

## 2022-09-08 ENCOUNTER — Inpatient Hospital Stay: Payer: Medicaid Other

## 2022-09-08 DIAGNOSIS — R718 Other abnormality of red blood cells: Secondary | ICD-10-CM

## 2022-09-08 DIAGNOSIS — D72829 Elevated white blood cell count, unspecified: Secondary | ICD-10-CM

## 2022-09-09 ENCOUNTER — Other Ambulatory Visit: Payer: Self-pay | Admitting: Family Medicine

## 2022-09-09 DIAGNOSIS — I509 Heart failure, unspecified: Secondary | ICD-10-CM

## 2022-09-09 DIAGNOSIS — J329 Chronic sinusitis, unspecified: Secondary | ICD-10-CM

## 2022-09-09 MED ORDER — SPIRONOLACTONE 25 MG PO TABS: 25.0000 mg | ORAL_TABLET | Freq: Every day | ORAL | 0 refills | Status: DC

## 2022-09-09 MED ORDER — IPRATROPIUM BROMIDE 0.06 % NA SOLN: 2.0000 | Freq: Two times a day (BID) | NASAL | 2 refills | Status: DC

## 2022-09-09 MED ORDER — CETIRIZINE HCL 10 MG PO TABS: 10.0000 mg | ORAL_TABLET | Freq: Every day | ORAL | 1 refills | Status: DC

## 2022-09-20 LAB — JAK 2 V617F (GENPATH)

## 2022-09-22 ENCOUNTER — Telehealth: Payer: Self-pay | Admitting: Family

## 2022-09-22 DIAGNOSIS — D72829 Elevated white blood cell count, unspecified: Secondary | ICD-10-CM

## 2022-09-22 DIAGNOSIS — R718 Other abnormality of red blood cells: Secondary | ICD-10-CM

## 2022-09-22 NOTE — Telephone Encounter (Signed)
I was able to speak with the patient and go over recent lab work in detail. JAK 2 was negative. At this time we recommend she speak with her PCP about seeing rheumatology for her muscle pain and weakness and intermittent rash.  We will see her again in 6 months. If lab work remains stable we will release her back to her PCP.  No questions at this time. Patient appreciative of call.

## 2022-10-03 ENCOUNTER — Other Ambulatory Visit: Payer: Self-pay | Admitting: Family Medicine

## 2022-10-03 DIAGNOSIS — I509 Heart failure, unspecified: Secondary | ICD-10-CM

## 2022-10-03 DIAGNOSIS — L308 Other specified dermatitis: Secondary | ICD-10-CM

## 2022-10-03 DIAGNOSIS — M542 Cervicalgia: Secondary | ICD-10-CM

## 2022-10-04 MED ORDER — HYDROXYZINE PAMOATE 25 MG PO CAPS: 25.0000 mg | ORAL_CAPSULE | Freq: Three times a day (TID) | ORAL | 0 refills | Status: DC | PRN

## 2022-10-04 MED ORDER — NAPROXEN 500 MG PO TABS: 500.0000 mg | ORAL_TABLET | Freq: Two times a day (BID) | ORAL | 0 refills | Status: DC

## 2022-10-12 ENCOUNTER — Telehealth: Payer: Self-pay | Admitting: Family Medicine

## 2022-10-12 NOTE — Telephone Encounter (Signed)
Pt has labs to be seen on 10/13/22 for labs, she is not feeling well. She is wondering if she should reschedule her appt. Please advise pt at [phone].

## 2022-10-12 NOTE — Telephone Encounter (Signed)
Spoke with Dr. Janee Morn verbally and reviewed the chart and message together. He stated that we can reschedule lab visit for patient so it won't affect her labs. I offered patient an OV for sinus congestion she declined and states she will self treat and call back if symptoms worsen.

## 2022-10-12 NOTE — Telephone Encounter (Signed)
Patient scheduled tomorrow for lab work for C-REACTIVE PROTEIN, SED RATE, B12&FOLATE PANEL. She states she has a sore throat with congestion. She inquired if she's not feeling well would this affect the results of the labs? Please advise.

## 2022-10-13 ENCOUNTER — Other Ambulatory Visit: Payer: Medicaid Other

## 2022-10-20 ENCOUNTER — Other Ambulatory Visit (INDEPENDENT_AMBULATORY_CARE_PROVIDER_SITE_OTHER): Payer: Medicaid Other

## 2022-10-20 DIAGNOSIS — E538 Deficiency of other specified B group vitamins: Secondary | ICD-10-CM

## 2022-10-20 DIAGNOSIS — D72829 Elevated white blood cell count, unspecified: Secondary | ICD-10-CM | POA: Diagnosis not present

## 2022-10-20 NOTE — Addendum Note (Signed)
Addended by: Trudee Kuster on: 10/20/2022 02:51 PM   Modules accepted: Orders

## 2022-10-21 LAB — B12 AND FOLATE PANEL
Folate: 5.6 ng/mL — ABNORMAL LOW (ref >5.9)
Vitamin B-12: 379 pg/mL (ref 211–911)

## 2022-10-21 LAB — C-REACTIVE PROTEIN: CRP: 4.3 mg/dL (ref 0.5–20.0)

## 2022-10-21 LAB — SEDIMENTATION RATE: Sed Rate: 66 mm/hr — ABNORMAL HIGH (ref 0–30)

## 2022-11-10 ENCOUNTER — Encounter: Payer: Self-pay | Admitting: Family Medicine

## 2022-11-10 ENCOUNTER — Ambulatory Visit (INDEPENDENT_AMBULATORY_CARE_PROVIDER_SITE_OTHER): Payer: Medicaid Other | Admitting: Family Medicine

## 2022-11-10 VITALS — BP 136/86 | HR 74 | Temp 97.7°F | Wt 281.8 lb

## 2022-11-10 DIAGNOSIS — M791 Myalgia, unspecified site: Secondary | ICD-10-CM

## 2022-11-10 DIAGNOSIS — R569 Unspecified convulsions: Secondary | ICD-10-CM | POA: Diagnosis not present

## 2022-11-10 DIAGNOSIS — M542 Cervicalgia: Secondary | ICD-10-CM

## 2022-11-10 DIAGNOSIS — J309 Allergic rhinitis, unspecified: Secondary | ICD-10-CM | POA: Diagnosis not present

## 2022-11-10 DIAGNOSIS — I509 Heart failure, unspecified: Secondary | ICD-10-CM | POA: Diagnosis not present

## 2022-11-10 DIAGNOSIS — R600 Localized edema: Secondary | ICD-10-CM

## 2022-11-10 DIAGNOSIS — L308 Other specified dermatitis: Secondary | ICD-10-CM | POA: Diagnosis not present

## 2022-11-10 DIAGNOSIS — R29898 Other symptoms and signs involving the musculoskeletal system: Secondary | ICD-10-CM | POA: Diagnosis not present

## 2022-11-10 DIAGNOSIS — M792 Neuralgia and neuritis, unspecified: Secondary | ICD-10-CM | POA: Diagnosis not present

## 2022-11-10 DIAGNOSIS — R700 Elevated erythrocyte sedimentation rate: Secondary | ICD-10-CM

## 2022-11-10 DIAGNOSIS — M7989 Other specified soft tissue disorders: Secondary | ICD-10-CM

## 2022-11-10 MED ORDER — SPIRONOLACTONE 25 MG PO TABS
25.0000 mg | ORAL_TABLET | Freq: Every day | ORAL | 3 refills | Status: DC
Start: 2022-11-10 — End: 2023-11-29

## 2022-11-10 MED ORDER — FUROSEMIDE 40 MG PO TABS: ORAL_TABLET | ORAL | 3 refills | Status: DC

## 2022-11-10 MED ORDER — HYDROXYZINE PAMOATE 25 MG PO CAPS: 25.0000 mg | ORAL_CAPSULE | Freq: Three times a day (TID) | ORAL | 0 refills | Status: DC | PRN

## 2022-11-10 MED ORDER — CETIRIZINE HCL 10 MG PO TABS
10.0000 mg | ORAL_TABLET | Freq: Every day | ORAL | 3 refills | Status: DC
Start: 2022-11-10 — End: 2023-03-01

## 2022-11-10 MED ORDER — DULOXETINE HCL 20 MG PO CPEP
ORAL_CAPSULE | ORAL | 0 refills | Status: DC
Start: 2022-11-10 — End: 2022-12-08

## 2022-11-10 MED ORDER — NAPROXEN 500 MG PO TABS
500.0000 mg | ORAL_TABLET | Freq: Two times a day (BID) | ORAL | 0 refills | Status: AC
Start: 2022-11-10 — End: 2023-02-08

## 2022-11-10 NOTE — Patient Instructions (Signed)
We are getting labs as discussed.  We are referring to neurology and rheumatology as discussed. For neuropathic pain, we are starting duloxetine. We have refilled your other medications as requested.

## 2022-11-10 NOTE — Progress Notes (Signed)
Assessment/Plan:   Problem List Items Addressed This Visit       Cardiovascular and Mediastinum   Congestive heart failure (HCC)   Relevant Medications   spironolactone (ALDACTONE) 25 MG tablet   furosemide (LASIX) 40 MG tablet     Respiratory   Rhinitis   Relevant Medications   cetirizine (ZYRTEC) 10 MG tablet     Musculoskeletal and Integument   Pruritic dermatitis   Relevant Medications   hydrOXYzine (VISTARIL) 25 MG capsule     Other   Bilateral lower extremity edema    Plan: Continue Spironolactone (ALDACTONE) 25 MG tablet daily. Continue Furosemide (LASIX) 40 MG tablet three times a week. Monitor electrolytes and renal function.      Cervicalgia   Relevant Medications   naproxen (NAPROSYN) 500 MG tablet   Weakness of extremity - Primary    Differential Diagnosis:  Cervical or thoracic spinal pathology (cervical radiculopathy) Neurological disorders (Multiple sclerosis, ALS) Autoimmune diseases (Myasthenia gravis, Polymyositis) Peripheral neuropathy (diabetic neuropathy, idiopathic)  Plan: Ambulatory referral to Neurology for further evaluation. Order labs including Aldolase, CK , HLA-B27 antigen, ANA w/Reflex, and RheumAssure. Start Duloxetine (CYMBALTA) 20 MG capsule once daily.      Relevant Orders   Ambulatory referral to Neurology   Seizure-like activity [facility])    Epilepsy Non-epileptic spasms (PNES) Metabolic disorders (electrolyte imbalance) Psychogenic nonepileptic seizures  Plan:  Ambulatory referral to Neurology for comprehensive seizure evaluation. Review previous neuroimaging and nerve conduction studies.      Relevant Orders   Ambulatory referral to Neurology   Other Visit Diagnoses     Myalgia       Relevant Orders   Aldolase (Completed)   CK (Creatine Kinase) (Completed)   HLA-B27 antigen (Completed)   ANA w/Reflex (Completed)   RheumAssure (Completed)   Neuralgia       Relevant Medications   DULoxetine (CYMBALTA) 20 MG  capsule   Other Relevant Orders   Hepatitis B core antibody, total (Completed)   Hepatitis B surface antibody,qualitative (Completed)   Hepatitis B surface antigen (Completed)   Hepatitis C Antibody (Completed)   Elevated sed rate (elev SR)       Relevant Orders   Ambulatory referral to Rheumatology   Leg swelling       Relevant Medications   spironolactone (ALDACTONE) 25 MG tablet   furosemide (LASIX) 40 MG tablet       Medications Discontinued During This Encounter  Medication Reason   furosemide (LASIX) 40 MG tablet Reorder   cetirizine (ZYRTEC) 10 MG tablet Reorder   spironolactone (ALDACTONE) 25 MG tablet Reorder   hydrOXYzine (VISTARIL) 25 MG capsule Reorder   naproxen (NAPROSYN) 500 MG tablet Reorder   hydrOXYzine (VISTARIL) 25 MG capsule Reorder    Return in about 4 weeks (around 12/08/2022) for Neuropathic pain follow-up (may be virtual).    Subjective:   Encounter date: 11/10/2022  Theresa Marquez is a 57 y.o. female who has Family history of thyroid disease; Neck swelling; Obesity, morbid, BMI 40.0-49.9 (HCC); Tobacco abuse; Bilateral lower extremity edema; Recurrent sinus infections; Rhinitis; Congestive heart failure (HCC); Acute exacerbation of CHF (congestive heart failure) (HCC); OSA (obstructive sleep apnea); AKI (acute kidney injury) (HCC); Wheezing; Chronic midline low back pain with left-sided sciatica; Allergy; Cervicalgia; Sleep disturbance; Pruritic dermatitis; Weakness of extremity; and Seizure-like activity (HCC) on their problem list..   She  has a past medical history of Allergy and Endometrial polyp (08/09/2017)..   CHIEF COMPLAINT: Patient presents with symptoms of burning in  hands and feet, clumsiness, numbness, lower back and leg pain, issues with balance and gait, excessive itching, rash, ongoing/worsening balance, pain, and muscle weakness, particularly on the left side, and facial tics.  HISTORY OF PRESENT ILLNESS:  The patient reports  experiencing burning sensations in the hands and feet, suspected to be related to spinal stenosis as indicated by previous cervical MRI and inconclusive EMG of the upper extremities. Symptoms include dropping objects, a funny gait, and unsteadiness akin to feeling "on acid." Continual muscle pain now affects both upper and lower extremities, where the arm turns fire red, burns, and swells, accompanied by a rash. There is significant loss of mobility, difficulty lifting the left leg, and muscle weakness preventing daily activities like standing or walking for extended periods. Additionally, the patient describes intermittent convulsive movements, facial tics, and episodes of sudden convulsions resembling seizures while lying in bed. Random vision problems such as blurry vision and loss of peripheral vision have also been reported, with previous diagnoses considerations including carpal tunnel, cervical radiculopathy, and spine-related issues.  Significant sleep disturbances due to pain result in only 1-2 hours of sleep at a time, with the patient experiencing complete numbness from the waist down upon waking, necessitating walking or showering to alleviate discomfort. Severe pain and swelling persist in the back and legs, described as burning and sometimes feeling like being in a vice, with current medications offering minimal relief. Persistent itching all over the body and a recurring rash resembling measles are reported, along with repeated yeast infections under the breasts where moisturizers do not alleviate the itching. Facial swelling around the jaw and ears correlates with allergy exposure, lasting several weeks and often associated with tongue and oral swelling, impacting her ability to eat and speak. Psychological and functional impacts are significant, with the patient feeling frustrated and desperate for a diagnosis as symptoms severely affect daily life, leaving her unable to perform basic  tasks.  Review of Systems  Constitutional:  Negative for chills, diaphoresis, fever, malaise/fatigue and weight loss.  HENT:  Positive for congestion and sore throat. Negative for ear discharge, ear pain and hearing loss.   Eyes:  Positive for blurred vision. Negative for double vision, photophobia, pain, discharge and redness.  Respiratory:  Negative for cough, sputum production, shortness of breath and wheezing.   Cardiovascular:  Positive for leg swelling. Negative for chest pain and palpitations.  Gastrointestinal:  Negative for abdominal pain, blood in stool, constipation, diarrhea, heartburn, melena, nausea and vomiting.  Genitourinary:  Negative for dysuria, flank pain, frequency, hematuria and urgency.  Musculoskeletal:  Positive for back pain and myalgias.  Skin:  Positive for rash. Negative for itching.  Neurological:  Positive for dizziness, tremors, sensory change, seizures and weakness. Negative for tingling, speech change, loss of consciousness and headaches.  Psychiatric/Behavioral:  Positive for depression. Negative for hallucinations, memory loss, substance abuse and suicidal ideas. The patient is nervous/anxious and has insomnia.   All other systems reviewed and are negative.   Past Surgical History:  Procedure Laterality Date   DG GALL BLADDER Right     Outpatient Medications Prior to Visit  Medication Sig Dispense Refill   albuterol (VENTOLIN HFA) 108 (90 Base) MCG/ACT inhaler Inhale 2 puffs into the lungs every 6 (six) hours as needed for wheezing or shortness of breath. 8 g 0   ipratropium (ATROVENT) 0.06 % nasal spray Place 2 sprays into both nostrils 2 (two) times daily. 12 mL 2   cetirizine (ZYRTEC) 10 MG tablet Take 1 tablet (  10 mg total) by mouth daily. 30 tablet 1   furosemide (LASIX) 40 MG tablet Take 40 mg on Monday, Wednesday and Friday. 48 tablet 3   hydrOXYzine (VISTARIL) 25 MG capsule Take 1 capsule (25 mg total) by mouth every 8 (eight) hours as needed.  30 capsule 0   naproxen (NAPROSYN) 500 MG tablet Take 1 tablet (500 mg total) by mouth 2 (two) times daily with a meal. 30 tablet 0   spironolactone (ALDACTONE) 25 MG tablet Take 1 tablet (25 mg total) by mouth daily. 90 tablet 0   cyanocobalamin (VITAMIN B12) 1000 MCG tablet Take 1 tablet (1,000 mcg total) by mouth daily. (Patient not taking: Reported on 08/31/2022)     folic acid (FOLVITE) 1 MG tablet Take 1 tablet (1 mg total) by mouth daily. (Patient not taking: Reported on 08/31/2022)     nystatin (MYCOSTATIN) 100000 UNIT/ML suspension Take 5 mLs (500,000 Units total) by mouth 4 (four) times daily. (Patient not taking: Reported on 11/10/2022) 60 mL 0   No facility-administered medications prior to visit.    Family History  Problem Relation Age of Onset   Kidney disease Mother    Cancer Father    Cerebral palsy Daughter     Social History   Socioeconomic History   Marital status: Divorced    Spouse name: Not on file   Number of children: 3   Years of education: Not on file   Highest education level: Associate degree: occupational, Scientist, product/process development, or vocational program  Occupational History   Occupation: [employer]    Comment: Full-Time.  Tobacco Use   Smoking status: Every Day    Packs/day: 0.25    Years: 30.00    Additional pack years: 0.00    Total pack years: 7.50    Types: Cigarettes    Start date: 10/26/1969   Smokeless tobacco: Never   Tobacco comments:    Smoking cessation  Vaping Use   Vaping Use: Never used  Substance and Sexual Activity   Alcohol use: Not Currently    Comment: socially   Drug use: Never   Sexual activity: Not Currently  Other Topics Concern   Not on file  Social History Narrative   Not on file   Social Determinants of Health   Financial Resource Strain: Low Risk  (12/04/2021)   Overall Financial Resource Strain (CARDIA)    Difficulty of Paying Living Expenses: Not very hard  Food Insecurity: No Food Insecurity (08/31/2022)   Hunger Vital Sign     Worried About Running Out of Food in the Last Year: Never true    Ran Out of Food in the Last Year: Never true  Transportation Needs: Unmet Transportation Needs (08/31/2022)   PRAPARE - Administrator, Civil Service (Medical): Yes    Lack of Transportation (Non-Medical): No  Physical Activity: Not on file  Stress: Not on file  Social Connections: Not on file  Intimate Partner Violence: Not At Risk (08/31/2022)   Humiliation, Afraid, Rape, and Kick questionnaire    Fear of Current or Ex-Partner: No    Emotionally Abused: No    Physically Abused: No    Sexually Abused: No  Objective:  Physical Exam: BP 136/86 (BP Location: Right Arm, Patient Position: Sitting, Cuff Size: Large)   Pulse 74   Temp 97.7 F (36.5 C) (Temporal)   Wt 281 lb 12.8 oz (127.8 kg)   SpO2 99%   BMI 49.52 kg/m     Physical Exam Constitutional:      General: She is not in acute distress.    Appearance: Normal appearance. She is not ill-appearing or toxic-appearing.  HENT:     Head: Normocephalic and atraumatic.     Nose: Nose normal. No congestion.  Eyes:     General: No scleral icterus.    Extraocular Movements: Extraocular movements intact.  Cardiovascular:     Rate and Rhythm: Normal rate and regular rhythm.     Pulses: Normal pulses.     Heart sounds: Normal heart sounds.  Pulmonary:     Effort: Pulmonary effort is normal. No respiratory distress.     Breath sounds: Normal breath sounds.  Abdominal:     General: Abdomen is flat. Bowel sounds are normal.     Palpations: Abdomen is soft.  Musculoskeletal:        General: Normal range of motion.  Lymphadenopathy:     Cervical: No cervical adenopathy.  Skin:    General: Skin is warm and dry.     Findings: No rash.  Neurological:     Mental Status: She is alert and oriented to person, place, and time. Mental status is at baseline.   Psychiatric:        Mood and Affect: Mood normal.        Behavior: Behavior normal.        Thought Content: Thought content normal.        Judgment: Judgment normal.     Lab review  Hemoglobin A1C level of 6.4% indicates prediabetes. The CBC with differential on 08/24/2022 and 08/31/2022 shows elevated WBC, neutrophils, and neutrophil absolute counts, indicating possible infection or inflammation. Folate level (10/20/2022) is slightly below normal, at 5.6 ng/mL. Sedimentation rate is elevated (66 mm/hr), highlighting inflammation. C-reactive protein is elevated at 4.3, a marker of inflammation. B12 level (379 pg/mL) is within the normal range. Urinalysis is mostly unremarkable, with no significant abnormalities detected. Electrolyte levels are generally within normal limits, except for slightly low sodium (134) on the CMP dated 08/31/2022. The thyroid panel including TSH is within normal limits. The comprehensive metabolic panel (CMP) results show normal kidney and liver function except for slightly low albumin (3.3) on both dates.  Overall, the laboratory findings suggest a potential inflammatory or infectious process, some hematologic changes, and prediabetic state.   MR Cervical Spine: 08/04/2022 Impression: Mild cervical spondylosis, most notable at C5-6 with mild left foraminal narrowing. The central canal and foramina are otherwise widely patent.  NVC w/ EMG Bilateral Upper Extremities  Impression: Left median neuropathy at or distal to the wrist (very mild), consistent with a clinical diagnosis of carpal tunnel syndrome.   There is no evidence of an ulnar neuropathy affecting either upper extremity. Needle electrode examination was limited by incomplete motor unit activation due to pain and therefore unable to assess for cervical radiculopathy.  Garner Nash, MD, MS

## 2022-11-11 LAB — HEPATITIS B CORE ANTIBODY, TOTAL: Hep B Core Total Ab: NONREACTIVE

## 2022-11-11 LAB — RHEUMASSURE

## 2022-11-11 LAB — CK: Total CK: 41 U/L (ref 7–177)

## 2022-11-12 LAB — HEPATITIS C ANTIBODY: Hepatitis C Ab: NONREACTIVE

## 2022-11-12 LAB — HEPATITIS B SURFACE ANTIGEN: Hepatitis B Surface Ag: NONREACTIVE

## 2022-11-12 LAB — HEPATITIS B SURFACE ANTIBODY,QUALITATIVE: Hep B S Ab: NONREACTIVE

## 2022-11-12 LAB — HLA-B27 ANTIGEN: HLA-B27 Antigen: NEGATIVE

## 2022-11-12 LAB — TIQ-NTM

## 2022-11-12 LAB — ALDOLASE: Aldolase: 4.4 U/L (ref <=8.1)

## 2022-11-14 DIAGNOSIS — R569 Unspecified convulsions: Secondary | ICD-10-CM | POA: Insufficient documentation

## 2022-11-14 DIAGNOSIS — R29898 Other symptoms and signs involving the musculoskeletal system: Secondary | ICD-10-CM | POA: Insufficient documentation

## 2022-11-14 HISTORY — DX: Other symptoms and signs involving the musculoskeletal system: R29.898

## 2022-11-14 HISTORY — DX: Unspecified convulsions: R56.9

## 2022-11-14 NOTE — Assessment & Plan Note (Signed)
Plan: Continue Spironolactone (ALDACTONE) 25 MG tablet daily. Continue Furosemide (LASIX) 40 MG tablet three times a week. Monitor electrolytes and renal function.

## 2022-11-14 NOTE — Assessment & Plan Note (Signed)
Differential Diagnosis:  Cervical or thoracic spinal pathology (cervical radiculopathy) Neurological disorders (Multiple sclerosis, ALS) Autoimmune diseases (Myasthenia gravis, Polymyositis) Peripheral neuropathy (diabetic neuropathy, idiopathic)  Plan: Ambulatory referral to Neurology for further evaluation. Order labs including Aldolase, CK , HLA-B27 antigen, ANA w/Reflex, and RheumAssure. Start Duloxetine (CYMBALTA) 20 MG capsule once daily.

## 2022-11-14 NOTE — Assessment & Plan Note (Signed)
Epilepsy Non-epileptic spasms (PNES) Metabolic disorders (electrolyte imbalance) Psychogenic nonepileptic seizures  Plan:  Ambulatory referral to Neurology for comprehensive seizure evaluation. Review previous neuroimaging and nerve conduction studies.

## 2022-11-15 LAB — ANA W/REFLEX: Anti Nuclear Antibody (ANA): NEGATIVE

## 2022-11-15 LAB — RHEUMASSURE: 14.3.3 ETA, Rheum. Arthritis: <0.2 ng/mL

## 2022-11-21 LAB — RHEUMASSURE: Rheumatoid Arthritis Factor: <14 Units/mL

## 2022-11-23 ENCOUNTER — Ambulatory Visit (INDEPENDENT_AMBULATORY_CARE_PROVIDER_SITE_OTHER): Payer: Medicaid Other | Admitting: Neurology

## 2022-11-23 ENCOUNTER — Encounter: Payer: Self-pay | Admitting: Neurology

## 2022-11-23 VITALS — BP 116/61 | HR 82 | Ht 63.5 in | Wt 279.0 lb

## 2022-11-23 DIAGNOSIS — G514 Facial myokymia: Secondary | ICD-10-CM

## 2022-11-23 DIAGNOSIS — R29898 Other symptoms and signs involving the musculoskeletal system: Secondary | ICD-10-CM | POA: Diagnosis not present

## 2022-11-23 DIAGNOSIS — M79604 Pain in right leg: Secondary | ICD-10-CM

## 2022-11-23 DIAGNOSIS — R269 Unspecified abnormalities of gait and mobility: Secondary | ICD-10-CM

## 2022-11-23 DIAGNOSIS — M79605 Pain in left leg: Secondary | ICD-10-CM

## 2022-11-23 NOTE — Patient Instructions (Signed)
MRI brain and MRI lumbar spine  Routine EEG

## 2022-11-23 NOTE — Progress Notes (Signed)
[facility] Neurology Division Clinic Note - Initial Visit   Date: 11/23/2022   Theresa Marquez MRN: [id number] DOB: May 31, 1966   Dear Dr. Janee Morn:  Thank you for your kind referral of Theresa Marquez for consultation of numbness/tingling. Although her history is well known to you, please allow [place] to reiterate it for the purpose of our medical record. The patient was accompanied to the clinic by self.   Theresa Marquez is a 57 y.o. right-handed female with hypertension presenting for evaluation of numbness/tingling.   IMPRESSION: Bilateral leg numbness, no evidence of neuropathy on exam Gait instability with low back pain and leg weakness (subjective) Facial twitching  With exam being normal, the likelihood of primary neurological condition is low. To be complete, she will undergo evaluation as noted below.   PLAN: - MRI brain wo contrast - MRI lumbar spine wo contrast - Routine EEG, very unlikely that her falls or movements are seizures  Further recommendations pending results.   ------------------------------------------------------------- History of present illness: In July she was working for [employer] but reports having low back pain after lifting something, so stopped working.  Over the past year, she has developed tingling from the waist down and in her left arm.  Symptoms are constant without exacerbating factors.  She reports having numbness in the legs.  She tried PT with no benefit and has been evaluated by orthopaedics. She had MRI cervical spine which shows mild cervical spondylosis, no significant stenosis. EMG showed very mild left carpal tunnel syndrome, no stenosis. She could not tolerate EMG portion.    She also reports having 5 spells of falls, which tends to occur upon standing.  She feels that her legs give out.  She was able to stand up immediately without confusion. No loss of awareness.  She has generalized pain and difficulty moving the left leg.     Sometimes, she also has pulling sensation on the face and abnormal movements.    She lives with daughter.  She smokes 0.25 PPD.   Out-side paper records, electronic medical record, and images have been reviewed where available and summarized as:  MRI cervical spine wo contrast 08/04/2022: Mild cervical spondylosis most notable at C5-6 where there is mild left foraminal narrowing. The central canal and foramina are otherwise widely patent. ' NCS/EMG of the upper extremities 06/29/2022: Left median neuropathy at or distal to the wrist (very mild), consistent with a clinical diagnosis of carpal tunnel syndrome.   There is no evidence of an ulnar neuropathy affecting either upper extremity. Needle electrode examination was limited by incomplete motor unit activation due to pain and therefore unable to assess for cervical radiculopathy.      Lab Results  Component Value Date   HGBA1C 6.4 08/24/2022   Lab Results  Component Value Date   VITAMINB12 379 10/20/2022   Lab Results  Component Value Date   TSH 2.41 08/24/2022   Lab Results  Component Value Date   ESRSEDRATE 66 (H) 10/20/2022    Past Medical History:  Diagnosis Date   Allergy    Endometrial polyp 08/09/2017   Formatting of this note might be different from the original. S/p hysteroscopy with [facility] 10/2017 with Dr Elmore Guise, was benign    Past Surgical History:  Procedure Laterality Date   DG GALL BLADDER Right      Medications:  Outpatient Encounter Medications as of 11/23/2022  Medication Sig   albuterol (VENTOLIN HFA) 108 (90 Base) MCG/ACT inhaler Inhale 2 puffs into the  lungs every 6 (six) hours as needed for wheezing or shortness of breath.   cetirizine (ZYRTEC) 10 MG tablet Take 1 tablet (10 mg total) by mouth daily.   DULoxetine (CYMBALTA) 20 MG capsule Take 1 capsule (20 mg total) by mouth daily for 14 days, THEN 2 capsules (40 mg total) daily for 16 days. (Patient taking differently: Take 2 capsules (40 mg  total) daily )   furosemide (LASIX) 40 MG tablet Take 40 mg on Monday, Wednesday and Friday.   hydrOXYzine (VISTARIL) 25 MG capsule Take 1 capsule (25 mg total) by mouth every 8 (eight) hours as needed.   ipratropium (ATROVENT) 0.06 % nasal spray Place 2 sprays into both nostrils 2 (two) times daily.   naproxen (NAPROSYN) 500 MG tablet Take 1 tablet (500 mg total) by mouth 2 (two) times daily with a meal.   spironolactone (ALDACTONE) 25 MG tablet Take 1 tablet (25 mg total) by mouth daily.   cyanocobalamin (VITAMIN B12) 1000 MCG tablet Take 1 tablet (1,000 mcg total) by mouth daily. (Patient not taking: Reported on 08/31/2022)   folic acid (FOLVITE) 1 MG tablet Take 1 tablet (1 mg total) by mouth daily. (Patient not taking: Reported on 08/31/2022)   nystatin (MYCOSTATIN) 100000 UNIT/ML suspension Take 5 mLs (500,000 Units total) by mouth 4 (four) times daily. (Patient not taking: Reported on 11/10/2022)   No facility-administered encounter medications on file as of 11/23/2022.    Allergies:  Allergies  Allergen Reactions   Codeine Other (See Comments)    Hallucinations  Hallucinates    Prednisone Swelling    Family History: Family History  Problem Relation Age of Onset   Kidney disease Mother    Cancer Father    Cerebral palsy Daughter     Social History: Social History   Tobacco Use   Smoking status: Every Day    Packs/day: 0.25    Years: 30.00    Additional pack years: 0.00    Total pack years: 7.50    Types: Cigarettes    Start date: 10/26/1969   Smokeless tobacco: Never   Tobacco comments:    Smoking cessation  Vaping Use   Vaping Use: Never used  Substance Use Topics   Alcohol use: Not Currently    Comment: socially   Drug use: Never   Social History   Social History Narrative   Right Handed    Lives in a one story home with a basement    Vital Signs:  BP 116/61   Pulse 82   Ht 5' 3.5" (1.613 m)   Wt 279 lb (126.6 kg)   SpO2 98%   BMI 48.65 kg/m     Neurological Exam: MENTAL STATUS including orientation to time, place, person, recent and remote memory, attention span and concentration, language, and fund of knowledge is normal.  Speech is not dysarthric.  CRANIAL NERVES: II:  No visual field defects.     III-IV-VI: Pupils equal round and reactive to light.  Normal conjugate, extra-ocular eye movements in all directions of gaze.  No nystagmus.  No ptosis.   V:  Normal facial sensation.    VII:  Normal facial symmetry and movements.   VIII:  Normal hearing and vestibular function.   IX-X:  Normal palatal movement.   XI:  Normal shoulder shrug and head rotation.   XII:  Normal tongue strength and range of motion, no deviation or fasciculation.  MOTOR:  No atrophy, fasciculations or abnormal movements.  No pronator drift.  Upper Extremity:  Right  Left  Deltoid  5/5   5/5   Biceps  5/5   5/5   Triceps  5/5   5/5   Wrist extensors  5/5   5/5   Wrist flexors  5/5   5/5   Finger extensors  5/5   5/5   Finger flexors  5/5   5/5   Dorsal interossei  5/5   5/5   Abductor pollicis  5/5   5/5   Tone (Ashworth scale)  0  0   Lower Extremity:  Right  Left  Hip flexors  5/5   5/5   Knee flexors  5/5   5/5   Knee extensors  5/5   5/5   Dorsiflexors  5/5   5/5   Plantarflexors  5/5   5/5   Toe extensors  5/5   5/5   Toe flexors  5/5   5/5   Tone (Ashworth scale)  0  0   MSRs:                                           Right        Left brachioradialis 2+  2+  biceps 2+  2+  triceps 2+  2+  patellar 2+  2+  ankle jerk 2+  2+  Hoffman no  no  plantar response down  down   SENSORY:  Normal and symmetric perception of light touch, pinprick, vibration, and temperature.     COORDINATION/GAIT: Normal finger-to- nose-finger.  Intact rapid alternating movements bilaterally.  Gait appears antalgic, stiff, stable.    Thank you for allowing me to participate in patient's care.  If I can answer any additional questions, I would be  pleased to do so.    Sincerely,    Ernestine Langworthy K. Allena Katz, DO

## 2022-11-30 ENCOUNTER — Other Ambulatory Visit: Payer: Commercial Managed Care - HMO

## 2022-12-01 ENCOUNTER — Telehealth: Payer: Self-pay

## 2022-12-01 NOTE — Telephone Encounter (Signed)
Called [facility] Plan [phone]. Was able to initiate CPT code 98119. Case ID#: [id number]. Need to send in clinicals in.   Unable to get a case ID on CPT [phone] as systems are down. Was asked to call back when systems are back up.   Location that insurance recommended to MRI is  [facility] [address] [place], [place] [address] [phone]

## 2022-12-01 NOTE — Telephone Encounter (Signed)
Clinicals for CPT [phone] Case#:[id number] has been faxed to [phone]

## 2022-12-02 NOTE — Telephone Encounter (Signed)
Called patients insurance and spoke to a representative who checked on CPT 72148 and stated they have received the additional notes that was faxed over and stated it is in review. Checked to see if CPT [phone] had any update and was informed that there was not any case open for that CPT. Tried to initiate PA and they stated the system is still down and to call back.

## 2022-12-08 ENCOUNTER — Telehealth (INDEPENDENT_AMBULATORY_CARE_PROVIDER_SITE_OTHER): Payer: Medicaid Other | Admitting: Family Medicine

## 2022-12-08 ENCOUNTER — Ambulatory Visit (HOSPITAL_BASED_OUTPATIENT_CLINIC_OR_DEPARTMENT_OTHER): Payer: Medicaid Other

## 2022-12-08 DIAGNOSIS — M792 Neuralgia and neuritis, unspecified: Secondary | ICD-10-CM

## 2022-12-08 DIAGNOSIS — R21 Rash and other nonspecific skin eruption: Secondary | ICD-10-CM | POA: Diagnosis not present

## 2022-12-08 DIAGNOSIS — R5383 Other fatigue: Secondary | ICD-10-CM

## 2022-12-08 MED ORDER — DULOXETINE HCL 20 MG PO CPEP
20.0000 mg | ORAL_CAPSULE | Freq: Every day | ORAL | 0 refills | Status: DC
Start: 2022-12-08 — End: 2023-03-01

## 2022-12-08 MED ORDER — LIDOCAINE-PRILOCAINE 2.5-2.5 % EX CREA
1.0000 | TOPICAL_CREAM | CUTANEOUS | 0 refills | Status: DC | PRN
Start: 2022-12-08 — End: 2023-08-15

## 2022-12-08 MED ORDER — ZIKS ARTHRITIS PAIN RELIEF 0.025-1-12 % EX CREA
1.0000 | TOPICAL_CREAM | Freq: Two times a day (BID) | CUTANEOUS | Status: DC | PRN
Start: 2022-12-08 — End: 2023-08-15

## 2022-12-08 NOTE — Telephone Encounter (Signed)
Called patients insurance and CPT code 16109 has been approved from dates 12/08/22-01/22/23. Authorization numnber: [id number]. [facility] location.  PA for [facility]#[id number]. Case ID: [id number] is still pending review.

## 2022-12-08 NOTE — Progress Notes (Signed)
Virtual Visit via Video Note  I connected with Theresa Marquez on 12/08/2022 at  3:00 PM EDT by a video enabled telemedicine application and verified that I am speaking with the correct person using two identifiers.  Location: Patient: Home Provider: Office   I discussed the limitations of evaluation and management by telemedicine and the availability of in person appointments. The patient expressed understanding and agreed to proceed.  History of Present Illness:  Chief Complaint  Patient presents with   Medical Management of Chronic Issues    Neuropathic pain follow-up. Pt stated that she hasn't noticed any changes. Pt has been fatigued since visit.   Chief Complaint: Persistent pain, increased fatigue, and hand skin peeling.  History of Present Illness: Pain. Patient reports persistent pain with no significant change since the last visit. The patient continues to experience fatigue. Cymbalta  was being titrated from 20 mg to 40 mg, but the patient had to reduce the dosage back to 20 mg due to fatigue. Hydroxyzine is also being taken. No improvement in pain has been noted with Cymbalta.  Skin Peeling. The patient reports new onset of skin peeling on the palms of the hands.   Patient was also recently seen by neurology with recommendation for MRI of the brain and lumbar and EEG to evaluate for potential seizure activity and intermittent weakness.   Recent laboratory results showed a raised erythrocyte sedimentation rate (ESR), but other tests for rheumatoid arthritis, lupus, and other inflammatory markers were unremarkable.   Review of Systems  Constitutional:  Positive for malaise/fatigue.  Musculoskeletal:  Positive for joint pain.  Skin:  Positive for rash.  Neurological:  Positive for weakness.       "Twitching spells"    Observations/Objective: There were no vitals filed for this visit.   Gen: NAD, resting comfortably HEENT: EOMI Pulm: NWOB Skin: no rash on  face Neuro: no facial asymmetry or dysmetria Psych: Normal affect   Assessment and Plan: Problem List Items Addressed This Visit       Musculoskeletal and Integument   Rash of both hands - Primary    New onset skin peeling on palms, unlikely related to Cymbalta.  Differential Diagnosis:  Contact Dermatitis Eczema Psoriasis  Plan:  Observe and monitor skin condition. Consider referral to dermatology if condition persists or worsens.        Other   Fatigue    Patient reports increased fatigue potentially related to medications including Cymbalta and hydroxyzine.  Could be related to underlying medical condition given other symptoms and elevated ESR.  Plan:  Continue monitoring fatigue levels while adjusting medications. Avoid hydroxyzine if possible to assess the impact on fatigue. Reassess medications if fatigue persists.      Neuralgia    Patient reports persistent pain with only slight relief from Cymbalta.  Plan:  Continue Cymbalta 20 mg at night. Try topical lidocaine or capsaicin for pain relief. Avoid using hydroxyzine. Monitor response to topical therapies. Consider consultation with pain management for additional strategies.      Relevant Medications   DULoxetine (CYMBALTA) 20 MG capsule   Capsaicin-Menthol-Methyl Sal (CAPSAICIN-METHYL SAL-MENTHOL) 0.025-1-12 % CREA   lidocaine-prilocaine (EMLA) cream    Medications Discontinued During This Encounter  Medication Reason   DULoxetine (CYMBALTA) 20 MG capsule Reorder     Follow Up Instructions: No follow-ups on file.    I discussed the assessment and treatment plan with the patient. The patient was provided an opportunity to ask questions and all were answered. The  patient agreed with the plan and demonstrated an understanding of the instructions.   The patient was advised to call back or seek an in-person evaluation if the symptoms worsen or if the condition fails to improve as  anticipated. Garnette Gunner, MD

## 2022-12-09 ENCOUNTER — Ambulatory Visit: Payer: Medicaid Other | Admitting: Neurology

## 2022-12-09 DIAGNOSIS — R29898 Other symptoms and signs involving the musculoskeletal system: Secondary | ICD-10-CM | POA: Diagnosis not present

## 2022-12-09 DIAGNOSIS — G514 Facial myokymia: Secondary | ICD-10-CM

## 2022-12-09 DIAGNOSIS — R269 Unspecified abnormalities of gait and mobility: Secondary | ICD-10-CM | POA: Diagnosis not present

## 2022-12-09 DIAGNOSIS — M79604 Pain in right leg: Secondary | ICD-10-CM

## 2022-12-09 DIAGNOSIS — M79605 Pain in left leg: Secondary | ICD-10-CM | POA: Diagnosis not present

## 2022-12-09 NOTE — Progress Notes (Signed)
EEG complete - results pending 

## 2022-12-10 DIAGNOSIS — R21 Rash and other nonspecific skin eruption: Secondary | ICD-10-CM | POA: Insufficient documentation

## 2022-12-10 DIAGNOSIS — M792 Neuralgia and neuritis, unspecified: Secondary | ICD-10-CM

## 2022-12-10 HISTORY — DX: Neuralgia and neuritis, unspecified: M79.2

## 2022-12-10 HISTORY — DX: Rash and other nonspecific skin eruption: R21

## 2022-12-10 NOTE — Assessment & Plan Note (Signed)
New onset skin peeling on palms, unlikely related to Cymbalta.  Differential Diagnosis:  Contact Dermatitis Eczema Psoriasis  Plan:  Observe and monitor skin condition. Consider referral to dermatology if condition persists or worsens.

## 2022-12-10 NOTE — Assessment & Plan Note (Signed)
Patient reports persistent pain with only slight relief from Cymbalta.  Plan:  Continue Cymbalta 20 mg at night. Try topical lidocaine or capsaicin for pain relief. Avoid using hydroxyzine. Monitor response to topical therapies. Consider consultation with pain management for additional strategies.

## 2022-12-10 NOTE — Assessment & Plan Note (Signed)
Patient reports increased fatigue potentially related to medications including Cymbalta and hydroxyzine.  Could be related to underlying medical condition given other symptoms and elevated ESR.  Plan:  Continue monitoring fatigue levels while adjusting medications. Avoid hydroxyzine if possible to assess the impact on fatigue. Reassess medications if fatigue persists.

## 2022-12-20 ENCOUNTER — Telehealth (HOSPITAL_BASED_OUTPATIENT_CLINIC_OR_DEPARTMENT_OTHER): Payer: Self-pay

## 2022-12-22 ENCOUNTER — Ambulatory Visit (HOSPITAL_BASED_OUTPATIENT_CLINIC_OR_DEPARTMENT_OTHER): Payer: Medicaid Other

## 2022-12-27 NOTE — Procedures (Signed)
ELECTROENCEPHALOGRAM REPORT  Date of Study: 12/09/2022  Patient's Name: Theresa Marquez MRN: [id number] Date of Birth: [birth year]  Referring Provider: Dr. Nita Sickle  Clinical History: This is a 57 year old woman  with facial twitching, falls. EEG for classification.  Medications: Cymbalta, Lasix, Aldactone, Vistaril  Technical Summary: A multichannel digital EEG recording measured by the international 10-20 system with electrodes applied with paste and impedances below 5000 ohms performed in our laboratory with EKG monitoring in an awake and drowsy patient.  Hyperventilation and photic stimulation were performed.  The digital EEG was referentially recorded, reformatted, and digitally filtered in a variety of bipolar and referential montages for optimal display.    Description: The patient is awake and drowsy during the recording.  During maximal wakefulness, there is a symmetric, medium voltage 10 Hz posterior dominant rhythm that attenuates with eye opening.  The record is symmetric.  During drowsiness, there is an increase in theta slowing of the background. Sleep was not captured. Hyperventilation and photic stimulation did not elicit any abnormalities.  There were no epileptiform discharges or electrographic seizures seen.    EKG lead was unremarkable.  Impression: This awake and drowsy EEG is normal.    Clinical Correlation: A normal EEG does not exclude a clinical diagnosis of epilepsy.  If further clinical questions remain, prolonged EEG may be helpful.  Clinical correlation is advised.   Patrcia Dolly, M.D.

## 2022-12-29 ENCOUNTER — Telehealth: Payer: Self-pay | Admitting: Neurology

## 2022-12-29 NOTE — Telephone Encounter (Signed)
Please let pt know that MRI brain was approved, however insurance did not approve MRI lumbar spine.  If she has not completed PT, then I recommend that she start this for leg strengthening.  Insurance will typically not approve MRI lumbar spine, if she has not completed PT first.

## 2022-12-29 NOTE — Telephone Encounter (Signed)
Called and spoke to patient and informed her that her MRI brain was approved, however insurance did not approve MRI lumbar spine. If she has not completed PT, then Dr. Allena Katz recommends that she start this for leg strengthening. Insurance will typically not approve MRI lumbar spine, if she has not completed PT first.   Patient stated that she went to a few sessions and has done the rest of the PT at her home. PT did not help.   Informed patient I will let Dr. Allena Katz know about her PT and get back to her.

## 2022-12-31 NOTE — Telephone Encounter (Signed)
Called and left a detailed message per DPR that per Dr. Allena Katz we will wait to see the results of the MRI Brain and then attempt the lumbar spine Prior Auth once results are read by Dr. Allena Katz. Left contact information incase patient has any questions or concerns.

## 2023-01-05 ENCOUNTER — Ambulatory Visit (HOSPITAL_BASED_OUTPATIENT_CLINIC_OR_DEPARTMENT_OTHER)
Admission: RE | Admit: 2023-01-05 | Discharge: 2023-01-05 | Disposition: A | Discharge status: Home / Self Care | Payer: Medicaid Other | Source: Ambulatory Visit | Attending: Neurology | Admitting: Neurology

## 2023-01-05 DIAGNOSIS — G514 Facial myokymia: Secondary | ICD-10-CM | POA: Insufficient documentation

## 2023-01-05 DIAGNOSIS — M79604 Pain in right leg: Secondary | ICD-10-CM | POA: Diagnosis present

## 2023-01-05 DIAGNOSIS — R29898 Other symptoms and signs involving the musculoskeletal system: Secondary | ICD-10-CM | POA: Insufficient documentation

## 2023-01-05 DIAGNOSIS — M79605 Pain in left leg: Secondary | ICD-10-CM | POA: Insufficient documentation

## 2023-01-05 DIAGNOSIS — R269 Unspecified abnormalities of gait and mobility: Secondary | ICD-10-CM | POA: Diagnosis not present

## 2023-01-05 MED ORDER — GADOBUTROL 1 MMOL/ML IV SOLN
10.0000 mL | Freq: Once | INTRAVENOUS | Status: AC | PRN
  Administered 2023-01-05: 10 mL via INTRAVENOUS

## 2023-01-06 ENCOUNTER — Other Ambulatory Visit: Payer: Self-pay

## 2023-01-06 DIAGNOSIS — R29898 Other symptoms and signs involving the musculoskeletal system: Secondary | ICD-10-CM

## 2023-01-06 DIAGNOSIS — M79604 Pain in right leg: Secondary | ICD-10-CM

## 2023-01-06 DIAGNOSIS — R269 Unspecified abnormalities of gait and mobility: Secondary | ICD-10-CM

## 2023-01-13 ENCOUNTER — Ambulatory Visit (HOSPITAL_BASED_OUTPATIENT_CLINIC_OR_DEPARTMENT_OTHER)
Admission: RE | Admit: 2023-01-13 | Discharge: 2023-01-13 | Disposition: A | Discharge status: Home / Self Care | Payer: Medicaid Other | Source: Ambulatory Visit | Attending: Neurology | Admitting: Neurology

## 2023-01-13 DIAGNOSIS — M5136 Other intervertebral disc degeneration, lumbar region: Secondary | ICD-10-CM | POA: Diagnosis not present

## 2023-01-13 DIAGNOSIS — R29898 Other symptoms and signs involving the musculoskeletal system: Secondary | ICD-10-CM

## 2023-01-13 DIAGNOSIS — M79605 Pain in left leg: Secondary | ICD-10-CM | POA: Insufficient documentation

## 2023-01-13 DIAGNOSIS — M48061 Spinal stenosis, lumbar region without neurogenic claudication: Secondary | ICD-10-CM | POA: Diagnosis not present

## 2023-01-13 DIAGNOSIS — R269 Unspecified abnormalities of gait and mobility: Secondary | ICD-10-CM

## 2023-01-13 DIAGNOSIS — M549 Dorsalgia, unspecified: Secondary | ICD-10-CM | POA: Diagnosis not present

## 2023-01-13 DIAGNOSIS — M79604 Pain in right leg: Secondary | ICD-10-CM

## 2023-02-15 DIAGNOSIS — R42 Dizziness and giddiness: Secondary | ICD-10-CM | POA: Diagnosis not present

## 2023-02-15 DIAGNOSIS — S0993XA Unspecified injury of face, initial encounter: Secondary | ICD-10-CM | POA: Diagnosis not present

## 2023-02-15 DIAGNOSIS — R531 Weakness: Secondary | ICD-10-CM | POA: Diagnosis not present

## 2023-02-15 DIAGNOSIS — I10 Essential (primary) hypertension: Secondary | ICD-10-CM | POA: Diagnosis not present

## 2023-02-15 DIAGNOSIS — S0990XA Unspecified injury of head, initial encounter: Secondary | ICD-10-CM | POA: Diagnosis not present

## 2023-02-15 DIAGNOSIS — W19XXXA Unspecified fall, initial encounter: Secondary | ICD-10-CM | POA: Diagnosis not present

## 2023-02-15 DIAGNOSIS — S0083XA Contusion of other part of head, initial encounter: Secondary | ICD-10-CM | POA: Diagnosis not present

## 2023-02-15 DIAGNOSIS — S199XXA Unspecified injury of neck, initial encounter: Secondary | ICD-10-CM | POA: Diagnosis not present

## 2023-02-22 ENCOUNTER — Telehealth: Payer: Self-pay | Admitting: Family Medicine

## 2023-02-22 NOTE — Telephone Encounter (Signed)
[facility].

## 2023-02-22 NOTE — Telephone Encounter (Signed)
Pt scheduled an OV for a hosp/up on 03/01/23 for a Fall. I will change. She was seen at [facility] ED. She will need a toc call, pt at [phone].

## 2023-03-01 ENCOUNTER — Encounter: Payer: Self-pay | Admitting: Family Medicine

## 2023-03-01 ENCOUNTER — Ambulatory Visit (INDEPENDENT_AMBULATORY_CARE_PROVIDER_SITE_OTHER): Payer: Medicaid Other | Admitting: Family Medicine

## 2023-03-01 VITALS — BP 122/78 | HR 90 | Temp 98.6°F | Wt 282.6 lb

## 2023-03-01 DIAGNOSIS — R55 Syncope and collapse: Secondary | ICD-10-CM

## 2023-03-01 DIAGNOSIS — R296 Repeated falls: Secondary | ICD-10-CM

## 2023-03-01 DIAGNOSIS — M48061 Spinal stenosis, lumbar region without neurogenic claudication: Secondary | ICD-10-CM

## 2023-03-01 DIAGNOSIS — I739 Peripheral vascular disease, unspecified: Secondary | ICD-10-CM

## 2023-03-01 DIAGNOSIS — S025XXA Fracture of tooth (traumatic), initial encounter for closed fracture: Secondary | ICD-10-CM

## 2023-03-01 DIAGNOSIS — Z1211 Encounter for screening for malignant neoplasm of colon: Secondary | ICD-10-CM

## 2023-03-01 DIAGNOSIS — M792 Neuralgia and neuritis, unspecified: Secondary | ICD-10-CM | POA: Diagnosis not present

## 2023-03-01 HISTORY — DX: Syncope and collapse: R55

## 2023-03-01 HISTORY — DX: Repeated falls: R29.6

## 2023-03-01 HISTORY — DX: Fracture of tooth (traumatic), initial encounter for closed fracture: S02.5XXA

## 2023-03-01 HISTORY — DX: Spinal stenosis, lumbar region without neurogenic claudication: M48.061

## 2023-03-01 HISTORY — DX: Peripheral vascular disease, unspecified: I73.9

## 2023-03-01 MED ORDER — GABAPENTIN 100 MG PO CAPS
100.0000 mg | ORAL_CAPSULE | Freq: Three times a day (TID) | ORAL | 2 refills | Status: DC | PRN
Start: 2023-03-01 — End: 2023-07-14

## 2023-03-01 MED ORDER — ROSUVASTATIN CALCIUM 5 MG PO TABS
5.0000 mg | ORAL_TABLET | Freq: Every day | ORAL | 3 refills | Status: DC
Start: 2023-03-01 — End: 2023-06-07

## 2023-03-01 NOTE — Progress Notes (Signed)
Assessment/Plan:  Total time spent caring for the patient today was 46 minutes. This includes time spent before the visit reviewing the chart, time spent during the visit, and time spent after the visit on documentation, etc.  Problem List Items Addressed This Visit       Cardiovascular and Mediastinum   Small vessel disease (HCC)    Noted small vessel ischemia on MRI without current symptoms. Plan: Start Rosuvastatin 5 mg daily to manage vascular risk. Monitor lipid levels and reassess as needed.      Relevant Medications   rosuvastatin (CRESTOR) 5 MG tablet     Digestive   Closed fracture of tooth   Relevant Orders   Ambulatory referral to Dentistry     Musculoskeletal and Integument   Lumbar foraminal stenosis    Confirmed severe lumbar spine degenerative changes causing pain and numbness.  Plan: Continue physical therapy exercises as tolerated. Reassess MRI findings and refer to neurosurgery. Naproxen PRN for pain, and gabapentin 100 mg 3 times daily as needed      Relevant Orders   Ambulatory referral to Neurosurgery   MR LUMBAR SPINE WO CONTRAST     Other   Neuralgia   Relevant Medications   gabapentin (NEURONTIN) 100 MG capsule   Syncope - Primary   Frequent falls    Patient experiencing  "drop attacks" with no preceding warning. Potential causes include spinal nerve compression due to lumbar disease. Cannot rule out cardiac causes leading to syncope but prior cardiac workup reassuring.  Neurologic work up also reassuring ruling out structural brain disease and seizure.   Plan: Proceed with MRI of the lumbar spine. Consult neurosurgery for evaluation and management. Encourage the continuation of cardiac follow-up.      Other Visit Diagnoses     Screening for colon cancer       Relevant Orders   Ambulatory referral to Gastroenterology       Medications Discontinued During This Encounter  Medication Reason   cetirizine (ZYRTEC) 10 MG tablet     nystatin (MYCOSTATIN) 100000 UNIT/ML suspension    DULoxetine (CYMBALTA) 20 MG capsule    cyanocobalamin (VITAMIN B12) 1000 MCG tablet     Return in about 3 months (around 05/31/2023) for physical (fasting labs).    Subjective:   Encounter date: 03/01/2023  Theresa Marquez is a 57 y.o. female who has Family history of thyroid disease; Fatigue; Neck swelling; Obesity, morbid, BMI 40.0-49.9 (HCC); Tobacco abuse; Bilateral lower extremity edema; Recurrent sinus infections; Rhinitis; OSA (obstructive sleep apnea); AKI (acute kidney injury) (HCC); Wheezing; Chronic midline low back pain with left-sided sciatica; Allergy; Cervicalgia; Sleep disturbance; Pruritic dermatitis; Weakness of extremity; Seizure-like activity (HCC); Neuralgia; Rash of both hands; Syncope; Frequent falls; Lumbar foraminal stenosis; Small vessel disease (HCC); and Closed fracture of tooth on their problem list..   She  has a past medical history of Allergy and Endometrial polyp (08/09/2017)..   Chief Complaint: Frequent falls and drop attacks with subsequent head injury.  History of Present Illness (HPI): Patient is a 57 y.o. female with a complex medical history, experiencing a significant history of frequent falls and "drop attacks." She reports no prior recollection or memory before a recent fall where she sat down on the toilet and subsequently had a syncopal episode, hitting her head on the bathtub rail, leading to significant bleeding from the nose and ear. This prompted an emergency evaluation where imaging suggested severe bruising without a confirmed head or neck fracture.  Mild dental dislocation  noted  The patient continues to experience the following symptoms:  Neck pain and swelling under the left jaw. Forehead, nose, and left ear pain extending to the jaw. Episodes of weakness and lower extremity numbness, particularly on waking. Persistent falls with two more such incidents since the initial  fall. Challenges in memory recall around the period of the fall.  Other Concerns:  Cervical and Lumbar Disease: Severe bilateral L5 neuroforaminal stenosis with bulging disc and endplate spurring. Known degenerative cervical disease.   Neurological Symptoms: Intermittent tremors and weakness prior to falls. Previous EEG studies were negative for seizure activity. Vascular Concerns: Noted small vessel ischemia on brain MRI without current symptoms.  Patient with prior cardiac workup approximately 1 year ago with stable echocardiogram.  She follows with cardiology.  Recent EKG at emergency department was normal sinus rhythm without other abnormalities.   Review of Systems  Constitutional:  Positive for malaise/fatigue.  HENT:  Positive for ear pain and hearing loss.   Eyes:  Negative for blurred vision.  Respiratory:  Negative for shortness of breath.   Cardiovascular:  Negative for chest pain, palpitations, orthopnea, claudication, leg swelling and PND.  Musculoskeletal:  Positive for back pain, falls and neck pain.  Skin:  Positive for itching and rash.  Neurological:  Positive for dizziness, tingling, tremors, focal weakness, loss of consciousness, weakness and headaches. Negative for speech change and seizures.  Psychiatric/Behavioral:  Positive for memory loss.   All other systems reviewed and are negative.   Past Surgical History:  Procedure Laterality Date   DG GALL BLADDER Right     Outpatient Medications Prior to Visit  Medication Sig Dispense Refill   albuterol (VENTOLIN HFA) 108 (90 Base) MCG/ACT inhaler Inhale 2 puffs into the lungs every 6 (six) hours as needed for wheezing or shortness of breath. 8 g 0   Capsaicin-Menthol-Methyl Sal (CAPSAICIN-METHYL SAL-MENTHOL) 0.025-1-12 % CREA Apply 1 Application topically 2 (two) times daily as needed (pain).     folic acid (FOLVITE) 1 MG tablet Take 1 tablet (1 mg total) by mouth daily.     furosemide (LASIX) 40 MG tablet Take 40  mg on Monday, Wednesday and Friday. 48 tablet 3   hydrOXYzine (VISTARIL) 25 MG capsule Take 1 capsule (25 mg total) by mouth every 8 (eight) hours as needed. 90 capsule 0   lidocaine-prilocaine (EMLA) cream Apply 1 Application topically as needed. 30 g 0   naproxen (NAPROSYN) 500 MG tablet Take 500 mg by mouth 2 (two) times daily.     spironolactone (ALDACTONE) 25 MG tablet Take 1 tablet (25 mg total) by mouth daily. 90 tablet 3   ipratropium (ATROVENT) 0.06 % nasal spray Place 2 sprays into both nostrils 2 (two) times daily. 12 mL 2   cetirizine (ZYRTEC) 10 MG tablet Take 1 tablet (10 mg total) by mouth daily. (Patient not taking: Reported on 03/01/2023) 90 tablet 3   cyanocobalamin (VITAMIN B12) 1000 MCG tablet Take 1 tablet (1,000 mcg total) by mouth daily. (Patient not taking: Reported on 08/31/2022)     DULoxetine (CYMBALTA) 20 MG capsule Take 1 capsule (20 mg total) by mouth daily. (Patient not taking: Reported on 03/01/2023) 90 capsule 0   nystatin (MYCOSTATIN) 100000 UNIT/ML suspension Take 5 mLs (500,000 Units total) by mouth 4 (four) times daily. (Patient not taking: Reported on 11/10/2022) 60 mL 0   No facility-administered medications prior to visit.    Family History  Problem Relation Age of Onset   Kidney disease Mother  Cancer Father    Cerebral palsy Daughter     Social History   Socioeconomic History   Marital status: Divorced    Spouse name: Not on file   Number of children: 3   Years of education: Not on file   Highest education level: Associate degree: occupational, Scientist, product/process development, or vocational program  Occupational History   Occupation: [employer]    Comment: Full-Time.  Tobacco Use   Smoking status: Every Day    Current packs/day: 0.25    Average packs/day: 0.3 packs/day for 53.3 years (13.3 ttl pk-yrs)    Types: Cigarettes    Start date: 10/26/1969   Smokeless tobacco: Never   Tobacco comments:    Smoking cessation  Vaping Use   Vaping status: Never Used  Substance  and Sexual Activity   Alcohol use: Not Currently    Comment: socially   Drug use: Never   Sexual activity: Not Currently  Other Topics Concern   Not on file  Social History Narrative   Right Handed    Lives in a one story home with a basement   Social Determinants of Health   Financial Resource Strain: Low Risk  (12/04/2021)   Overall Financial Resource Strain (CARDIA)    Difficulty of Paying Living Expenses: Not very hard  Food Insecurity: No Food Insecurity (08/31/2022)   Hunger Vital Sign    Worried About Running Out of Food in the Last Year: Never true    Ran Out of Food in the Last Year: Never true  Transportation Needs: Unmet Transportation Needs (08/31/2022)   PRAPARE - Administrator, Civil Service (Medical): Yes    Lack of Transportation (Non-Medical): No  Physical Activity: Not on file  Stress: Not on file  Social Connections: Not on file  Intimate Partner Violence: Not At Risk (08/31/2022)   Humiliation, Afraid, Rape, and Kick questionnaire    Fear of Current or Ex-Partner: No    Emotionally Abused: No    Physically Abused: No    Sexually Abused: No                                                                                                  Objective:  Physical Exam: BP 122/78 (BP Location: Left Arm, Patient Position: Sitting, Cuff Size: Large)   Pulse 90   Temp 98.6 F (37 C) (Temporal)   Wt 282 lb 9.6 oz (128.2 kg)   SpO2 94%   BMI 49.28 kg/m     Physical Exam Constitutional:      General: She is not in acute distress.    Appearance: Normal appearance. She is not ill-appearing or toxic-appearing.  HENT:     Head: Normocephalic.     Left Ear: Hearing, tympanic membrane, ear canal and external ear normal.     Nose: Nose normal. No congestion.     Mouth/Throat:     Dentition: Abnormal dentition (lower incisers loose).  Eyes:     General: No scleral icterus.    Extraocular Movements: Extraocular movements intact.  Cardiovascular:      Rate and Rhythm: Normal rate  and regular rhythm.     Pulses: Normal pulses.     Heart sounds: Normal heart sounds.  Pulmonary:     Effort: Pulmonary effort is normal. No respiratory distress.     Breath sounds: Normal breath sounds.  Abdominal:     General: Abdomen is flat. Bowel sounds are normal.     Palpations: Abdomen is soft.  Musculoskeletal:        General: Normal range of motion.     Cervical back: Normal range of motion. Edema present. Muscular tenderness (left, under jaw) present. Normal range of motion.  Lymphadenopathy:     Cervical: No cervical adenopathy.  Skin:    General: Skin is warm and dry.     Findings: No rash.  Neurological:     Mental Status: She is alert and oriented to person, place, and time. Mental status is at baseline.     Cranial Nerves: Cranial nerves 2-12 are intact.     Gait: Gait normal.  Psychiatric:        Mood and Affect: Mood normal.        Behavior: Behavior normal.        Thought Content: Thought content normal.        Judgment: Judgment normal.       CT Head WO Contrast  Anatomical Region Laterality Modality  Head -- Computed Tomography   Impression  1. No acute intracranial process. 2. No acute facial bone fracture. Small left paramedian frontal scalp and forehead hematoma. 3. No acute cervical spine fracture or traumatic listhesis. 4. Punctate piece of metallic debris along the right premalar soft tissues.   Electronically Signed   By: Wiliam Ke M.D.   On: 02/15/2023 03:36 Narrative  CLINICAL DATA:  Blunt trauma, fall  EXAM: CT HEAD WITHOUT CONTRAST  CT MAXILLOFACIAL WITHOUT CONTRAST  CT CERVICAL SPINE WITHOUT CONTRAST  TECHNIQUE: Multidetector CT imaging of the head, cervical spine, and maxillofacial structures were performed using the standard protocol without intravenous contrast. Multiplanar CT image reconstructions of the cervical spine and maxillofacial structures were also generated.  RADIATION  DOSE REDUCTION: This exam was performed according to the departmental dose-optimization program which includes automated exposure control, adjustment of the mA and/or kV according to patient size and/or use of iterative reconstruction technique.  COMPARISON:  09/23/2019 CT head, face, and cervical spine  FINDINGS: CT HEAD FINDINGS  Brain: No evidence of acute infarct, hemorrhage, mass, mass effect, or midline shift. No hydrocephalus or extra-axial fluid collection.  Vascular: No hyperdense vessel.  Skull: Negative for fracture or focal lesion.  CT MAXILLOFACIAL FINDINGS  Osseous: No fracture or mandibular dislocation. No destructive process.  Orbits: No traumatic or inflammatory finding.  Sinuses: Small mucous retention cysts in the maxillary sinuses. Mild mucosal thickening in the ethmoid air cells. Otherwise clear.  Soft tissues: Small left paramedian frontal scalp and forehead hematoma. Punctate piece of metallic debris along the right premalar soft tissues (series 3, image 62).  CT CERVICAL SPINE FINDINGS  Alignment: No listhesis. Straightening of the normal cervical lordosis.  Skull base and vertebrae: No acute fracture. No primary bone lesion or focal pathologic process.  Soft tissues and spinal canal: No prevertebral fluid or swelling. No visible canal hematoma.  Disc levels: Degenerative changes in the cervical spine. No high-grade spinal canal stenosis.  Upper chest: Negative.  Other: None. Procedure Note  Drue Stager, MD - 02/15/2023 Formatting of this note might be different from the original. CLINICAL DATA:  Blunt trauma, fall  EXAM: CT HEAD WITHOUT CONTRAST  CT MAXILLOFACIAL WITHOUT CONTRAST  CT CERVICAL SPINE WITHOUT CONTRAST  TECHNIQUE: Multidetector CT imaging of the head, cervical spine, and maxillofacial structures were performed using the standard protocol without intravenous contrast. Multiplanar CT image reconstructions of  the cervical spine and maxillofacial structures were also generated.  RADIATION DOSE REDUCTION: This exam was performed according to the departmental dose-optimization program which includes automated exposure control, adjustment of the mA and/or kV according to patient size and/or use of iterative reconstruction technique.  COMPARISON:  09/23/2019 CT head, face, and cervical spine  FINDINGS: CT HEAD FINDINGS  Brain: No evidence of acute infarct, hemorrhage, mass, mass effect, or midline shift. No hydrocephalus or extra-axial fluid collection.  Vascular: No hyperdense vessel.  Skull: Negative for fracture or focal lesion.  CT MAXILLOFACIAL FINDINGS  Osseous: No fracture or mandibular dislocation. No destructive process.  Orbits: No traumatic or inflammatory finding.  Sinuses: Small mucous retention cysts in the maxillary sinuses. Mild mucosal thickening in the ethmoid air cells. Otherwise clear.  Soft tissues: Small left paramedian frontal scalp and forehead hematoma. Punctate piece of metallic debris along the right premalar soft tissues (series 3, image 62).  CT CERVICAL SPINE FINDINGS  Alignment: No listhesis. Straightening of the normal cervical lordosis.  Skull base and vertebrae: No acute fracture. No primary bone lesion or focal pathologic process.  Soft tissues and spinal canal: No prevertebral fluid or swelling. No visible canal hematoma.  Disc levels: Degenerative changes in the cervical spine. No high-grade spinal canal stenosis.  Upper chest: Negative.  Other: None.  IMPRESSION: 1. No acute intracranial process. 2. No acute facial bone fracture. Small left paramedian frontal scalp and forehead hematoma. 3. No acute cervical spine fracture or traumatic listhesis. 4. Punctate piece of metallic debris along the right premalar soft tissues.   Electronically Signed   By: Wiliam Ke M.D.   On: 02/15/2023 03:36 Exam End: 02/15/23 03:21    Specimen Collected: 02/15/23 03:26 Last Resulted: 02/15/23 03:36  Received From: [facility]  Result Received: 02/18/23 07:56   View Encounter   CT Spine Cervical WO Contrast  Anatomical Region Laterality Modality  C-spine -- Computed Tomography   Impression  1. No acute intracranial process. 2. No acute facial bone fracture. Small left paramedian frontal scalp and forehead hematoma. 3. No acute cervical spine fracture or traumatic listhesis. 4. Punctate piece of metallic debris along the right premalar soft tissues.   Electronically Signed   By: Wiliam Ke M.D.   On: 02/15/2023 03:36 Narrative  CLINICAL DATA:  Blunt trauma, fall  EXAM: CT HEAD WITHOUT CONTRAST  CT MAXILLOFACIAL WITHOUT CONTRAST  CT CERVICAL SPINE WITHOUT CONTRAST  TECHNIQUE: Multidetector CT imaging of the head, cervical spine, and maxillofacial structures were performed using the standard protocol without intravenous contrast. Multiplanar CT image reconstructions of the cervical spine and maxillofacial structures were also generated.  RADIATION DOSE REDUCTION: This exam was performed according to the departmental dose-optimization program which includes automated exposure control, adjustment of the mA and/or kV according to patient size and/or use of iterative reconstruction technique.  COMPARISON:  09/23/2019 CT head, face, and cervical spine  FINDINGS: CT HEAD FINDINGS  Brain: No evidence of acute infarct, hemorrhage, mass, mass effect, or midline shift. No hydrocephalus or extra-axial fluid collection.  Vascular: No hyperdense vessel.  Skull: Negative for fracture or focal lesion.  CT MAXILLOFACIAL FINDINGS  Osseous: No fracture or mandibular dislocation. No destructive process.  Orbits: No traumatic or inflammatory finding.  Sinuses: Small mucous retention cysts in the maxillary sinuses. Mild mucosal thickening in the ethmoid air cells. Otherwise clear.  Soft tissues:  Small left paramedian frontal scalp and forehead hematoma. Punctate piece of metallic debris along the right premalar soft tissues (series 3, image 62).  CT CERVICAL SPINE FINDINGS  Alignment: No listhesis. Straightening of the normal cervical lordosis.  Skull base and vertebrae: No acute fracture. No primary bone lesion or focal pathologic process.  Soft tissues and spinal canal: No prevertebral fluid or swelling. No visible canal hematoma.  Disc levels: Degenerative changes in the cervical spine. No high-grade spinal canal stenosis.  Upper chest: Negative.  Other: None. Procedure Note  Drue Stager, MD - 02/15/2023 Formatting of this note might be different from the original. CLINICAL DATA:  Blunt trauma, fall  EXAM: CT HEAD WITHOUT CONTRAST  CT MAXILLOFACIAL WITHOUT CONTRAST  CT CERVICAL SPINE WITHOUT CONTRAST  TECHNIQUE: Multidetector CT imaging of the head, cervical spine, and maxillofacial structures were performed using the standard protocol without intravenous contrast. Multiplanar CT image reconstructions of the cervical spine and maxillofacial structures were also generated.  RADIATION DOSE REDUCTION: This exam was performed according to the departmental dose-optimization program which includes automated exposure control, adjustment of the mA and/or kV according to patient size and/or use of iterative reconstruction technique.  COMPARISON:  09/23/2019 CT head, face, and cervical spine  FINDINGS: CT HEAD FINDINGS  Brain: No evidence of acute infarct, hemorrhage, mass, mass effect, or midline shift. No hydrocephalus or extra-axial fluid collection.  Vascular: No hyperdense vessel.  Skull: Negative for fracture or focal lesion.  CT MAXILLOFACIAL FINDINGS  Osseous: No fracture or mandibular dislocation. No destructive process.  Orbits: No traumatic or inflammatory finding.  Sinuses: Small mucous retention cysts in the maxillary sinuses.  Mild mucosal thickening in the ethmoid air cells. Otherwise clear.  Soft tissues: Small left paramedian frontal scalp and forehead hematoma. Punctate piece of metallic debris along the right premalar soft tissues (series 3, image 62).  CT CERVICAL SPINE FINDINGS  Alignment: No listhesis. Straightening of the normal cervical lordosis.  Skull base and vertebrae: No acute fracture. No primary bone lesion or focal pathologic process.  Soft tissues and spinal canal: No prevertebral fluid or swelling. No visible canal hematoma.  Disc levels: Degenerative changes in the cervical spine. No high-grade spinal canal stenosis.  Upper chest: Negative.  Other: None.  IMPRESSION: 1. No acute intracranial process. 2. No acute facial bone fracture. Small left paramedian frontal scalp and forehead hematoma. 3. No acute cervical spine fracture or traumatic listhesis. 4. Punctate piece of metallic debris along the right premalar soft tissues.   Electronically Signed   By: Wiliam Ke M.D.   On: 02/15/2023 03:36 Exam End: 02/15/23 03:21   Specimen Collected: 02/15/23 03:26 Last Resulted: 02/15/23 03:36  Received From: [facility]  Result Received: 02/18/23 07:56   View Encounter   CT Facial Bones WO Contrast  Anatomical Region Laterality Modality  Skull -- Computed Tomography   Impression  1. No acute intracranial process. 2. No acute facial bone fracture. Small left paramedian frontal scalp and forehead hematoma. 3. No acute cervical spine fracture or traumatic listhesis. 4. Punctate piece of metallic debris along the right premalar soft tissues.   Electronically Signed   By: Wiliam Ke M.D.   On: 02/15/2023 03:36 Narrative  CLINICAL DATA:  Blunt trauma, fall  EXAM: CT HEAD WITHOUT CONTRAST  CT MAXILLOFACIAL WITHOUT CONTRAST  CT CERVICAL SPINE WITHOUT CONTRAST  TECHNIQUE: Multidetector  CT imaging of the head, cervical spine, and maxillofacial structures were  performed using the standard protocol without intravenous contrast. Multiplanar CT image reconstructions of the cervical spine and maxillofacial structures were also generated.  RADIATION DOSE REDUCTION: This exam was performed according to the departmental dose-optimization program which includes automated exposure control, adjustment of the mA and/or kV according to patient size and/or use of iterative reconstruction technique.  COMPARISON:  09/23/2019 CT head, face, and cervical spine  FINDINGS: CT HEAD FINDINGS  Brain: No evidence of acute infarct, hemorrhage, mass, mass effect, or midline shift. No hydrocephalus or extra-axial fluid collection.  Vascular: No hyperdense vessel.  Skull: Negative for fracture or focal lesion.  CT MAXILLOFACIAL FINDINGS  Osseous: No fracture or mandibular dislocation. No destructive process.  Orbits: No traumatic or inflammatory finding.  Sinuses: Small mucous retention cysts in the maxillary sinuses. Mild mucosal thickening in the ethmoid air cells. Otherwise clear.  Soft tissues: Small left paramedian frontal scalp and forehead hematoma. Punctate piece of metallic debris along the right premalar soft tissues (series 3, image 62).  CT CERVICAL SPINE FINDINGS  Alignment: No listhesis. Straightening of the normal cervical lordosis.  Skull base and vertebrae: No acute fracture. No primary bone lesion or focal pathologic process.  Soft tissues and spinal canal: No prevertebral fluid or swelling. No visible canal hematoma.  Disc levels: Degenerative changes in the cervical spine. No high-grade spinal canal stenosis.  Upper chest: Negative.  Other: None. Procedure Note  Drue Stager, MD - 02/15/2023 Formatting of this note might be different from the original. CLINICAL DATA:  Blunt trauma, fall  EXAM: CT HEAD WITHOUT CONTRAST  CT MAXILLOFACIAL WITHOUT CONTRAST  CT CERVICAL SPINE WITHOUT  CONTRAST  TECHNIQUE: Multidetector CT imaging of the head, cervical spine, and maxillofacial structures were performed using the standard protocol without intravenous contrast. Multiplanar CT image reconstructions of the cervical spine and maxillofacial structures were also generated.  RADIATION DOSE REDUCTION: This exam was performed according to the departmental dose-optimization program which includes automated exposure control, adjustment of the mA and/or kV according to patient size and/or use of iterative reconstruction technique.  COMPARISON:  09/23/2019 CT head, face, and cervical spine  FINDINGS: CT HEAD FINDINGS  Brain: No evidence of acute infarct, hemorrhage, mass, mass effect, or midline shift. No hydrocephalus or extra-axial fluid collection.  Vascular: No hyperdense vessel.  Skull: Negative for fracture or focal lesion.  CT MAXILLOFACIAL FINDINGS  Osseous: No fracture or mandibular dislocation. No destructive process.  Orbits: No traumatic or inflammatory finding.  Sinuses: Small mucous retention cysts in the maxillary sinuses. Mild mucosal thickening in the ethmoid air cells. Otherwise clear.  Soft tissues: Small left paramedian frontal scalp and forehead hematoma. Punctate piece of metallic debris along the right premalar soft tissues (series 3, image 62).  CT CERVICAL SPINE FINDINGS  Alignment: No listhesis. Straightening of the normal cervical lordosis.  Skull base and vertebrae: No acute fracture. No primary bone lesion or focal pathologic process.  Soft tissues and spinal canal: No prevertebral fluid or swelling. No visible canal hematoma.  Disc levels: Degenerative changes in the cervical spine. No high-grade spinal canal stenosis.  Upper chest: Negative.  Other: None.  IMPRESSION: 1. No acute intracranial process. 2. No acute facial bone fracture. Small left paramedian frontal scalp and forehead hematoma. 3. No acute cervical spine  fracture or traumatic listhesis. 4. Punctate piece of metallic debris along the right premalar soft tissues.   Electronically Signed   By: Wiliam Ke  M.D.   On: 02/15/2023 03:36 Exam End: 02/15/23 03:21   Specimen Collected: 02/15/23 03:26 Last Resulted: 02/15/23 03:36  Received From: [facility]  Result Received: 02/18/23 07:56   View Encounter   ECG 12 lead  Narrative Performed by [facility] CV WF MUSE This result has an attachment that is not available. Ventricular Rate                   84        BPM                 Atrial Rate                        84        BPM                 P-R Interval                       160       ms                   QRS Duration                       86        ms                   Q-T Interval                       378       ms                   QTC Calculation Bazett             446       ms                   Calculated P Axis                  63        degrees             Calculated R Axis                  11        degrees             Calculated T Axis                  6         degrees              Sinus rhythm Normal ECG Confirmed by Heide Scales  [id number]  on 02-15-2023 7 51 08 AM Procedure Note  Clayborn Bigness, MD - 02/15/2023 Formatting of this note might be different from the original. Ventricular Rate                   84        BPM                 Atrial Rate                        84        BPM                 P-R  Interval                       160       ms                   QRS Duration                       86        ms                   Q-T Interval                       378       ms                   QTC Calculation Bazett             446       ms                   Calculated P Axis                  63        degrees             Calculated R Axis                  11        degrees             Calculated T Axis                  6         degrees              Sinus rhythm Normal ECG Confirmed by Heide Scales  [id number]  on 02-15-2023 7  51 08 AM Specimen Collected: 02/15/23 02:48   Performed by: [facility] CV WF MUSE Last Resulted: 02/15/23 07:51  Received From: [facility]  Result Received: 02/18/23 07:56   View Encounter       Contains abnormal data CBC with Differential Specimen: Blood - Venous structure (body structure) Component Ref Range & Units 2 wk ago  WBC 4.40 - 11.00 10*3/uL 11.25 High   RBC 4.10 - 5.10 10*6/uL 4.79  Hemoglobin 12.3 - 15.3 g/dL 09.8  Hematocrit 11.9 - 44.6 % 43.7  Mean Corpuscular Volume (MCV) 80.0 - 96.0 fL 91.2  Mean Corpuscular Hemoglobin (MCH) 27.5 - 33.2 pg 30.1  Mean Corpuscular Hemoglobin Conc (MCHC) 33.0 - 37.0 g/dL 14.7  Red Cell Distribution Width (RDW) 12.3 - 17.0 % 15.0  Platelet Count (PLT) 150 - 450 10*3/uL 307  Mean Platelet Volume (MPV) 6.8 - 10.2 fL 7.1  Neutrophils % % 66  Lymphocytes % % 25  Monocytes % % 7  Eosinophils % % 2  Basophils % % 1  Neutrophils Absolute 1.80 - 7.80 10*3/uL 7.40  Lymphocytes # 1.00 - 4.80 10*3/uL 2.80  Monocytes # 0.00 - 0.80 10*3/uL 0.70  Eosinophils # 0.00 - 0.50 10*3/uL 0.20  Basophils # 0.00 - 0.20 10*3/uL 0.10  Resulting Agency [facility](CLIA#[id number])  Specimen Collected: 02/15/23 02:47   Performed by: Nada Maclachlan [facility](CLIA#[id number]) Last Resulted: 02/15/23 02:52  Received From: [facility]  Result Received: 02/18/23 07:56   View Encounter    Contains abnormal data Comprehensive Metabolic Panel Specimen: Blood - Venous structure (body  structure) Component Ref Range & Units 2 wk ago Comments  Sodium 136 - 145 mmol/L 140   Potassium 3.4 - 4.5 mmol/L 3.6   Chloride 98 - 107 mmol/L 106   CO2 21 - 31 mmol/L 27   Anion Gap 6 - 14 mmol/L 7   Glucose, Random 70 - 99 mg/dL 191 High    Blood Urea Nitrogen (BUN) 7 - 25 mg/dL 11   Creatinine 4.78 - 1.20 mg/dL 2.95   eGFR >62 ZH/YQM/5.78I6 >90 GFR estimated by CKD-EPI equations(NKF 2021).  "Recommend confirmation of  Cr-based eGFR by using Cys-based eGFR and other filtration markers (if applicable) in complex cases and clinical decision-making, as needed."  Albumin 3.5 - 5.7 g/dL 3.4 Low    Total Protein 6.4 - 8.9 g/dL 6.5   Bilirubin, Total 0.3 - 1.0 mg/dL 0.2 Low    Alkaline Phosphatase (ALP) 34 - 104 U/L 94   Aspartate Aminotransferase (AST) 13 - 39 U/L 16   Alanine Aminotransferase (ALT) 7 - 52 U/L 18   Calcium 8.6 - 10.3 mg/dL 8.7   BUN/Creatinine Ratio  Creatinine is normal, ratio is not clinically indicated.  Corrected Calcium mg/dL 9.2 Reference Ranges Not Established. Recommend testing using ionized calcium.  Resulting Agency [facility](CLIA#[id number])   Specimen Collected: 02/15/23 02:47   Performed by: Noralee Stain [facility](CLIA#[id number]) Last Resulted: 02/15/23 03:22  Received From: [facility]  Result Received: 02/18/23 07:56   View Encounter    CT LUMBAR SPINE WO CONTRAST  Result Date: 01/23/2023 CLINICAL DATA:  Back pain and gait abnormality EXAM: CT LUMBAR SPINE WITHOUT CONTRAST TECHNIQUE: Multidetector CT imaging of the lumbar spine was performed without intravenous contrast administration. Multiplanar CT image reconstructions were also generated. RADIATION DOSE REDUCTION: This exam was performed according to the departmental dose-optimization program which includes automated exposure control, adjustment of the mA and/or kV according to patient size and/or use of iterative reconstruction technique. COMPARISON:  None Available. FINDINGS: Segmentation: 5 lumbar type vertebrae. Alignment: Normal. Vertebrae: No acute fracture or focal pathologic process. Paraspinal and other soft tissues: Calcific aortic atherosclerosis. Disc levels: Severe bilateral L5 neural foraminal stenosis due to combination of disc bulge and endplate spurring. No other spinal canal or neural foraminal stenosis. IMPRESSION: 1. No acute fracture or static subluxation of the lumbar  spine. 2. Severe bilateral L5 neural foraminal stenosis due to combination of disc bulge and endplate spurring. Aortic Atherosclerosis (ICD10-I70.0). Electronically Signed   By: Deatra Robinson M.D.   On: 01/23/2023 20:13   MR BRAIN W WO CONTRAST  Result Date: 01/06/2023 CLINICAL DATA:  Provided history: Gait abnormality. Facial twitching. Bilateral leg pain. Bilateral leg weakness. Seizure-like activity. Additional history provided by the scanning technologist: the patient reports numbness in left hand, weakness in low back and legs. EXAM: MRI HEAD WITHOUT AND WITH CONTRAST TECHNIQUE: Multiplanar, multiecho pulse sequences of the brain and surrounding structures were obtained without and with intravenous contrast. CONTRAST:  10mL GADAVIST GADOBUTROL 1 MMOL/ML IV SOLN COMPARISON:  Report from head CT 09/23/2019 (images unavailable). FINDINGS: Brain: No age advanced or lobar predominant parenchymal atrophy. There are a few small foci of T2 FLAIR hyperintense signal abnormality within the bilateral frontal lobe white matter. These signal changes are nonspecific, but most often secondary to chronic small vessel ischemia. No cortical encephalomalacia is identified. No appreciable hippocampal size or signal asymmetry. There is no acute infarct. No evidence of an intracranial mass. No chronic intracranial blood products. No extra-axial fluid collection. No midline shift. No  pathologic intracranial enhancement identified. Vascular: Maintained flow voids within the proximal large arterial vessels. Skull and upper cervical spine: No focal suspicious marrow lesion. Sinuses/Orbits: No mass or acute finding within the imaged orbits. Small mucous retention cysts within the bilateral maxillary sinuses. IMPRESSION: 1. No evidence of an acute intracranial abnormality. 2. Mild multifocal T2 FLAIR hyperintense signal abnormality within the bilateral frontal lobe white matter, nonspecific but most often secondary to chronic small  vessel ischemia. 3. Otherwise unremarkable MRI appearance of the brain. 4. Small mucous retention cysts within the bilateral maxillary sinuses. Electronically Signed   By: Jackey Loge D.O.   On: 01/06/2023 08:03   EEG adult  Result Date: 12/09/2022 Van Clines, MD     12/27/2022  9:51 AM ELECTROENCEPHALOGRAM REPORT Date of Study: 12/09/2022 Patient's Name: Theresa Marquez MRN: [id number] Date of Birth: 24-Mar-1966 Referring Provider: Dr. Nita Sickle Clinical History: This is a 57 year old woman  with facial twitching, falls. EEG for classification. Medications: Cymbalta, Lasix, Aldactone, Vistaril Technical Summary: A multichannel digital EEG recording measured by the international 10-20 system with electrodes applied with paste and impedances below 5000 ohms performed in our laboratory with EKG monitoring in an awake and drowsy patient.  Hyperventilation and photic stimulation were performed.  The digital EEG was referentially recorded, reformatted, and digitally filtered in a variety of bipolar and referential montages for optimal display.  Description: The patient is awake and drowsy during the recording.  During maximal wakefulness, there is a symmetric, medium voltage 10 Hz posterior dominant rhythm that attenuates with eye opening.  The record is symmetric.  During drowsiness, there is an increase in theta slowing of the background. Sleep was not captured. Hyperventilation and photic stimulation did not elicit any abnormalities.  There were no epileptiform discharges or electrographic seizures seen.  EKG lead was unremarkable. Impression: This awake and drowsy EEG is normal.  Clinical Correlation: A normal EEG does not exclude a clinical diagnosis of epilepsy.  If further clinical questions remain, prolonged EEG may be helpful.  Clinical correlation is advised. Patrcia Dolly, M.D.    No results found for this or any previous visit (from the past 2160 hour(s)).      Garner Nash, MD, MS

## 2023-03-01 NOTE — Assessment & Plan Note (Signed)
Confirmed severe lumbar spine degenerative changes causing pain and numbness.  Plan: Continue physical therapy exercises as tolerated. Reassess MRI findings and refer to neurosurgery. Naproxen PRN for pain, and gabapentin 100 mg 3 times daily as needed

## 2023-03-01 NOTE — Assessment & Plan Note (Signed)
Noted small vessel ischemia on MRI without current symptoms. Plan: Start Rosuvastatin 5 mg daily to manage vascular risk. Monitor lipid levels and reassess as needed.

## 2023-03-01 NOTE — Assessment & Plan Note (Addendum)
Patient experiencing  "drop attacks" with no preceding warning. Potential causes include spinal nerve compression due to lumbar disease. Cannot rule out cardiac causes leading to syncope but prior cardiac workup reassuring.  Neurologic work up also reassuring ruling out structural brain disease and seizure.   Plan: Proceed with MRI of the lumbar spine. Consult neurosurgery for evaluation and management. Encourage the continuation of cardiac follow-up.

## 2023-03-14 ENCOUNTER — Encounter: Payer: Self-pay | Admitting: Gastroenterology

## 2023-03-16 ENCOUNTER — Ambulatory Visit (HOSPITAL_BASED_OUTPATIENT_CLINIC_OR_DEPARTMENT_OTHER)
Admission: RE | Admit: 2023-03-16 | Discharge: 2023-03-16 | Disposition: A | Discharge status: Home / Self Care | Payer: Medicaid Other | Source: Ambulatory Visit | Attending: Family Medicine | Admitting: Family Medicine

## 2023-03-16 DIAGNOSIS — M47815 Spondylosis without myelopathy or radiculopathy, thoracolumbar region: Secondary | ICD-10-CM | POA: Diagnosis not present

## 2023-03-16 DIAGNOSIS — M48061 Spinal stenosis, lumbar region without neurogenic claudication: Secondary | ICD-10-CM | POA: Diagnosis present

## 2023-03-16 DIAGNOSIS — M47817 Spondylosis without myelopathy or radiculopathy, lumbosacral region: Secondary | ICD-10-CM | POA: Diagnosis not present

## 2023-03-16 DIAGNOSIS — M5136 Other intervertebral disc degeneration, lumbar region: Secondary | ICD-10-CM | POA: Diagnosis not present

## 2023-03-16 DIAGNOSIS — M47816 Spondylosis without myelopathy or radiculopathy, lumbar region: Secondary | ICD-10-CM | POA: Diagnosis not present

## 2023-03-21 DIAGNOSIS — M546 Pain in thoracic spine: Secondary | ICD-10-CM | POA: Diagnosis not present

## 2023-03-21 DIAGNOSIS — M542 Cervicalgia: Secondary | ICD-10-CM | POA: Diagnosis not present

## 2023-03-21 DIAGNOSIS — R202 Paresthesia of skin: Secondary | ICD-10-CM | POA: Diagnosis not present

## 2023-03-21 DIAGNOSIS — Z6841 Body Mass Index (BMI) 40.0 and over, adult: Secondary | ICD-10-CM | POA: Diagnosis not present

## 2023-03-24 ENCOUNTER — Encounter: Payer: Self-pay | Admitting: Internal Medicine

## 2023-03-24 ENCOUNTER — Ambulatory Visit: Payer: Medicaid Other | Attending: Internal Medicine | Admitting: Internal Medicine

## 2023-03-24 VITALS — BP 129/81 | HR 83 | Resp 16 | Ht 63.25 in | Wt 283.0 lb

## 2023-03-24 DIAGNOSIS — I739 Peripheral vascular disease, unspecified: Secondary | ICD-10-CM | POA: Diagnosis not present

## 2023-03-24 DIAGNOSIS — M48061 Spinal stenosis, lumbar region without neurogenic claudication: Secondary | ICD-10-CM

## 2023-03-24 DIAGNOSIS — R899 Unspecified abnormal finding in specimens from other organs, systems and tissues: Secondary | ICD-10-CM | POA: Diagnosis not present

## 2023-03-24 DIAGNOSIS — R700 Elevated erythrocyte sedimentation rate: Secondary | ICD-10-CM

## 2023-03-24 HISTORY — DX: Elevated erythrocyte sedimentation rate: R70.0

## 2023-03-24 LAB — SEDIMENTATION RATE: Sed Rate: 51 mm/h — ABNORMAL HIGH (ref 0–30)

## 2023-03-24 NOTE — Progress Notes (Signed)
Office Visit Note  Patient: Theresa Marquez             Date of Birth: 05-Sep-1965           MRN: [id number]             PCP: Garnette Gunner, MD Referring: Garnette Gunner, MD Visit Date: 03/24/2023 Occupation: Currently out of work, Radio producer  Subjective:  New Patient (Initial Visit) (Patient states she has been passed around from doctor to doctor. Patient states she is having pain in her lower back. Patient states her left side is worse for pain than her right. Patient states she has pain in her left shoulder, hip, and knee. Patient states she has pain in both of her hands. Patient states she has pain in the top of her feet. )   History of Present Illness: Theresa Marquez is a 57 y.o. female here for evaluation of pain and weakness in extremities with elevated sedimentation rate.  She has some chronic back pain dating back further but current issues with more widespread pain and sensitivity stiffness and numbness mostly ongoing since about 1 year ago.  She did not recall any specific medical event or change before this around August last year.  She had been sick with COVID diagnosed presumptively over telemedicine visit earlier in the year was also sick with COVID in 2022 confirmed on testing.  Back pain has worsened this frequently awakens her at night cannot tolerate standing in 1 position for more than a few minutes.  Waking in the morning she can take up to half an hour before she has normal sensation throughout both legs and is able to walk.  Still has a lot of trouble raising her legs to the front such as to climb any steps.  Also has widespread sensitivity to pressure in her shoulder arm hands and throughout her legs.  This is all worse on the left side gets a mixture of cold and burning sensation but no complete loss of feeling.  She saw neurology with nerve testing for carpal tunnel syndrome consistent with mild impingement.  MRI brain was mostly unremarkable with some  chronic small vessel disease related changes.  MRI of the lumbar spine was recently obtained to follow-up CT scan that reported severe bilateral foraminal stenosis.  She states subsequent follow-up with neurosurgery clinic for evaluation did not see any significant disease on MRI but I do not see a report for this.  Most of her pain and symptom complaint is more over the muscle groups and severe stiffness rather than any specific tender or swollen joints.  She reports minimal response from low-dose gabapentin or as needed naproxen. States that she is dealing with chronic skin itching without visible associated rashes.  Also sees chronically congested with some sinus drainage and eye watering.  She was also recommended for sleep apnea evaluation by PCP.  Reports sleep is delayed and frequently interrupted due to back pain.  She was referred to physical therapy working on some lumbar flexion extension and sidebending exercises had not seen a lot of improvement so far.  She reports the therapist identified several "drop attacks."   Labs reviewed 09/2022 ANA neg RF neg CCP neg 14-3-3 neg ESR 66 CK 41  Activities of Daily Living:  Patient reports morning stiffness for 24 hours.   Patient Reports nocturnal pain.  Difficulty dressing/grooming: Reports Difficulty climbing stairs: Reports Difficulty getting out of chair: Reports Difficulty using hands for taps,  buttons, cutlery, and/or writing: Reports  Review of Systems  Constitutional:  Positive for fatigue.  HENT:  Positive for mouth sores and mouth dryness.   Eyes:  Positive for dryness.  Respiratory:  Negative for shortness of breath.   Cardiovascular:  Negative for chest pain and palpitations.  Gastrointestinal:  Negative for blood in stool, constipation and diarrhea.  Endocrine: Positive for increased urination.  Genitourinary:  Negative for involuntary urination.  Musculoskeletal:  Positive for joint pain, gait problem, joint pain, joint  swelling, myalgias, muscle weakness, morning stiffness, muscle tenderness and myalgias.  Skin:  Positive for rash. Negative for color change, hair loss and sensitivity to sunlight.  Allergic/Immunologic: Negative for susceptible to infections.  Neurological:  Positive for dizziness and headaches.  Hematological:  Positive for swollen glands.  Psychiatric/Behavioral:  Positive for sleep disturbance. Negative for depressed mood. The patient is not nervous/anxious.     PMFS History:  Patient Active Problem List   Diagnosis Date Noted   Sedimentation rate elevation 03/24/2023   Syncope 03/01/2023   Frequent falls 03/01/2023   Lumbar foraminal stenosis 03/01/2023   Small vessel disease (HCC) 03/01/2023   Closed fracture of tooth 03/01/2023   Neuralgia 12/10/2022   Rash of both hands 12/10/2022   Weakness of extremity 11/14/2022   Seizure-like activity (HCC) 11/14/2022   Cervicalgia 08/24/2022   Sleep disturbance 08/24/2022   Pruritic dermatitis 08/24/2022   Chronic midline low back pain with left-sided sciatica 02/25/2022   OSA (obstructive sleep apnea) 12/14/2021   AKI (acute kidney injury) (HCC) 12/14/2021   Wheezing 12/14/2021   Rhinitis 11/30/2021   Obesity, morbid, BMI 40.0-49.9 (HCC) 11/10/2021   Tobacco abuse 11/10/2021   Bilateral lower extremity edema 11/10/2021   Recurrent sinus infections 11/10/2021   Family history of thyroid disease 10/05/2021   Neck swelling 10/05/2021   Allergy 08/09/2017   Fatigue 12/24/2014    Past Medical History:  Diagnosis Date   Allergy    Endometrial polyp 08/09/2017   Formatting of this note might be different from the original. S/p hysteroscopy with [facility] 10/2017 with Dr Elmore Guise, was benign    Family History  Problem Relation Age of Onset   Kidney disease Mother    Cancer Father    Cerebral palsy Daughter    Past Surgical History:  Procedure Laterality Date   DG GALL BLADDER Right    Social History   Social History Narrative    Right Handed    Lives in a one story home with a basement   Immunization History  Administered Date(s) Administered   Influenza,inj,Quad PF,6+ Mos 06/19/2018   Tdap 09/23/2019   Zoster Recombinant(Shingrix) 11/30/2021     Objective: Vital Signs: BP 129/81 (BP Location: Right Arm, Patient Position: Sitting, Cuff Size: Normal)   Pulse 83   Resp 16   Ht 5' 3.25" (1.607 m)   Wt 283 lb (128.4 kg)   LMP  (LMP Unknown)   BMI 49.74 kg/m    Physical Exam Constitutional:      Appearance: She is obese.  HENT:     Nose:     Comments: Clear drainage    Mouth/Throat:     Mouth: Mucous membranes are moist.     Pharynx: Oropharynx is clear.  Eyes:     Conjunctiva/sclera: Conjunctivae normal.     Comments: Watering  Cardiovascular:     Rate and Rhythm: Normal rate and regular rhythm.  Pulmonary:     Effort: Pulmonary effort is normal.     Breath  sounds: Normal breath sounds.  Musculoskeletal:     Right lower leg: No edema.     Left lower leg: No edema.  Lymphadenopathy:     Cervical: No cervical adenopathy.  Skin:    General: Skin is warm and dry.     Findings: No rash.  Neurological:     Mental Status: She is alert.  Psychiatric:        Mood and Affect: Mood normal.      Musculoskeletal Exam:  Widespread muscular tenderness to pressure throughout upper arms, upper and lower legs, worse on left side extending over forearm and wrist Mid back left sided paraspinal muscle tenderness with sharp pain radiation up and down back Lateral hip pain provoked with internal rotation on both sides Knees full ROM no palpable effusion warmth or erythema   Investigation: No additional findings.  Imaging: No results found.  Recent Labs: Lab Results  Component Value Date   WBC 14.0 (H) 08/31/2022   HGB 14.8 08/31/2022   PLT 350 08/31/2022   NA 134 (L) 08/31/2022   K 3.9 08/31/2022   CL 100 08/31/2022   CO2 27 08/31/2022   GLUCOSE 118 (H) 08/31/2022   BUN 11 08/31/2022    CREATININE 0.87 08/31/2022   BILITOT 0.5 08/31/2022   ALKPHOS 101 08/31/2022   AST 20 08/31/2022   ALT 15 08/31/2022   PROT 7.8 08/31/2022   ALBUMIN 3.3 (L) 08/31/2022   CALCIUM 9.1 08/31/2022    Speciality Comments: No specialty comments available.  Procedures:  No procedures performed Allergies: Codeine and Prednisone   Assessment / Plan:     Visit Diagnoses: Sedimentation rate elevation - Plan: Sedimentation rate, CK, IgG, IgA, IgM, C3 and C4  Unclear cause of high sedimentation rate pretty extensive lab work has already been checked screening for autoimmune serology also had protein electrophoresis and workup with hematology that was unrevealing.  Will recheck the sed rate and CK also checking serum complements and quantitative immunoglobulins.  Does have severe persistent watering and drainage in eyes question some degree of allergy involvement. Current daily cigarette use can explain mild degree of elevated sedimentation rate. Widespread myofascial pain on exam.  Myofascial pain or fibromyalgia syndrome although would not account for lab abnormalities typically.  Could raise possibility of some kind of small fiber neuropathy if continues with evidence of inflammation and no underlying cause found.  Lumbar foraminal stenosis  Chronic back pain mixed report of imaging by my review formal lumbar MRI result is still pending.  Symptoms also likely worsening during the past year with ongoing weight gain and deconditioning  Small vessel disease (HCC)  No other findings reviewed so far suggestive for systemic small vessel vasculitis.  Orders: Orders Placed This Encounter  Procedures   Sedimentation rate   CK   IgG, IgA, IgM   C3 and C4   No orders of the defined types were placed in this encounter.   Follow-Up Instructions: No follow-ups on file.   Fuller Plan, MD  Note - This record has been created using [employer].  Chart creation errors have been sought,  but may not always  have been located. Such creation errors do not reflect on  the standard of medical care.

## 2023-03-25 ENCOUNTER — Encounter: Payer: Self-pay | Admitting: Oncology

## 2023-03-25 ENCOUNTER — Encounter: Payer: Self-pay | Admitting: Medical Oncology

## 2023-03-25 ENCOUNTER — Inpatient Hospital Stay: Payer: Medicaid Other | Admitting: Oncology

## 2023-03-25 ENCOUNTER — Inpatient Hospital Stay: Payer: Medicaid Other | Attending: Hematology & Oncology

## 2023-03-25 LAB — IGG, IGA, IGM
IgG (Immunoglobin G), Serum: 1592 mg/dL (ref 600–1640)
IgM, Serum: 105 mg/dL (ref 50–300)
Immunoglobulin A: 207 mg/dL (ref 47–310)

## 2023-03-25 LAB — C3 AND C4
C3 Complement: 188 mg/dL (ref 83–193)
C4 Complement: 34 mg/dL (ref 15–57)

## 2023-03-25 LAB — CK: Total CK: 35 U/L (ref 29–143)

## 2023-03-25 NOTE — Progress Notes (Deleted)
HEMATOLOGY-ONCOLOGY PROGRESS NOTE  ASSESSMENT AND PLAN: 1.  Leukocytosis  SUBJECTIVE: Theresa Marquez returns for scheduled follow-up. ***  REVIEW OF SYSTEMS:   ROS  I have reviewed the past medical history, past surgical history, social history and family history with the patient and they are unchanged from previous note.   PHYSICAL EXAMINATION: ECOG PERFORMANCE STATUS: {CHL ONC ECOG PS:[phone]}  There were no vitals filed for this visit. There were no vitals filed for this visit.  Intake/Output from previous day: No intake/output data recorded.  Physical Exam  LABORATORY DATA:  I have reviewed the data as listed    Latest Ref Rng & Units 08/31/2022    2:46 PM 08/24/2022    3:34 PM 05/10/2022    3:06 PM  CMP  Glucose 70 - 99 mg/dL 433  95  86   BUN 6 - 20 mg/dL 11  15  6    Creatinine 0.44 - 1.00 mg/dL 2.95  1.88  4.16   Sodium 135 - 145 mmol/L 134  140  143   Potassium 3.5 - 5.1 mmol/L 3.9  3.9  4.2   Chloride 98 - 111 mmol/L 100  103  99   CO2 22 - 32 mmol/L 27  28  24    Calcium 8.9 - 10.3 mg/dL 9.1  9.3  9.5   Total Protein 6.5 - 8.1 g/dL 7.8  6.7    Total Bilirubin 0.3 - 1.2 mg/dL 0.5  0.3    Alkaline Phos 38 - 126 U/L 101  95    AST 15 - 41 U/L 20  14    ALT 0 - 44 U/L 15  15      Lab Results  Component Value Date   WBC 14.0 (H) 08/31/2022   HGB 14.8 08/31/2022   HCT 46.4 (H) 08/31/2022   MCV 92.6 08/31/2022   PLT 350 08/31/2022   NEUTROABS 9.3 (H) 08/31/2022    No results found for: "CEA1", "CEA", "SAY301", "CA125", "PSA1"  No results found.   Future Appointments  Date Time Provider Department Center  03/25/2023  1:30 PM Myrtis Ser, NP [facility] None  04/18/2023  2:00 PM [facility] PREVISIT RM 52 [facility]  05/02/2023  1:30 PM Cirigliano, Vito V, DO [facility]  05/31/2023 10:40 AM Garnette Gunner, MD [facility]     Clenton Pare, DNP, AGPCNP-BC 03/25/23

## 2023-04-01 ENCOUNTER — Other Ambulatory Visit: Payer: Self-pay | Admitting: Family Medicine

## 2023-04-01 DIAGNOSIS — R195 Other fecal abnormalities: Secondary | ICD-10-CM

## 2023-04-01 DIAGNOSIS — Z1212 Encounter for screening for malignant neoplasm of rectum: Secondary | ICD-10-CM

## 2023-04-01 DIAGNOSIS — Z1211 Encounter for screening for malignant neoplasm of colon: Secondary | ICD-10-CM

## 2023-04-07 ENCOUNTER — Other Ambulatory Visit (HOSPITAL_BASED_OUTPATIENT_CLINIC_OR_DEPARTMENT_OTHER): Payer: Self-pay | Admitting: Surgery

## 2023-04-07 DIAGNOSIS — M546 Pain in thoracic spine: Secondary | ICD-10-CM

## 2023-04-14 DIAGNOSIS — Z1211 Encounter for screening for malignant neoplasm of colon: Secondary | ICD-10-CM | POA: Diagnosis not present

## 2023-04-14 DIAGNOSIS — Z1212 Encounter for screening for malignant neoplasm of rectum: Secondary | ICD-10-CM | POA: Diagnosis not present

## 2023-04-17 ENCOUNTER — Ambulatory Visit (HOSPITAL_BASED_OUTPATIENT_CLINIC_OR_DEPARTMENT_OTHER): Payer: Medicaid Other

## 2023-04-18 ENCOUNTER — Telehealth: Payer: Self-pay | Admitting: *Deleted

## 2023-04-18 ENCOUNTER — Ambulatory Visit: Payer: Medicaid Other

## 2023-04-18 NOTE — Telephone Encounter (Signed)
RN call for Pre-vist pt states"Why do we need to do this? Yall just sent me a colorguard and I just sent it in." Pt desires to cancel procedure and pre-visit at this time.

## 2023-04-22 LAB — COLOGUARD: COLOGUARD: POSITIVE — AB

## 2023-04-23 NOTE — Addendum Note (Signed)
Addended by: Fanny Bien B on: 04/23/2023 01:58 PM   Modules accepted: Orders

## 2023-04-24 ENCOUNTER — Ambulatory Visit (HOSPITAL_BASED_OUTPATIENT_CLINIC_OR_DEPARTMENT_OTHER): Payer: Medicaid Other

## 2023-05-02 ENCOUNTER — Encounter: Payer: Medicaid Other | Admitting: Gastroenterology

## 2023-05-08 ENCOUNTER — Ambulatory Visit (HOSPITAL_BASED_OUTPATIENT_CLINIC_OR_DEPARTMENT_OTHER)
Admission: RE | Admit: 2023-05-08 | Discharge: 2023-05-08 | Disposition: A | Discharge status: Home / Self Care | Payer: Medicaid Other | Source: Ambulatory Visit | Attending: Surgery | Admitting: Surgery

## 2023-05-08 DIAGNOSIS — M546 Pain in thoracic spine: Secondary | ICD-10-CM | POA: Diagnosis present

## 2023-05-31 ENCOUNTER — Encounter: Payer: Medicaid Other | Admitting: Family Medicine

## 2023-06-06 NOTE — Progress Notes (Unsigned)
Cardiology Office Note:    Date:  06/06/2023   ID:  [name], DOB Apr 11, 1966, MRN [id number]  PCP:  Garnette Gunner, MD  Cardiologist:  Norman Herrlich, MD    Referring MD: Garnette Gunner, MD    ASSESSMENT:    1. Bilateral lower extremity edema   2. OSA (obstructive sleep apnea)   3. Morbid obesity (HCC)    PLAN:    In order of problems listed above:  ***   Next appointment: ***   Medication Adjustments/Labs and Tests Ordered: Current medicines are reviewed at length with the patient today.  Concerns regarding medicines are outlined above.  No orders of the defined types were placed in this encounter.  No orders of the defined types were placed in this encounter.    History of Present Illness:    [name] is a 57 y.o. female with a hx of lower extremity edema and shortness of breath in the context of high-dose steroid therapy obstructive sleep apnea heavy history of cigarette smoking and probable COPD last seen 05/10/2022.  She was admitted to [facility] 12/01/2021 to 12/07/2021 which shortness of breath and edema and diagnosed as heart failure.  She diuresed in hospital weight down 10 pounds over 3 days and was transitioned to oral furosemide and continue Aldactone and SGLT2 inhibitor.  Other problems included suspected sleep apnea chest tightness with a normal high-sensitivity troponin obesity with a BMI of 48.  She also has a 40-pack-year cigarette smoking history.   Echocardiogram 12/02/2021 was technically difficult left ventricular systolic function is normal EF 55 to 60% with grade 1 diastolic dysfunction.  The right ventricle is normal size and function and pulmonary pressure could not be assessed there is no valvular abnormality noted the inferior vena cava was dilated consistent with elevated right atrial and venous pressure.  Compliance with diet, lifestyle and medications: *** Past Medical History:  Diagnosis Date   Allergy     Endometrial polyp 08/09/2017   Formatting of this note might be different from the original. S/p hysteroscopy with [facility] 10/2017 with Dr Elmore Guise, was benign    Current Medications: No outpatient medications have been marked as taking for the 06/07/23 encounter (Appointment) with Baldo Daub, MD.      EKGs/Labs/Other Studies Reviewed:    The following studies were reviewed today:  Cardiac Studies & Procedures       ECHOCARDIOGRAM  ECHOCARDIOGRAM COMPLETE 12/02/2021  Narrative ECHOCARDIOGRAM REPORT    Patient Name:   Theresa Marquez Date of Exam: 12/02/2021 Medical Rec #:  [id number]       Height:       64.0 in Accession #:    [id number]      Weight:       280.6 lb Date of Birth:  1965-10-24       BSA:          2.259 m Patient Age:    56 years        BP:           132/73 mmHg Patient Gender: F               HR:           82 bpm. Exam Location:  Inpatient  Procedure: 2D Echo, Cardiac Doppler and Color Doppler  Indications:    Chest pain  History:        Patient has no prior history of Echocardiogram examinations. CHF.  Sonographer:  Eduard Roux Referring Phys: [id number] Darlin Drop   Sonographer Comments: Patient is morbidly obese. Image acquisition challenging due to respiratory motion. IMPRESSIONS   1. Limited study due to poor echo windows. 2. Left ventricular ejection fraction, by estimation, is 55 to 60%. The left ventricle has normal function. Left ventricular endocardial border not optimally defined to evaluate regional wall motion. Left ventricular diastolic parameters are consistent with Grade I diastolic dysfunction (impaired relaxation). 3. Right ventricular systolic function is normal. The right ventricular size is normal. Tricuspid regurgitation signal is inadequate for assessing PA pressure. 4. The mitral valve is normal in structure. Trivial mitral valve regurgitation. No evidence of mitral stenosis. 5. The aortic valve was not well visualized. Aortic  valve regurgitation is not visualized. No aortic stenosis is present. 6. The inferior vena cava is dilated in size with <50% respiratory variability, suggesting right atrial pressure of 15 mmHg. 7. Cannot exclude a small PFO.  Comparison(s): No prior Echocardiogram.  FINDINGS Left Ventricle: Left ventricular ejection fraction, by estimation, is 55 to 60%. The left ventricle has normal function. Left ventricular endocardial border not optimally defined to evaluate regional wall motion. The left ventricular internal cavity size was normal in size. There is no left ventricular hypertrophy. Left ventricular diastolic parameters are consistent with Grade I diastolic dysfunction (impaired relaxation).  Right Ventricle: The right ventricular size is normal. No increase in right ventricular wall thickness. Right ventricular systolic function is normal. Tricuspid regurgitation signal is inadequate for assessing PA pressure.  Left Atrium: Left atrial size was normal in size.  Right Atrium: Right atrial size was normal in size.  Pericardium: There is no evidence of pericardial effusion.  Mitral Valve: The mitral valve is normal in structure. There is mild thickening of the mitral valve leaflet(s). Mild mitral annular calcification. Trivial mitral valve regurgitation. No evidence of mitral valve stenosis.  Tricuspid Valve: The tricuspid valve is normal in structure. Tricuspid valve regurgitation is trivial.  Aortic Valve: The aortic valve was not well visualized. Aortic valve regurgitation is not visualized. No aortic stenosis is present. Aortic valve peak gradient measures 14.1 mmHg.  Pulmonic Valve: The pulmonic valve was not well visualized. Pulmonic valve regurgitation is trivial.  Aorta: The aortic root and ascending aorta are structurally normal, with no evidence of dilitation.  Venous: The inferior vena cava is dilated in size with less than 50% respiratory variability, suggesting right atrial  pressure of 15 mmHg.  IAS/Shunts: Cannot exclude a small PFO.   LEFT VENTRICLE PLAX 2D LVIDd:         6.10 cm Diastology LVIDs:         5.10 cm LV e' medial:    6.90 cm/s LV PW:         0.90 cm LV E/e' medial:  13.5 LV IVS:        1.00 cm LV e' lateral:   9.14 cm/s LV E/e' lateral: 10.2   RIGHT VENTRICLE          IVC RV Basal diam:  3.40 cm  IVC diam: 2.30 cm RV Mid diam:    3.20 cm TAPSE (M-mode): 3.0 cm  LEFT ATRIUM           Index        RIGHT ATRIUM           Index LA diam:      3.50 cm 1.55 cm/m   RA Area:     21.40 cm LA Vol (A2C): 56.1 ml 24.83 ml/m  RA Volume:   62.50 ml  27.66 ml/m LA Vol (A4C): 57.7 ml 25.54 ml/m AORTIC VALVE               PULMONIC VALVE AV Vmax:      187.50 cm/s  PV Vmax:       1.32 m/s AV Peak Grad: 14.1 mmHg    PV Peak grad:  7.0 mmHg LVOT Vmax:    166.00 cm/s LVOT Vmean:   114.000 cm/s LVOT VTI:     0.352 m  AORTA Ao Asc diam: 3.30 cm  MITRAL VALVE MV Area (PHT): 4.06 cm    SHUNTS MV Decel Time: 187 msec    Systemic VTI: 0.35 m MV E velocity: 93.00 cm/s MV A velocity: 97.90 cm/s MV E/A ratio:  0.95  Laurance Flatten MD Electronically signed by Laurance Flatten MD Signature Date/Time: 12/02/2021/10:17:01 AM    Final                 Recent Labs: 08/24/2022: TSH 2.41 08/31/2022: ALT 15; BUN 11; Creatinine 0.87; Hemoglobin 14.8; Platelet Count 350; Potassium 3.9; Sodium 134  Recent Lipid Panel    Component Value Date/Time   CHOL 179 12/02/2021 0553   TRIG 63 12/02/2021 0553   HDL 40 (L) 12/02/2021 0553   CHOLHDL 4.5 12/02/2021 0553   VLDL 13 12/02/2021 0553   LDLCALC 126 (H) 12/02/2021 0553    Physical Exam:    VS:  LMP  (LMP Unknown)     Wt Readings from Last 3 Encounters:  03/24/23 283 lb (128.4 kg)  03/01/23 282 lb 9.6 oz (128.2 kg)  11/23/22 279 lb (126.6 kg)     GEN: *** Well nourished, well developed in no acute distress HEENT: Normal NECK: No JVD; No carotid bruits LYMPHATICS: No  lymphadenopathy CARDIAC: ***RRR, no murmurs, rubs, gallops RESPIRATORY:  Clear to auscultation without rales, wheezing or rhonchi  ABDOMEN: Soft, non-tender, non-distended MUSCULOSKELETAL:  No edema; No deformity  SKIN: Warm and dry NEUROLOGIC:  Alert and oriented x 3 PSYCHIATRIC:  Normal affect    Signed, Norman Herrlich, MD  06/06/2023 8:31 PM    [facility]

## 2023-06-07 ENCOUNTER — Ambulatory Visit: Payer: Medicaid Other | Attending: Cardiology | Admitting: Cardiology

## 2023-06-07 ENCOUNTER — Encounter: Payer: Self-pay | Admitting: Cardiology

## 2023-06-07 VITALS — BP 122/80 | HR 93 | Ht 64.0 in | Wt 280.2 lb

## 2023-06-07 DIAGNOSIS — R202 Paresthesia of skin: Secondary | ICD-10-CM | POA: Insufficient documentation

## 2023-06-07 DIAGNOSIS — M546 Pain in thoracic spine: Secondary | ICD-10-CM

## 2023-06-07 DIAGNOSIS — M6281 Muscle weakness (generalized): Secondary | ICD-10-CM

## 2023-06-07 DIAGNOSIS — R600 Localized edema: Secondary | ICD-10-CM | POA: Diagnosis not present

## 2023-06-07 DIAGNOSIS — G4733 Obstructive sleep apnea (adult) (pediatric): Secondary | ICD-10-CM

## 2023-06-07 HISTORY — DX: Paresthesia of skin: R20.2

## 2023-06-07 HISTORY — DX: Pain in thoracic spine: M54.6

## 2023-06-07 MED ORDER — FUROSEMIDE 40 MG PO TABS: 40.0000 mg | ORAL_TABLET | Freq: Every day | ORAL | 3 refills | Status: AC

## 2023-06-07 NOTE — Patient Instructions (Signed)
Medication Instructions:  Your physician has recommended you make the following change in your medication:   START: Furosemide 40 mg daily STOP: Rosuvastatin  *If you need a refill on your cardiac medications before your next appointment, please call your pharmacy*   Lab Work: Your physician recommends that you return for lab work in:   Labs today: BMP, Magnesium and CPK  If you have labs (blood work) drawn today and your tests are completely normal, you will receive your results only by: MyChart Message (if you have MyChart) OR A paper copy in the mail If you have any lab test that is abnormal or we need to change your treatment, we will call you to review the results.   Testing/Procedures: None   Follow-Up: At [facility], you and your health needs are our priority.  As part of our continuing mission to provide you with exceptional heart care, we have created designated Provider Care Teams.  These Care Teams include your primary Cardiologist (physician) and Advanced Practice Providers (APPs -  Physician Assistants and Nurse Practitioners) who all work together to provide you with the care you need, when you need it.  We recommend signing up for the patient portal called "MyChart".  Sign up information is provided on this After Visit Summary.  MyChart is used to connect with patients for Virtual Visits (Telemedicine).  Patients are able to view lab/test results, encounter notes, upcoming appointments, etc.  Non-urgent messages can be sent to your provider as well.   To learn more about what you can do with MyChart, go to [email or web address].    Your next appointment:   6 month(s)  Provider:   Norman Herrlich, MD    Other Instructions None

## 2023-06-13 ENCOUNTER — Telehealth: Payer: Medicaid Other | Admitting: Family Medicine

## 2023-06-13 DIAGNOSIS — N611 Abscess of the breast and nipple: Secondary | ICD-10-CM | POA: Diagnosis not present

## 2023-06-13 DIAGNOSIS — J019 Acute sinusitis, unspecified: Secondary | ICD-10-CM | POA: Diagnosis not present

## 2023-06-13 DIAGNOSIS — B9689 Other specified bacterial agents as the cause of diseases classified elsewhere: Secondary | ICD-10-CM | POA: Diagnosis not present

## 2023-06-13 MED ORDER — DOXYCYCLINE HYCLATE 100 MG PO TABS
100.0000 mg | ORAL_TABLET | Freq: Two times a day (BID) | ORAL | 0 refills | Status: AC
Start: 2023-06-13 — End: 2023-06-23

## 2023-06-13 NOTE — Patient Instructions (Signed)
Theresa Marquez, thank you for joining Freddy Finner, NP for today's virtual visit.  While this provider is not your primary care provider (PCP), if your PCP is located in our provider database this encounter information will be shared with them immediately following your visit.   A [facility] MyChart account gives you access to today's visit and all your visits, tests, and labs performed at [facility] " click here if you don't have a [facility] MyChart account or go to [email or web address]  Consent: (Patient) Theresa Marquez provided verbal consent for this virtual visit at the beginning of the encounter.  Current Medications:  Current Outpatient Medications:    doxycycline (VIBRA-TABS) 100 MG tablet, Take 1 tablet (100 mg total) by mouth 2 (two) times daily for 10 days., Disp: 20 tablet, Rfl: 0   albuterol (VENTOLIN HFA) 108 (90 Base) MCG/ACT inhaler, Inhale 2 puffs into the lungs every 6 (six) hours as needed for wheezing or shortness of breath., Disp: 8 g, Rfl: 0   Capsaicin-Menthol-Methyl Sal (CAPSAICIN-METHYL SAL-MENTHOL) 0.025-1-12 % CREA, Apply 1 Application topically 2 (two) times daily as needed (pain)., Disp: , Rfl:    folic acid (FOLVITE) 1 MG tablet, Take 1 tablet (1 mg total) by mouth daily., Disp: , Rfl:    furosemide (LASIX) 40 MG tablet, Take 1 tablet (40 mg total) by mouth daily., Disp: 90 tablet, Rfl: 3   gabapentin (NEURONTIN) 100 MG capsule, Take 1 capsule (100 mg total) by mouth 3 (three) times daily as needed., Disp: 90 capsule, Rfl: 2   hydrOXYzine (VISTARIL) 25 MG capsule, Take 1 capsule (25 mg total) by mouth every 8 (eight) hours as needed. (Patient taking differently: Take 25 mg by mouth every 8 (eight) hours as needed for anxiety or itching.), Disp: 90 capsule, Rfl: 0   ipratropium (ATROVENT) 0.06 % nasal spray, Place 2 sprays into both nostrils 2 (two) times daily., Disp: 12 mL, Rfl: 2   lidocaine-prilocaine (EMLA) cream, Apply 1 Application  topically as needed. (Patient taking differently: Apply 1 Application topically as needed (rash).), Disp: 30 g, Rfl: 0   naproxen (NAPROSYN) 500 MG tablet, Take 500 mg by mouth 2 (two) times daily., Disp: , Rfl:    spironolactone (ALDACTONE) 25 MG tablet, Take 1 tablet (25 mg total) by mouth daily., Disp: 90 tablet, Rfl: 3   Medications ordered in this encounter:  Meds ordered this encounter  Medications   doxycycline (VIBRA-TABS) 100 MG tablet    Sig: Take 1 tablet (100 mg total) by mouth 2 (two) times daily for 10 days.    Dispense:  20 tablet    Refill:  0    Supervising Provider:   Merrilee Jansky [[id number]]     *If you need refills on other medications prior to your next appointment, please contact your pharmacy*  Follow-Up: Call back or seek an in-person evaluation if the symptoms worsen or if the condition fails to improve as anticipated.  [facility] [phone]  Other Instructions  URI recommendations: - Increased rest - Increasing Fluids - Acetaminophen / ibuprofen as needed for fever/pain.  - Salt water gargling, chloraseptic spray and throat lozenges - Mucinex if mucus is present and increasing.  - Saline nasal spray if congestion or if nasal passages feel dry. - Humidifying the air.   Follow up with PCP on boils   If you have been instructed to have an in-person evaluation today at a local Urgent Care facility, please use the link  below. It will take you to a list of all of our available [facility], including address, phone number and hours of operation. Please do not delay care.  [facility]  If you or a family member do not have a primary care provider, use the link below to schedule a visit and establish care. When you choose a [facility] primary care physician or advanced practice provider, you gain a long-term partner in health. Find a Primary Care Provider  Learn more about [facility]'s in-office and virtual  care options:  - Get Care Now

## 2023-06-13 NOTE — Progress Notes (Signed)
Virtual Visit Consent   Theresa Marquez, you are scheduled for a virtual visit with a [facility] provider today. Just as with appointments in the office, your consent must be obtained to participate. Your consent will be active for this visit and any virtual visit you may have with one of our providers in the next 365 days. If you have a MyChart account, a copy of this consent can be sent to you electronically.  As this is a virtual visit, video technology does not allow for your provider to perform a traditional examination. This may limit your provider's ability to fully assess your condition. If your provider identifies any concerns that need to be evaluated in person or the need to arrange testing (such as labs, EKG, etc.), we will make arrangements to do so. Although advances in technology are sophisticated, we cannot ensure that it will always work on either your end or our end. If the connection with a video visit is poor, the visit may have to be switched to a telephone visit. With either a video or telephone visit, we are not always able to ensure that we have a secure connection.  By engaging in this virtual visit, you consent to the provision of healthcare and authorize for your insurance to be billed (if applicable) for the services provided during this visit. Depending on your insurance coverage, you may receive a charge related to this service.  I need to obtain your verbal consent now. Are you willing to proceed with your visit today? CARLETT FIRST has provided verbal consent on 06/13/2023 for a virtual visit (video or telephone). Freddy Finner, NP  Date: 06/13/2023 3:36 PM  Virtual Visit via Video Note   I, Freddy Finner, connected with  Theresa Marquez  ([id number], 1965-12-18) on 06/13/23 at  3:30 PM EST by a video-enabled telemedicine application and verified that I am speaking with the correct person using two identifiers.  Location: Patient: Virtual Visit Location Patient:  Home Provider: Virtual Visit Location Provider: Home Office   I discussed the limitations of evaluation and management by telemedicine and the availability of in person appointments. The patient expressed understanding and agreed to proceed.    History of Present Illness: Theresa Marquez is a 57 y.o. who identifies as a female who was assigned female at birth, and is being seen today for   Onset was 3 days that started with the tenderness and swelling in right side of neck, achy feverish- 100.3 and chills, coughing- more like a throat clearing  Associated symptoms are headache and sore throat  Modifying factors are Tylenol, advil, hot compresses  Denies chest pain, shortness of breath  No history of dental procedures or fillings on that side   Exposure to sick contacts- unknown, but has been in a doctor office several days ago  COVID test: no  Vaccines: not up to date   Additionally: Boil on right side of breast. She reports they have been popping up more recently and she has not taken anything for them, but this one is painful and not draining well. She had these when she was younger, has not had in many years. 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   OSA (obstructive sleep apnea) 12/14/2021   AKI (acute kidney injury) (HCC) 12/14/2021   Wheezing 12/14/2021   Rhinitis  11/30/2021   Obesity, morbid, BMI 40.0-49.9 (HCC) 11/10/2021   Tobacco abuse 11/10/2021   Bilateral lower extremity edema 11/10/2021   Recurrent sinus infections 11/10/2021   Family history of thyroid disease 10/05/2021   Neck swelling 10/05/2021   Allergy 08/09/2017   Fatigue 12/24/2014    Allergies:  Allergies  Allergen Reactions   Codeine Other (See Comments)    Hallucinations  Hallucinates    Prednisone Swelling   Medications:  Current Outpatient Medications:    albuterol (VENTOLIN HFA) 108 (90 Base) MCG/ACT inhaler, Inhale 2 puffs into the lungs every 6 (six) hours as needed for wheezing or shortness of breath., Disp: 8 g, Rfl: 0   Capsaicin-Menthol-Methyl Sal (CAPSAICIN-METHYL SAL-MENTHOL) 0.025-1-12 % CREA, Apply 1 Application topically 2 (two) times daily as needed (pain)., Disp: , Rfl:    folic acid (FOLVITE) 1 MG tablet, Take 1 tablet (1 mg total) by mouth daily., Disp: , Rfl:    furosemide (LASIX) 40 MG tablet, Take 1 tablet (40 mg total) by mouth daily., Disp: 90 tablet, Rfl: 3   gabapentin (NEURONTIN) 100 MG capsule, Take 1 capsule (100 mg total) by mouth 3 (three) times daily as needed., Disp: 90 capsule, Rfl: 2   hydrOXYzine (VISTARIL) 25 MG capsule, Take 1 capsule (25 mg total) by mouth every 8 (eight) hours as needed. (Patient taking differently: Take 25 mg by mouth every 8 (eight) hours as needed for anxiety or itching.), Disp: 90 capsule, Rfl: 0   ipratropium (ATROVENT) 0.06 % nasal spray, Place 2 sprays into both nostrils 2 (two) times daily., Disp: 12 mL, Rfl: 2   lidocaine-prilocaine (EMLA) cream, Apply 1 Application topically as needed. (Patient taking differently: Apply 1 Application topically as needed (rash).), Disp: 30 g, Rfl: 0   naproxen (NAPROSYN) 500 MG tablet, Take 500 mg by mouth 2 (two) times daily., Disp: , Rfl:    spironolactone (ALDACTONE) 25 MG tablet, Take 1 tablet (25 mg total) by mouth daily., Disp: 90 tablet, Rfl:  3  Observations/Objective: Patient is well-developed, well-nourished in no acute distress.  Resting comfortably  at home.  Head is normocephalic, atraumatic.  No labored breathing.  Speech is clear and coherent with logical content.  Patient is alert and oriented at baseline.    Assessment and Plan:  1. Acute bacterial sinusitis (Primary)  - doxycycline (VIBRA-TABS) 100 MG tablet; Take 1 tablet (100 mg total) by mouth 2 (two) times daily for 10 days.  Dispense: 20 tablet; Refill: 0  2. Boil, breast  - doxycycline (VIBRA-TABS) 100 MG tablet; Take 1 tablet (100 mg total) by mouth 2 (two) times daily for 10 days.  Dispense: 20 tablet; Refill: 0  -pt has acute sinus infection as well as boil on breast, noted to have had several boils in the last several weeks -treatment for coverage of boil- it is too soon to cover for URI, but this will overlap with some coverage   URI recommendations: - Increased rest - Increasing Fluids - Acetaminophen / ibuprofen as needed for fever/pain.  -  Salt water gargling, chloraseptic spray and throat lozenges - Mucinex if mucus is present and increasing.  - Saline nasal spray if congestion or if nasal passages feel dry. - Humidifying the air.   -keep boil area clean and dry -follow up if not improving or they continue to occur   Reviewed side effects, risks and benefits of medication.    Patient acknowledged agreement and understanding of the plan.   Past Medical, Surgical, Social History, Allergies, and Medications have been Reviewed.    Follow Up Instructions: I discussed the assessment and treatment plan with the patient. The patient was provided an opportunity to ask questions and all were answered. The patient agreed with the plan and demonstrated an understanding of the instructions.  A copy of instructions were sent to the patient via MyChart unless otherwise noted below.   The patient was advised to call back or seek an in-person  evaluation if the symptoms worsen or if the condition fails to improve as anticipated.    Freddy Finner, NP

## 2023-06-20 ENCOUNTER — Encounter: Payer: Medicaid Other | Admitting: Family Medicine

## 2023-07-06 ENCOUNTER — Encounter: Payer: Medicaid Other | Admitting: Family Medicine

## 2023-07-11 ENCOUNTER — Encounter: Payer: Medicaid Other | Admitting: Family Medicine

## 2023-07-14 ENCOUNTER — Telehealth: Payer: Self-pay | Admitting: Family Medicine

## 2023-07-14 ENCOUNTER — Ambulatory Visit: Payer: Medicaid Other | Admitting: Family Medicine

## 2023-07-14 ENCOUNTER — Encounter: Payer: Self-pay | Admitting: Family Medicine

## 2023-07-14 ENCOUNTER — Telehealth: Payer: Self-pay

## 2023-07-14 ENCOUNTER — Ambulatory Visit: Payer: Self-pay | Admitting: Family Medicine

## 2023-07-14 VITALS — BP 122/80 | HR 90 | Temp 97.0°F | Ht 64.0 in | Wt 273.4 lb

## 2023-07-14 DIAGNOSIS — R591 Generalized enlarged lymph nodes: Secondary | ICD-10-CM | POA: Diagnosis not present

## 2023-07-14 DIAGNOSIS — E66813 Obesity, class 3: Secondary | ICD-10-CM

## 2023-07-14 DIAGNOSIS — Z720 Tobacco use: Secondary | ICD-10-CM

## 2023-07-14 DIAGNOSIS — R062 Wheezing: Secondary | ICD-10-CM

## 2023-07-14 DIAGNOSIS — M791 Myalgia, unspecified site: Secondary | ICD-10-CM

## 2023-07-14 DIAGNOSIS — Z0001 Encounter for general adult medical examination with abnormal findings: Secondary | ICD-10-CM | POA: Diagnosis not present

## 2023-07-14 DIAGNOSIS — R5382 Chronic fatigue, unspecified: Secondary | ICD-10-CM

## 2023-07-14 DIAGNOSIS — M5442 Lumbago with sciatica, left side: Secondary | ICD-10-CM | POA: Diagnosis not present

## 2023-07-14 DIAGNOSIS — R195 Other fecal abnormalities: Secondary | ICD-10-CM

## 2023-07-14 DIAGNOSIS — D751 Secondary polycythemia: Secondary | ICD-10-CM

## 2023-07-14 DIAGNOSIS — R221 Localized swelling, mass and lump, neck: Secondary | ICD-10-CM

## 2023-07-14 DIAGNOSIS — Z6841 Body Mass Index (BMI) 40.0 and over, adult: Secondary | ICD-10-CM | POA: Diagnosis not present

## 2023-07-14 DIAGNOSIS — G8929 Other chronic pain: Secondary | ICD-10-CM

## 2023-07-14 DIAGNOSIS — R600 Localized edema: Secondary | ICD-10-CM | POA: Diagnosis not present

## 2023-07-14 DIAGNOSIS — Z1231 Encounter for screening mammogram for malignant neoplasm of breast: Secondary | ICD-10-CM

## 2023-07-14 DIAGNOSIS — R7303 Prediabetes: Secondary | ICD-10-CM | POA: Diagnosis not present

## 2023-07-14 DIAGNOSIS — R140 Abdominal distension (gaseous): Secondary | ICD-10-CM | POA: Diagnosis not present

## 2023-07-14 DIAGNOSIS — E782 Mixed hyperlipidemia: Secondary | ICD-10-CM | POA: Diagnosis not present

## 2023-07-14 DIAGNOSIS — R8281 Pyuria: Secondary | ICD-10-CM

## 2023-07-14 DIAGNOSIS — T466X5A Adverse effect of antihyperlipidemic and antiarteriosclerotic drugs, initial encounter: Secondary | ICD-10-CM

## 2023-07-14 DIAGNOSIS — R091 Pleurisy: Secondary | ICD-10-CM

## 2023-07-14 DIAGNOSIS — Z0000 Encounter for general adult medical examination without abnormal findings: Secondary | ICD-10-CM

## 2023-07-14 DIAGNOSIS — G4733 Obstructive sleep apnea (adult) (pediatric): Secondary | ICD-10-CM

## 2023-07-14 DIAGNOSIS — F321 Major depressive disorder, single episode, moderate: Secondary | ICD-10-CM

## 2023-07-14 DIAGNOSIS — E119 Type 2 diabetes mellitus without complications: Secondary | ICD-10-CM

## 2023-07-14 DIAGNOSIS — Z124 Encounter for screening for malignant neoplasm of cervix: Secondary | ICD-10-CM

## 2023-07-14 HISTORY — DX: Myalgia, unspecified site: M79.10

## 2023-07-14 LAB — CBC WITH DIFFERENTIAL/PLATELET
Basophils Absolute: 0.3 10*3/uL — ABNORMAL HIGH (ref 0.0–0.1)
Basophils Relative: 1.3 % (ref 0.0–3.0)
Eosinophils Absolute: 0.2 10*3/uL (ref 0.0–0.7)
Eosinophils Relative: 0.9 % (ref 0.0–5.0)
HCT: 46.2 % — ABNORMAL HIGH (ref 36.0–46.0)
Hemoglobin: 14.9 g/dL (ref 12.0–15.0)
Lymphocytes Relative: 19.4 % (ref 12.0–46.0)
Lymphs Abs: 3.7 10*3/uL (ref 0.7–4.0)
MCHC: 32.3 g/dL (ref 30.0–36.0)
MCV: 92.1 fL (ref 78.0–100.0)
Monocytes Absolute: 1 10*3/uL (ref 0.1–1.0)
Monocytes Relative: 5 % (ref 3.0–12.0)
Neutro Abs: 14.1 10*3/uL — ABNORMAL HIGH (ref 1.4–7.7)
Neutrophils Relative %: 73.4 % (ref 43.0–77.0)
Platelets: 368 10*3/uL (ref 150.0–400.0)
RBC: 5.02 Mil/uL (ref 3.87–5.11)
RDW: 15.8 % — ABNORMAL HIGH (ref 11.5–15.5)
WBC: 19.2 10*3/uL (ref 4.0–10.5)

## 2023-07-14 LAB — URINALYSIS, ROUTINE W REFLEX MICROSCOPIC
Bilirubin Urine: NEGATIVE
Hgb urine dipstick: NEGATIVE
Nitrite: NEGATIVE
Specific Gravity, Urine: 1.02 (ref 1.000–1.030)
Urine Glucose: NEGATIVE
Urobilinogen, UA: 0.2 (ref 0.0–1.0)
pH: 6.5 (ref 5.0–8.0)

## 2023-07-14 LAB — COMPREHENSIVE METABOLIC PANEL
ALT: 18 U/L (ref 0–35)
AST: 15 U/L (ref 0–37)
Albumin: 3.7 g/dL (ref 3.5–5.2)
Alkaline Phosphatase: 108 U/L (ref 39–117)
BUN: 7 mg/dL (ref 6–23)
CO2: 29 meq/L (ref 19–32)
Calcium: 9.1 mg/dL (ref 8.4–10.5)
Chloride: 100 meq/L (ref 96–112)
Creatinine, Ser: 0.7 mg/dL (ref 0.40–1.20)
GFR: 95.68 mL/min (ref >60.00)
Glucose, Bld: 89 mg/dL (ref 70–99)
Potassium: 3.6 meq/L (ref 3.5–5.1)
Sodium: 139 meq/L (ref 135–145)
Total Bilirubin: 0.4 mg/dL (ref 0.2–1.2)
Total Protein: 6.9 g/dL (ref 6.0–8.3)

## 2023-07-14 LAB — LIPID PANEL
Cholesterol: 223 mg/dL — ABNORMAL HIGH (ref 0–200)
HDL: 32.5 mg/dL — ABNORMAL LOW (ref >39.00)
LDL Cholesterol: 148 mg/dL — ABNORMAL HIGH (ref 0–99)
NonHDL: 190.01
Total CHOL/HDL Ratio: 7
Triglycerides: 208 mg/dL — ABNORMAL HIGH (ref 0.0–149.0)
VLDL: 41.6 mg/dL — ABNORMAL HIGH (ref 0.0–40.0)

## 2023-07-14 LAB — TSH: TSH: 2.07 u[IU]/mL (ref 0.35–5.50)

## 2023-07-14 LAB — MICROALBUMIN / CREATININE URINE RATIO
Creatinine,U: 267.8 mg/dL
Microalb Creat Ratio: 1.4 mg/g (ref 0.0–30.0)
Microalb, Ur: 3.9 mg/dL — ABNORMAL HIGH (ref 0.0–1.9)

## 2023-07-14 LAB — HEMOGLOBIN A1C: Hgb A1c MFr Bld: 6.9 % — ABNORMAL HIGH (ref 4.6–6.5)

## 2023-07-14 MED ORDER — SEMAGLUTIDE-WEIGHT MANAGEMENT 0.25 MG/0.5ML ~~LOC~~ SOAJ
0.2500 mg | SUBCUTANEOUS | 0 refills | Status: DC
Start: 2023-07-14 — End: 2023-07-18

## 2023-07-14 MED ORDER — LEVALBUTEROL TARTRATE 45 MCG/ACT IN AERO: 1.0000 | INHALATION_SPRAY | Freq: Four times a day (QID) | RESPIRATORY_TRACT | 0 refills | Status: DC | PRN

## 2023-07-14 MED ORDER — XOPENEX HFA 45 MCG/ACT IN AERO
1.0000 | INHALATION_SPRAY | Freq: Four times a day (QID) | RESPIRATORY_TRACT | 0 refills | Status: DC | PRN
Start: 2023-07-14 — End: 2023-07-14

## 2023-07-14 MED ORDER — IBUPROFEN 800 MG PO TABS: 800.0000 mg | ORAL_TABLET | Freq: Three times a day (TID) | ORAL | 0 refills | Status: DC | PRN

## 2023-07-14 MED ORDER — ESOMEPRAZOLE MAGNESIUM 40 MG PO CPDR
40.0000 mg | DELAYED_RELEASE_CAPSULE | Freq: Every day | ORAL | 3 refills | Status: DC
Start: 2023-07-14 — End: 2023-12-20

## 2023-07-14 NOTE — Telephone Encounter (Signed)
Copied from CRM [id number]. Topic: Clinical - Medication Refill >> Jul 14, 2023  3:46 PM Benetta Spar A wrote: Most Recent Primary Care Visit:  Provider: Garnette Gunner  Department: [facility]  Visit Type: HOSPITAL FU  Date: 03/01/2023  Medication: Semaglutide-Weight Management 0.25 MG/0.5ML SOAJ Patient had appointment today at office. Pt called pharmacy and was informed she needs a Prior Authorization for medication  Has the patient contacted their pharmacy? Yes (Agent: If no, request that the patient contact the pharmacy for the refill. If patient does not wish to contact the pharmacy document the reason why and proceed with request.) (Agent: If yes, when and what did the pharmacy advise?)  Is this the correct pharmacy for this prescription? Yes If no, delete pharmacy and type the correct one.  This is the patient's preferred pharmacy:  [facility] - [place], [place] - [address] [place] [address] Phone: [phone] Fax: [phone]  [facility] [address] [place] [address] Phone: [phone] Fax: [phone]   Has the prescription been filled recently? No  Is the patient out of the medication? Yes  Has the patient been seen for an appointment in the last year OR does the patient have an upcoming appointment? Yes  Can we respond through MyChart? No  Agent: Please be advised that Rx refills may take up to 3 business days. We ask that you follow-up with your pharmacy.

## 2023-07-14 NOTE — Telephone Encounter (Signed)
Solon Palm, CMA to Theresa Marquez A "Theresa Marquez"    07/14/23  5:02 PM Pt called regrading critical lab of WBC 19.2. Pt was advised by Dr. Janee Morn to come back into office if Upper Respiratory symptoms become worse and that there will be other lab results to follow. Pt understood.

## 2023-07-14 NOTE — Progress Notes (Addendum)
Assessment  Assessment/Plan:  Total time spent caring for the patient today was 125 minutes. This includes time spent before the visit reviewing the chart, time spent during the visit including counseling on documented disease conditions, and time spent after the visit on documentation, etc.  Problem List Items Addressed This Visit       Respiratory   OSA (obstructive sleep apnea)   Relevant Orders   Ambulatory referral to Sleep Studies     Nervous and Auditory   Chronic midline low back pain with left-sided sciatica   Persistent.  Improved with ibuprofen and her milligrams 3 times daily as needed  Plan: Consider referral to pain management specialist. Discuss non-pharmacological pain relief methods. Refill ibuprofen       Relevant Medications   Semaglutide-Weight Management 0.25 MG/0.5ML SOAJ   ibuprofen (ADVIL) 800 MG tablet     Immune and Lymphatic   Lymphadenopathy   Enlarged, tender lymph nodes in cervical and axillary areas. Partial improvement with previous doxycycline course.  Plan: Order CT scan of neck and chest to evaluate lymphadenopathy. Consider follow-up with hematology/oncology pending imaging results. Monitor for signs of infection or malignancy.      Relevant Orders   CT Soft Tissue Neck W Contrast     Other   Fatigue   Patient experiences worsening severe fatigue and weakness impacting daily activities, including difficulty brushing hair and cooking. Sleeps excessively (up to 23 hours/day) without feeling rested. Elevated PHQ concerning for possible depression.  Concerned that fatigue might be paraneoplastic syndrome versus underlying malignancy.  Has seen hematology in the past year and was considered to be low risk for leukemia/lymphoma.  Plan: Refer to sleep medicine to evaluate obstructive sleep apnea. Order lab work: CBC, CMP, TSH, vitamin D, vitamin B12, iron studies, ESR. Encourage balanced diet and gentle exercise as tolerated. Follow up  in 2 weeks to review lab and sleep study results. Discuss mental health assessment at next appointment      Relevant Orders   AMB Referral [facility]BCI Care Management   Neck swelling   Tobacco abuse   Recommend cessation and counseled on harms of smoking      Bilateral lower extremity edema   Stable. Plan: Continue Spironolactone 25 MG tablet daily. Continue Furosemide 40 MG daily Monitor electrolytes and renal function.      Wheezing   Not well-controlled. Plan: Trial levalbuterol inhaler as needed. If no improvement, consider adding inhaled corticosteroid for maintenance therapy (e.g., fluticasone 110 mcg, two puffs BID). Encourage smoking cessation and provided counseling and resources. Order chest CT to evaluate for other pulmonary pathology. Referral to pulmonology to assess for COPD. Follow up in 2 weeks to assess assess breathing Return sooner if symptoms worsen.      Relevant Medications   levalbuterol (XOPENEX HFA) 45 MCG/ACT inhaler   Other Relevant Orders   Ambulatory referral to Pulmonology   Myalgia due to statin   Positive colorectal cancer screening using Cologuard test   Expedite referral to gastroenterology for colonoscopy. Educate patient on importance of prompt evaluation. Patient reach out to clinic if she has not heard about scheduling within 1 week      Relevant Orders   Ambulatory referral to Gastroenterology   Vitamin D 1,25 dihydroxy   CBC with Differential/Platelet (Completed)   Class 3 severe obesity due to excess calories with serious comorbidity and body mass index (BMI) of 45.0 to 49.9 in adult [facility])   Worsening severe obesity potentially contributing to fatigue, sleep apnea, and  other symptoms.  Plan:  Initiate semaglutide [facility]) therapy as per cardiologist's recommendation; start at 0.25 mg subcutaneously once weekly. Provide education on medication administration and possible side effects. Encourage nutritional counseling and physical  activity as tolerated. Monitor weight and response to medication. Follow up in 4 weeks to assess progress.      Relevant Medications   Semaglutide-Weight Management 0.25 MG/0.5ML SOAJ   Other Relevant Orders   TSH (Completed)   Vitamin D 1,25 dihydroxy   Comprehensive metabolic panel (Completed)   Amb ref to Medical Nutrition Therapy-MNT   Abdominal bloating   Reports constant nausea, bloating, early satiety after minimal food intake. No abdominal pain reported.  Plan: Initiate esomeprazole 40 mg orally once daily. Test for H. pylori infection. Order abdominal ultrasound or CT abdomen and pelvis. Expedite gastroenterology referral. Follow up in 2 weeks to assess response to therapy and review test results.       Relevant Medications   esomeprazole (NEXIUM) 40 MG capsule   Other Relevant Orders   Ambulatory referral to Gastroenterology   H. pylori breath test   CT ABDOMEN PELVIS WO CONTRAST   Depression, major, single episode, moderate (HCC)   Elevated PHQ-9 and expressed emotional distress.  Plan: Provide empathetic support and counseling. Consider referral to behavioral health services  Monitor mood and discuss at next follow-up      Other Visit Diagnoses       Encounter for well adult exam without abnormal findings    -  Primary     Mixed hyperlipidemia       Relevant Orders   Lipid panel (Completed)   Vitamin D 1,25 dihydroxy     Prediabetes       Relevant Orders   Hemoglobin A1c (Completed)   Microalbumin / creatinine urine ratio (Completed)   Urinalysis, Routine w reflex microscopic (Completed)     Screening for cervical cancer       Relevant Orders   Ambulatory referral to Obstetrics / Gynecology     Screening mammogram for breast cancer       Relevant Orders   MM 3D SCREENING MAMMOGRAM BILATERAL BREAST     Pleurisy       Relevant Orders   CT CHEST WO CONTRAST     Pyuria       Relevant Orders   Urine Culture     Erythrocytosis       Relevant  Orders   CBC w/Diff       Medications Discontinued During This Encounter  Medication Reason   gabapentin (NEURONTIN) 100 MG capsule Discontinued by provider   albuterol (VENTOLIN HFA) 108 (90 Base) MCG/ACT inhaler Reorder   levalbuterol (XOPENEX HFA) 45 MCG/ACT inhaler    ibuprofen (ADVIL) 800 MG tablet Reorder   naproxen (NAPROSYN) 500 MG tablet     Patient Counseling(The following topics were reviewed and/or handout was given):  -Nutrition: Stressed importance of moderation in sodium/caffeine intake, saturated fat and cholesterol, caloric balance, sufficient intake of fresh fruits, vegetables, and fiber.  -Stressed the importance of regular exercise.   -Substance Abuse: Discussed cessation/primary prevention of tobacco, alcohol, or other drug use; driving or other dangerous activities under the influence; availability of treatment for abuse.   -Injury prevention: Discussed safety belts, safety helmets, smoke detector, smoking near bedding or upholstery.   -Sexuality: Discussed sexually transmitted diseases, partner selection, use of condoms, avoidance of unintended pregnancy and contraceptive alternatives.   -Dental health: Discussed importance of regular tooth brushing, flossing, and dental visits.  -  Health maintenance and immunizations reviewed. Please refer to Health maintenance section.  Return to care in 1 year for next preventative visit.        Subjective:  Chief complaint Encounter date: 07/14/2023  Chief Complaint  Patient presents with   Annual Exam   Encounter Date:  Chief Complaint:  Patient presents for a physical examination with complaints of fatigue, weakness, generalized swelling, lymphadenopathy, and requests a refill of albuterol inhaler.  History of Present Illness:  Patient reports worsening asthma symptoms requiring increased use of albuterol inhaler (2-3 times weekly). Experiences sudden shortness of breath, particularly during colder months.  Continues to smoke but is attempting to reduce usage.  Expresses severe fatigue and weakness impacting daily activities, such as difficulty brushing hair and cooking, requiring frequent breaks. Sleeps excessively (up to 23 hours per day) without feeling rested. Reports generalized swelling in hands, feet, legs, neck, and axillary regions. Previous telehealth visit resulted in a prescription of doxycycline for swollen glands, with partial improvement.  Positive Cologuard test received, but patient faces challenges scheduling a colonoscopy due to providers not accepting new patients. Experiences constant nausea, bloating, and early satiety after minimal food intake. Denies abdominal pain, diarrhea, or blood in stool. Occasional fluttery chest pains and intermittent cough during winter months.  Review of Systems:  Constitutional: Fatigue, weakness, excessive sleepiness. Eyes: Runny eyes. ENT: Dry mouth. Cardiovascular: Occasional fluttery chest pains; denies chest pain at rest. Respiratory: Shortness of breath, intermittent cough; no current wheezing. Gastrointestinal: Nausea, bloating, early satiety; denies abdominal pain, diarrhea, or hematochezia. Genitourinary: No complaints. Musculoskeletal: Generalized pain, difficulty with mobility; swelling in extremities. Neurological: Dizziness. Psychiatric: Frustration with medical care; feelings of helplessness. Integumentary: Swelling in hands, feet, legs. Lymphatic: Enlarged lymph nodes in neck and axillary regions. Endocrine: No polyuria or polydipsia. Hematologic: No bruising or bleeding tendencies. Allergic/Immunologic: No known allergies reported. Copy Remainder of ROS negative.  Lifestyle:  Exercise: Limited due to fatigue and weakness.  Occupation: Unemployed due to fatigue. 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     07/14/2023    1:43 PM 12/08/2022    2:27 PM 11/30/2021   11:16 AM  11/10/2021    2:21 PM  Depression screen PHQ 2/9  Decreased Interest 1 0 0 0  Down, Depressed, Hopeless 1 0 0 0  PHQ - 2 Score 2 0 0 0  Altered sleeping 3 3    Tired, decreased energy 3 3    Change in appetite 3 0    Feeling bad or failure about yourself  1 0    Trouble concentrating 0 0    Moving slowly or fidgety/restless 0 0    Suicidal thoughts 0 0    PHQ-9 Score 12 6    Difficult doing work/chores Extremely dIfficult Not difficult at all         07/14/2023    1:44 PM 12/08/2022    2:28 PM  GAD 7 : Generalized Anxiety Score  Nervous, Anxious, on Edge 1 0  Control/stop worrying 0 0  Worry too much - different things 0 0  Trouble relaxing 0 0  Restless 0 0  Easily annoyed or irritable 1 0  Afraid - awful might happen 0 0  Total GAD 7 Score 2 0  Anxiety Difficulty Somewhat difficult Not difficult at all    Health Maintenance Due  Topic Date Due   Pneumococcal Vaccine 31-58 Years old (1 of 2 - PCV) Never done   Cervical Cancer  Screening (HPV/Pap Cotest)  Never done   MAMMOGRAM  Never done   Zoster Vaccines- Shingrix (2 of 2) 01/25/2022      PMH:  The following were reviewed and entered/updated in epic: Past Medical History:  Diagnosis Date   Allergy    Endometrial polyp 08/09/2017   Formatting of this note might be different from the original. S/p hysteroscopy with D&C 10/2017 with Dr Elmore Guise, was benign    Patient Active Problem List   Diagnosis Date Noted   Lymphadenopathy 07/15/2023   Positive colorectal cancer screening using Cologuard test 07/15/2023   Class 3 severe obesity due to excess calories with serious comorbidity and body mass index (BMI) of 45.0 to 49.9 in adult (HCC) 07/15/2023   Abdominal bloating 07/15/2023   Depression, major, single episode, moderate (HCC) 07/15/2023   Myalgia due to statin 07/14/2023   Pain in thoracic spine 06/07/2023   Paresthesia 06/07/2023   Sedimentation rate elevation 03/24/2023   Syncope 03/01/2023   Frequent falls  03/01/2023   Lumbar foraminal stenosis 03/01/2023   Small vessel disease (HCC) 03/01/2023   Closed fracture of tooth 03/01/2023   Neuralgia 12/10/2022   Rash of both hands 12/10/2022   Weakness of extremity 11/14/2022   Seizure-like activity (HCC) 11/14/2022   Cervicalgia 08/24/2022   Sleep disturbance 08/24/2022   Pruritic dermatitis 08/24/2022   Chronic midline low back pain with left-sided sciatica 02/25/2022   OSA (obstructive sleep apnea) 12/14/2021   AKI (acute kidney injury) (HCC) 12/14/2021   Wheezing 12/14/2021   Rhinitis 11/30/2021   Obesity, morbid, BMI 40.0-49.9 (HCC) 11/10/2021   Tobacco abuse 11/10/2021   Bilateral lower extremity edema 11/10/2021   Recurrent sinus infections 11/10/2021   Family history of thyroid disease 10/05/2021   Neck swelling 10/05/2021   Allergy 08/09/2017   Fatigue 12/24/2014    Past Surgical History:  Procedure Laterality Date   DG GALL BLADDER Right     Family History  Problem Relation Age of Onset   Kidney disease Mother    Cancer Father    Cerebral palsy Daughter     Medications- reviewed and updated Outpatient Medications Prior to Visit  Medication Sig Dispense Refill   Capsaicin-Menthol-Methyl Sal (CAPSAICIN-METHYL SAL-MENTHOL) 0.025-1-12 % CREA Apply 1 Application topically 2 (two) times daily as needed (pain).     furosemide (LASIX) 40 MG tablet Take 1 tablet (40 mg total) by mouth daily. 90 tablet 3   ipratropium (ATROVENT) 0.06 % nasal spray Place 2 sprays into both nostrils 2 (two) times daily. 12 mL 2   spironolactone (ALDACTONE) 25 MG tablet Take 1 tablet (25 mg total) by mouth daily. 90 tablet 3   albuterol (VENTOLIN HFA) 108 (90 Base) MCG/ACT inhaler Inhale 2 puffs into the lungs every 6 (six) hours as needed for wheezing or shortness of breath. 8 g 0   ibuprofen (ADVIL) 800 MG tablet Take 800 mg by mouth every 8 (eight) hours as needed for moderate pain (pain score 4-6).     folic acid (FOLVITE) 1 MG tablet Take 1  tablet (1 mg total) by mouth daily. (Patient not taking: Reported on 07/14/2023)     hydrOXYzine (VISTARIL) 25 MG capsule Take 1 capsule (25 mg total) by mouth every 8 (eight) hours as needed. (Patient not taking: Reported on 07/14/2023) 90 capsule 0   lidocaine-prilocaine (EMLA) cream Apply 1 Application topically as needed. (Patient not taking: Reported on 07/14/2023) 30 g 0   gabapentin (NEURONTIN) 100 MG capsule Take 1 capsule (100 mg total)  by mouth 3 (three) times daily as needed. 90 capsule 2   naproxen (NAPROSYN) 500 MG tablet Take 500 mg by mouth 2 (two) times daily. (Patient not taking: Reported on 07/14/2023)     No facility-administered medications prior to visit.    Allergies  Allergen Reactions   Codeine Other (See Comments)    Hallucinations  Hallucinates    Prednisone Swelling    Social History   Socioeconomic History   Marital status: Divorced    Spouse name: Not on file   Number of children: 3   Years of education: Not on file   Highest education level: Associate degree: academic program  Occupational History   Occupation: [employer]    Comment: Full-Time.  Tobacco Use   Smoking status: Every Day    Current packs/day: 0.25    Average packs/day: 0.3 packs/day for 53.7 years (13.4 ttl pk-yrs)    Types: Cigarettes    Start date: 10/26/1969   Smokeless tobacco: Never   Tobacco comments:    Smoking cessation  Vaping Use   Vaping status: Never Used  Substance and Sexual Activity   Alcohol use: Not Currently    Comment: socially   Drug use: Never   Sexual activity: Not Currently  Other Topics Concern   Not on file  Social History Narrative   Right Handed    Lives in a one story home with a basement   Social Drivers of Health   Financial Resource Strain: High Risk (06/13/2023)   Overall Financial Resource Strain (CARDIA)    Difficulty of Paying Living Expenses: Very hard  Food Insecurity: No Food Insecurity (06/13/2023)   Hunger Vital Sign    Worried About  Running Out of Food in the Last Year: Never true    Ran Out of Food in the Last Year: Never true  Transportation Needs: Unmet Transportation Needs (06/13/2023)   PRAPARE - Administrator, Civil Service (Medical): Yes    Lack of Transportation (Non-Medical): No  Physical Activity: Unknown (06/13/2023)   Exercise Vital Sign    Days of Exercise per Week: 0 days    Minutes of Exercise per Session: Not on file  Stress: No Stress Concern Present (06/13/2023)   [employer] of Occupational Health - Occupational Stress Questionnaire    Feeling of Stress : Only a little  Social Connections: Moderately Isolated (06/13/2023)   Social Connection and Isolation Panel [NHANES]    Frequency of Communication with Friends and Family: Three times a week    Frequency of Social Gatherings with Friends and Family: Never    Attends Religious Services: 1 to 4 times per year    Active Member of [employer] or Organizations: No    Attends Engineer, structural: Not on file    Marital Status: Divorced           Objective:  Physical Exam: BP 122/80 (BP Location: Right Arm, Patient Position: Sitting, Cuff Size: Large)   Pulse 90   Temp (!) 97 F (36.1 C) (Temporal)   Ht 5\' 4"  (1.626 m)   Wt 273 lb 6.4 oz (124 kg)   LMP  (LMP Unknown)   SpO2 99%   BMI 46.93 kg/m   Body mass index is 46.93 kg/m. Wt Readings from Last 3 Encounters:  07/14/23 273 lb 6.4 oz (124 kg)  06/07/23 280 lb 3.2 oz (127.1 kg)  03/24/23 283 lb (128.4 kg)    Physical Exam Constitutional:      General: She is  not in acute distress.    Appearance: Normal appearance. She is not ill-appearing or toxic-appearing.  HENT:     Head: Normocephalic and atraumatic.     Right Ear: Hearing, tympanic membrane, ear canal and external ear normal. There is no impacted cerumen.     Left Ear: Hearing, tympanic membrane, ear canal and external ear normal. There is no impacted cerumen.     Nose: Nose normal. No congestion.      Mouth/Throat:     Lips: No lesions.     Mouth: Mucous membranes are moist.     Pharynx: Oropharynx is clear. No oropharyngeal exudate.  Eyes:     General: No scleral icterus.       Right eye: No discharge.        Left eye: No discharge.     Conjunctiva/sclera: Conjunctivae normal.     Pupils: Pupils are equal, round, and reactive to light.  Neck:     Thyroid: No thyroid mass, thyromegaly or thyroid tenderness.  Cardiovascular:     Rate and Rhythm: Normal rate and regular rhythm.     Pulses: Normal pulses.     Heart sounds: Normal heart sounds.  Pulmonary:     Effort: Pulmonary effort is normal. No respiratory distress.     Breath sounds: Normal breath sounds.  Abdominal:     General: Abdomen is flat. Bowel sounds are normal.     Palpations: Abdomen is soft.  Musculoskeletal:        General: Normal range of motion.     Cervical back: Normal range of motion.     Right lower leg: 1+ Pitting Edema present.     Left lower leg: 1+ Pitting Edema present.  Lymphadenopathy:     Cervical: Cervical adenopathy present.     Upper Body:     Right upper body: Axillary adenopathy present.     Left upper body: Axillary adenopathy present.  Skin:    General: Skin is warm and dry.     Findings: No rash.  Neurological:     General: No focal deficit present.     Mental Status: She is oriented to person, place, and time. Mental status is at baseline. She is lethargic.     Cranial Nerves: Cranial nerves 2-12 are intact.     Motor: Motor function is intact.     Coordination: Coordination is intact.     Gait: Gait is intact.     Deep Tendon Reflexes:     Reflex Scores:      Patellar reflexes are 2+ on the right side and 2+ on the left side. Psychiatric:        Mood and Affect: Mood normal.        Behavior: Behavior normal.        Thought Content: Thought content normal.        Judgment: Judgment normal.         At today's visit, we discussed treatment options, associated risk and  benefits, and engage in counseling as needed.  Additionally the following were reviewed: Past medical records, past medical and surgical history, family and social background, as well as relevant laboratory results, imaging findings, and specialty notes, where applicable.  This message was generated using dictation software, and as a result, it may contain unintentional typos or errors.  Nevertheless, extensive effort was made to accurately convey at the pertinent aspects of the patient visit.    There may have been are other unrelated non-urgent complaints, but  due to the busy schedule and the amount of time already spent with her, time does not permit to address these issues at today's visit. Another appointment may have or has been requested to review these additional issues.     Thomes Dinning, MD, MS

## 2023-07-14 NOTE — Telephone Encounter (Signed)
Per Dr. Janee Morn urine culture to be added on. Form completed and faxed to [facility] lab.

## 2023-07-14 NOTE — Telephone Encounter (Signed)
Completed.

## 2023-07-14 NOTE — Patient Instructions (Addendum)
For positive Cologuard, we are referring to gastroenterology.  For cervical cancer screening, we are referring to OB/GYN.  For breast cancer screening, we are ordering a mammogram.  For pleurisy and breathing issues, we have refilled the inhaler and ordering CT of your chest.  For abdominal bloating, try the esomeprazole as prescribed.  We are checking for an infection called H. pylori.  Please also discuss this with gastroenterology.  We are ordering a CT of her abdomen pelvis.  We are ordering routine laboratory assessment including cholesterol, blood sugar, thyroid, urinalysis, kidney assessment, liver assessment, vitamin D assessment, and blood counts.  We will contact you with these results.  For weight management, start semaglutide 0.25 weekly injections.  Follow-up in 4 weeks.  For sleep apnea, we have ordered a referral for sleep study.

## 2023-07-14 NOTE — Telephone Encounter (Signed)
Copied from CRM [id number]. Topic: Clinical - Lab/Test Results >> Jul 14, 2023  4:47 PM Sim Boast F wrote: Reason for CRM: call regarding critical lab   Chief Complaint: Critical Lab Result Disposition: [] ED /[] Urgent Care (no appt availability in office) / [] Appointment(In office/virtual)/ []  [facility]/ [] Home Care/ [] Refused Recommended Disposition /[] [facility]/ [x]  Follow-up with PCP Additional Notes:   [facility], Critical WBC 19.2 [phone], Main Lab Number  Results read back and verified.   Contacted [facility] for relaying of critical lab result. Spoke with Wilkie Aye, results given to PCP office.    Reason for Disposition  Lab or radiology calling with CRITICAL test results    Critical WBC 19.2  Answer Assessment - Initial Assessment Questions 1. REASON FOR CALL or QUESTION: "What is your reason for calling today?" or "How can I best help you?" or "What question do you have that I can help answer?"    Critical Lab Result  2. CALLER: Document the source of call. (e.g., laboratory, patient).     [facility], [phone]  Protocols used: PCP Call - No Triage-A-AH

## 2023-07-15 ENCOUNTER — Ambulatory Visit: Payer: Medicaid Other

## 2023-07-15 DIAGNOSIS — R140 Abdominal distension (gaseous): Secondary | ICD-10-CM

## 2023-07-15 DIAGNOSIS — R195 Other fecal abnormalities: Secondary | ICD-10-CM | POA: Insufficient documentation

## 2023-07-15 DIAGNOSIS — R8281 Pyuria: Secondary | ICD-10-CM | POA: Diagnosis not present

## 2023-07-15 DIAGNOSIS — F321 Major depressive disorder, single episode, moderate: Secondary | ICD-10-CM

## 2023-07-15 DIAGNOSIS — E66813 Obesity, class 3: Secondary | ICD-10-CM

## 2023-07-15 DIAGNOSIS — R591 Generalized enlarged lymph nodes: Secondary | ICD-10-CM

## 2023-07-15 HISTORY — DX: Major depressive disorder, single episode, moderate: F32.1

## 2023-07-15 HISTORY — DX: Obesity, class 3: E66.813

## 2023-07-15 HISTORY — DX: Abdominal distension (gaseous): R14.0

## 2023-07-15 HISTORY — DX: Other fecal abnormalities: R19.5

## 2023-07-15 HISTORY — DX: Generalized enlarged lymph nodes: R59.1

## 2023-07-15 NOTE — Assessment & Plan Note (Addendum)
Enlarged, tender lymph nodes in cervical and axillary areas. Partial improvement with previous doxycycline course.  Plan: Order CT scan of neck and chest to evaluate lymphadenopathy. Consider follow-up with hematology/oncology pending imaging results. Monitor for signs of infection or malignancy.

## 2023-07-15 NOTE — Assessment & Plan Note (Signed)
Expedite referral to gastroenterology for colonoscopy. Educate patient on importance of prompt evaluation. Patient reach out to clinic if she has not heard about scheduling within 1 week

## 2023-07-15 NOTE — Assessment & Plan Note (Signed)
Elevated PHQ-9 and expressed emotional distress.  Plan: Provide empathetic support and counseling. Consider referral to behavioral health services  Monitor mood and discuss at next follow-up

## 2023-07-15 NOTE — Assessment & Plan Note (Signed)
Persistent.  Improved with ibuprofen and her milligrams 3 times daily as needed  Plan: Consider referral to pain management specialist. Discuss non-pharmacological pain relief methods. Refill ibuprofen

## 2023-07-15 NOTE — Assessment & Plan Note (Addendum)
Patient experiences worsening severe fatigue and weakness impacting daily activities, including difficulty brushing hair and cooking. Sleeps excessively (up to 23 hours/day) without feeling rested. Elevated PHQ concerning for possible depression.  Concerned that fatigue might be paraneoplastic syndrome versus underlying malignancy.  Has seen hematology in the past year and was considered to be low risk for leukemia/lymphoma.  Plan: Refer to sleep medicine to evaluate obstructive sleep apnea. Order lab work: CBC, CMP, TSH, vitamin D, vitamin B12, iron studies, ESR. Encourage balanced diet and gentle exercise as tolerated. Follow up in 2 weeks to review lab and sleep study results. Discuss mental health assessment at next appointment

## 2023-07-15 NOTE — Assessment & Plan Note (Deleted)
Enlarged, tender lymph nodes in cervical and axillary areas. Partial improvement with previous doxycycline course.  Plan: Order CT scan of neck and chest to evaluate lymphadenopathy. Consider referral to hematology/oncology pending imaging results. Monitor for signs of infection or malignancy.

## 2023-07-15 NOTE — Assessment & Plan Note (Addendum)
Reports constant nausea, bloating, early satiety after minimal food intake. No abdominal pain reported.  Plan: Initiate esomeprazole 40 mg orally once daily. Test for H. pylori infection. Order abdominal ultrasound or CT abdomen and pelvis. Expedite gastroenterology referral. Follow up in 2 weeks to assess response to therapy and review test results.

## 2023-07-15 NOTE — Assessment & Plan Note (Signed)
Worsening severe obesity potentially contributing to fatigue, sleep apnea, and other symptoms.  Plan:  Initiate semaglutide [facility]) therapy as per cardiologist's recommendation; start at 0.25 mg subcutaneously once weekly. Provide education on medication administration and possible side effects. Encourage nutritional counseling and physical activity as tolerated. Monitor weight and response to medication. Follow up in 4 weeks to assess progress.

## 2023-07-15 NOTE — Assessment & Plan Note (Signed)
Stable. Plan: Continue Spironolactone 25 MG tablet daily. Continue Furosemide 40 MG daily Monitor electrolytes and renal function.

## 2023-07-15 NOTE — Assessment & Plan Note (Signed)
Recommend cessation and counseled on harms of smoking ?

## 2023-07-15 NOTE — Assessment & Plan Note (Signed)
Not well-controlled. Plan: Trial levalbuterol inhaler as needed. If no improvement, consider adding inhaled corticosteroid for maintenance therapy (e.g., fluticasone 110 mcg, two puffs BID). Encourage smoking cessation and provided counseling and resources. Order chest CT to evaluate for other pulmonary pathology. Referral to pulmonology to assess for COPD. Follow up in 2 weeks to assess assess breathing Return sooner if symptoms worsen.

## 2023-07-15 NOTE — Telephone Encounter (Signed)
Dr. Janee Morn wants pt to return for repeat CBC in 1-2 weeks. Called pt and scheduled for lab appt on 07/21/23.

## 2023-07-18 ENCOUNTER — Encounter: Payer: Self-pay | Admitting: Family Medicine

## 2023-07-18 ENCOUNTER — Other Ambulatory Visit: Payer: Self-pay | Admitting: Obstetrics and Gynecology

## 2023-07-18 LAB — URINE CULTURE
MICRO NUMBER:: 15969992
SPECIMEN QUALITY:: ADEQUATE

## 2023-07-18 MED ORDER — SEMAGLUTIDE(0.25 OR 0.5MG/DOS) 2 MG/1.5ML ~~LOC~~ SOPN: 0.2500 mg | PEN_INJECTOR | SUBCUTANEOUS | 0 refills | Status: DC

## 2023-07-18 NOTE — Telephone Encounter (Signed)
I called patient to let her know that I will send a request to our PA department to get started on PA for Semaglutide.

## 2023-07-18 NOTE — Telephone Encounter (Signed)
Can we get a prior auth on Semaglutide-Weight Management 0.25 MG/0.5ML SOAJ?

## 2023-07-18 NOTE — Telephone Encounter (Signed)
I called [facility] and spoke to pharmacy tech to check on coverage of Semaglutide and it is still needing a prior auth.

## 2023-07-18 NOTE — Patient Instructions (Signed)
Hi Ms. Barnhard you for speaking with me today-have a good afternoon!  Ms. Castleberry was given information about Medicaid Managed Care team care coordination services as a part of their [employer] [facility] [employer] Medicaid benefit. [name] verbally consented to engagement with the [facility] Managed Care team.   If you are experiencing a medical emergency, please call 911 or report to your local emergency department or urgent care.   If you have a non-emergency medical problem during routine business hours, please contact your provider's office and ask to speak with a nurse.   For questions related to your [facility], please call: [phone] or visit the homepage here: [email or web address]  If you would like to schedule transportation through your [facility], please call the following number at least 2 days in advance of your appointment: [phone]   Rides for urgent appointments can also be made after hours by calling Member Services.  Call the Behavioral Health Crisis Line at [phone], at any time, 24 hours a day, 7 days a week. If you are in danger or need immediate medical attention call 911.  If you would like help to quit smoking, call 1-800-QUIT-NOW (647-783-5889) OR Espaol: 1-855-Djelo-Ya (5-638-756-4332) o para ms informacin haga clic aqu or Text READY to 951-884 to register via text  Ms. Parke Simmers - following are the goals we discussed in your visit today:   Goals Addressed    Timeframe:  Long-Range Goal Priority:  High Start Date:     07/18/23                        Expected End Date:  ongoing                    Follow Up Date 08/18/23    - schedule appointment for vaccines needed due to my age or health - schedule recommended health tests (blood work, mammogram, colonoscopy, pap test) - schedule and keep appointment for annual check-up    Why is this  important?   Screening tests can find diseases early when they are easier to treat.  Your doctor or nurse will talk with you about which tests are important for you.  Getting shots for common diseases like the flu and shingles will help prevent them.    Patient verbalizes understanding of instructions and care plan provided today and agrees to view in MyChart. Active MyChart status and patient understanding of how to access instructions and care plan via MyChart confirmed with patient.     The Managed Medicaid care management team will reach out to the patient again over the next 30 business  days.  The  Patient has been provided with contact information for the Managed Medicaid care management team and has been advised to call with any health related questions or concerns.   Kathi Der RN, BSN, [employer] [facility] RN Care Manager Direct Dial [phone]/FUX [phone] Website: [email or web address]   Following is a copy of your plan of care:  Care Plan : RN Care Manager Plan of Care  Updates made by Danie Chandler, RN since 07/18/2023 12:00 AM     Problem: Health Promotion or Disease Self-Management (General Plan of Care)      Long-Range Goal: Chronic Disease Management   Start Date: 07/18/2023  Expected End Date: 10/16/2023  Priority: High  Note:   Current Barriers:  Knowledge Deficits related to plan of care for  management of OSA, rhinitis, LBP with left sided sciatica, obesity, tobacco use, fatigue, HF Care Coordination needs related to OSA, rhinitis, LBP with left sided sciatica, obesity, tobacco use, fatigue, HF Chronic Disease Management support and education needs related to OSA, rhinitis, LBP with left sided sciatica, obesity, tobacco use, fatigue, HF Financial Constraints regarding not working-trying to get disability  RNCM Clinical Goal(s):  verbalize understanding of plan for management of OSA, rhinitis, LBP with left sided sciatica, obesity,  tobacco use, fatigue, HF as evidenced by patient report verbalize basic understanding of  OSA, rhinitis, LBP with left sided sciatica, obesity, tobacco use, fatigue, HF disease process and self health management plan as evidenced by patient report. take all medications exactly as prescribed and will call provider for medication related questions as evidenced by patient report demonstrate understanding of rationale for each prescribed medication as evidenced by patient report attend all scheduled medical appointments as evidenced by patient report and EMR review demonstrate ongoing adherence to prescribed treatment plan for OSA, rhinitis, LBP with left sided sciatica, obesity, tobacco use, fatigue, HF  as evidenced by patient report and EMR review.  continue to work with RN Care Manager to address care management and care coordination needs related to  OSA, rhinitis, LBP with left sided sciatica, obesity, tobacco use, fatigue, HF as evidenced by adherence to CM Team Scheduled appointments work with Child psychotherapist to address  related to the management of disability needs  related to the management of  OSA, rhinitis, LBP with left sided sciatica, obesity, tobacco use, fatigue, HF as evidenced by review of EMR and patient or Child psychotherapist report through collaboration with Medical illustrator, provider, and care team.   Interventions: Evaluation of current treatment plan related to  self management and patient's adherence to plan as established by provider Collaborated with BSW BSW referral for disability  Heart Failure Interventions:  (Status:  New goal.) Long Term Goal Provided education on low sodium diet Discussed the importance of keeping all appointments with provider Assessed social determinant of health barriers   Smoking Cessation Interventions:  (Status:  New goal.) Long Term Goal Reviewed smoking history:   currently smoking 1/2 ppd  Evaluation of current treatment plan reviewed Reviewed  scheduled/upcoming provider appointments  Provided contact information for [place] Quit Line (1-800-QUIT-NOW) Discussed plans with patient for ongoing care management follow up and provided patient with direct contact information for care management team Assessed social determinant of health barriers  Weight Loss Interventions:  (Status:  New goal.) Long Term Goal Advised patient to discuss with primary care provider options regarding weight management Offered to connect patient with psychology or social work support for counseling and supportive care Reviewed recommended dietary changes: avoid fad diets, make small/incremental dietary and exercise changes, eat at the table and avoid eating in front of the TV, plan management of cravings, monitor snacking and cravings in food diary Assessed social determinant of health barriers   Patient Goals/Self-Care Activities: Take all medications as prescribed Attend all scheduled provider appointments Call pharmacy for medication refills 3-7 days in advance of running out of medications Perform all self care activities independently  Perform IADL's (shopping, preparing meals, housekeeping, managing finances) independently Call provider office for new concerns or questions  Work with the social worker to address care coordination needs and will continue to work with the clinical team to address health care and disease management related needs  Follow Up Plan:  The patient has been provided with contact information for the care management  team and has been advised to call with any health related questions or concerns.  The care management team will reach out to the patient again over the next 30 business  days.

## 2023-07-18 NOTE — Patient Outreach (Signed)
Medicaid Managed Care   Nurse Care Manager Note  07/18/2023 Name:  Theresa Marquez MRN:  [id number] DOB:  Apr 10, 1966  Theresa Marquez is an 58 y.o. year old female who is a primary patient of Garnette Gunner, MD.  The [facility] Managed Care Coordination team was consulted for assistance with:    Chronic healthcare management needs,  OSA, rhinitis, LBP with left sided sciatica, obesity, tobacco use, fatigue, HF  Theresa Marquez was given information about Medicaid Managed Care Coordination team services today. Theresa Marquez Patient agreed to services and verbal consent obtained.  Engaged with patient by telephone for initial visit in response to provider referral for case management and/or care coordination services.   Patient is participating in a Managed Medicaid Plan:  Yes  Assessments/Interventions:  Review of past medical history, allergies, medications, health status, including review of consultants reports, laboratory and other test data, was performed as part of comprehensive evaluation and provision of chronic care management services.  SDOH (Social Drivers of Health) assessments and interventions performed: SDOH Interventions    Flowsheet Row Patient Outreach Telephone from 07/18/2023 in [place] [facility] POPUL[facility]ALTH DEPARTMENT Office Visit from 07/14/2023 in [facility] [place] [facility] Visit from 08/31/2022 in [facility] [place] [facility] [place] ED to Hosp-Admission (Discharged) from 12/01/2021 in [facility] 3E HF PCU  SDOH Interventions      Food Insecurity Interventions -- -- -- Intervention Not Indicated  Housing Interventions -- -- -- Intervention Not Indicated  Transportation Interventions -- -- Other (Comment) Intervention Not Indicated  Depression Interventions/Treatment  -- Medication -- --  Financial Strain Interventions -- -- -- Intervention Not Indicated  Health Literacy Interventions Intervention Not  Indicated -- -- --     Care Plan Allergies  Allergen Reactions   Codeine Other (See Comments)    Hallucinations  Hallucinates    Prednisone Swelling   Medications Reviewed Today     Reviewed by Danie Chandler, RN (Registered Nurse) on 07/18/23 at 1327  Med List Status: <None>   Medication Order Taking? Sig Documenting Provider Last Dose Status Informant  Capsaicin-Menthol-Methyl Sal (CAPSAICIN-METHYL SAL-MENTHOL) 0.025-1-12 % CREA [id number] No Apply 1 Application topically 2 (two) times daily as needed (pain). Garnette Gunner, MD Taking Active   esomeprazole (NEXIUM) 40 MG capsule [id number]  Take 1 capsule (40 mg total) by mouth daily. Garnette Gunner, MD  Active   folic acid (FOLVITE) 1 MG tablet [id number] No Take 1 tablet (1 mg total) by mouth daily.  Patient not taking: Reported on 07/14/2023   Garnette Gunner, MD Not Taking Active   furosemide (LASIX) 40 MG tablet [id number] No Take 1 tablet (40 mg total) by mouth daily. Baldo Daub, MD Taking Active   hydrOXYzine (VISTARIL) 25 MG capsule [id number] No Take 1 capsule (25 mg total) by mouth every 8 (eight) hours as needed.  Patient not taking: Reported on 07/14/2023   Garnette Gunner, MD Not Taking Active   ibuprofen (ADVIL) 800 MG tablet [id number]  Take 1 tablet (800 mg total) by mouth every 8 (eight) hours as needed (pain). Garnette Gunner, MD  Active   ipratropium (ATROVENT) 0.06 % nasal spray [id number] No Place 2 sprays into both nostrils 2 (two) times daily. Garnette Gunner, MD Taking Expired 07/14/23 2359   levalbuterol (XOPENEX HFA) 45 MCG/ACT inhaler [id number]  Inhale 1-2 puffs into the lungs every 6 (six) hours as needed  for wheezing. Garnette Gunner, MD  Active   lidocaine-prilocaine (EMLA) cream [id number] No Apply 1 Application topically as needed.  Patient not taking: Reported on 07/14/2023   Garnette Gunner, MD Not Taking Active   Semaglutide,0.25 or 0.5MG /DOS, 2 MG/1.5ML SOPN [id number]  Inject 0.25 mg  into the skin once a week. Garnette Gunner, MD  Active   spironolactone (ALDACTONE) 25 MG tablet [id number] No Take 1 tablet (25 mg total) by mouth daily. Garnette Gunner, MD Taking Active            Patient Active Problem List   Diagnosis Date Noted   Lymphadenopathy 07/15/2023   Positive colorectal cancer screening using Cologuard test 07/15/2023   Class 3 severe obesity due to excess calories with serious comorbidity and body mass index (BMI) of 45.0 to 49.9 in adult (HCC) 07/15/2023   Abdominal bloating 07/15/2023   Depression, major, single episode, moderate (HCC) 07/15/2023   Myalgia due to statin 07/14/2023   Pain in thoracic spine 06/07/2023   Paresthesia 06/07/2023   Sedimentation rate elevation 03/24/2023   Syncope 03/01/2023   Frequent falls 03/01/2023   Lumbar foraminal stenosis 03/01/2023   Small vessel disease (HCC) 03/01/2023   Closed fracture of tooth 03/01/2023   Neuralgia 12/10/2022   Rash of both hands 12/10/2022   Weakness of extremity 11/14/2022   Seizure-like activity (HCC) 11/14/2022   Cervicalgia 08/24/2022   Sleep disturbance 08/24/2022   Pruritic dermatitis 08/24/2022   Chronic midline low back pain with left-sided sciatica 02/25/2022   OSA (obstructive sleep apnea) 12/14/2021   AKI (acute kidney injury) (HCC) 12/14/2021   Wheezing 12/14/2021   Rhinitis 11/30/2021   Obesity, morbid, BMI 40.0-49.9 (HCC) 11/10/2021   Tobacco abuse 11/10/2021   Bilateral lower extremity edema 11/10/2021   Recurrent sinus infections 11/10/2021   Family history of thyroid disease 10/05/2021   Neck swelling 10/05/2021   Allergy 08/09/2017   Fatigue 12/24/2014   Conditions to be addressed/monitored per PCP order:  OSA, rhinitis, LBP with left sided sciatica, obesity, tobacco use, fatigue, HF  Care Plan : RN Care Manager Plan of Care  Updates made by Danie Chandler, RN since 07/18/2023 12:00 AM     Problem: Health Promotion or Disease Self-Management (General  Plan of Care)      Long-Range Goal: Chronic Disease Management   Start Date: 07/18/2023  Expected End Date: 10/16/2023  Priority: High  Note:   Current Barriers:  Knowledge Deficits related to plan of care for management of OSA, rhinitis, LBP with left sided sciatica, obesity, tobacco use, fatigue, HF Care Coordination needs related to OSA, rhinitis, LBP with left sided sciatica, obesity, tobacco use, fatigue, HF Chronic Disease Management support and education needs related to OSA, rhinitis, LBP with left sided sciatica, obesity, tobacco use, fatigue, HF Financial Constraints regarding not working-trying to get disability  RNCM Clinical Goal(s):  verbalize understanding of plan for management of OSA, rhinitis, LBP with left sided sciatica, obesity, tobacco use, fatigue, HF as evidenced by patient report verbalize basic understanding of  OSA, rhinitis, LBP with left sided sciatica, obesity, tobacco use, fatigue, HF disease process and self health management plan as evidenced by patient report. take all medications exactly as prescribed and will call provider for medication related questions as evidenced by patient report demonstrate understanding of rationale for each prescribed medication as evidenced by patient report attend all scheduled medical appointments as evidenced by patient report and EMR review demonstrate ongoing adherence to prescribed treatment plan for  OSA, rhinitis, LBP with left sided sciatica, obesity, tobacco use, fatigue, HF  as evidenced by patient report and EMR review.  continue to work with RN Care Manager to address care management and care coordination needs related to  OSA, rhinitis, LBP with left sided sciatica, obesity, tobacco use, fatigue, HF as evidenced by adherence to CM Team Scheduled appointments work with Child psychotherapist to address  related to the management of disability needs  related to the management of  OSA, rhinitis, LBP with left sided sciatica, obesity,  tobacco use, fatigue, HF as evidenced by review of EMR and patient or Child psychotherapist report through collaboration with Medical illustrator, provider, and care team.   Interventions: Evaluation of current treatment plan related to  self management and patient's adherence to plan as established by provider Collaborated with BSW BSW referral for disability  Heart Failure Interventions:  (Status:  New goal.) Long Term Goal Provided education on low sodium diet Discussed the importance of keeping all appointments with provider Assessed social determinant of health barriers   Smoking Cessation Interventions:  (Status:  New goal.) Long Term Goal Reviewed smoking history:   currently smoking 1/2 ppd  Evaluation of current treatment plan reviewed Reviewed scheduled/upcoming provider appointments  Provided contact information for [place] Quit Line (1-800-QUIT-NOW) Discussed plans with patient for ongoing care management follow up and provided patient with direct contact information for care management team Assessed social determinant of health barriers  Weight Loss Interventions:  (Status:  New goal.) Long Term Goal Advised patient to discuss with primary care provider options regarding weight management Offered to connect patient with psychology or social work support for counseling and supportive care Reviewed recommended dietary changes: avoid fad diets, make small/incremental dietary and exercise changes, eat at the table and avoid eating in front of the TV, plan management of cravings, monitor snacking and cravings in food diary Assessed social determinant of health barriers   Patient Goals/Self-Care Activities: Take all medications as prescribed Attend all scheduled provider appointments Call pharmacy for medication refills 3-7 days in advance of running out of medications Perform all self care activities independently  Perform IADL's (shopping, preparing meals, housekeeping, managing finances)  independently Call provider office for new concerns or questions  Work with the social worker to address care coordination needs and will continue to work with the clinical team to address health care and disease management related needs  Follow Up Plan:  The patient has been provided with contact information for the care management team and has been advised to call with any health related questions or concerns.  The care management team will reach out to the patient again over the next 30 business  days.   Long-Range Goal: Establish Plan of Care for Chronic Disease Management Needs   Priority: High  Note:   Timeframe:  Long-Range Goal Priority:  High Start Date:     07/18/23                        Expected End Date:  ongoing                    Follow Up Date 08/18/23    - schedule appointment for vaccines needed due to my age or health - schedule recommended health tests (blood work, mammogram, colonoscopy, pap test) - schedule and keep appointment for annual check-up    Why is this important?   Screening tests can find diseases early when they are  easier to treat.  Your doctor or nurse will talk with you about which tests are important for you.  Getting shots for common diseases like the flu and shingles will help prevent them.     Follow Up:  Patient agrees to Care Plan and Follow-up.  Plan: The Managed Medicaid care management team will reach out to the patient again over the next 30 business  days. and The  Patient has been provided with contact information for the Managed Medicaid care management team and has been advised to call with any health related questions or concerns.  Date/time of next scheduled RN care management/care coordination outreach:  08/18/23 at 1230.

## 2023-07-18 NOTE — Addendum Note (Signed)
Addended by: Fanny Bien B on: 07/18/2023 11:53 AM   Modules accepted: Orders

## 2023-07-19 ENCOUNTER — Telehealth: Payer: Self-pay

## 2023-07-19 ENCOUNTER — Other Ambulatory Visit (HOSPITAL_COMMUNITY): Payer: Self-pay

## 2023-07-19 LAB — VITAMIN D 1,25 DIHYDROXY
Vitamin D 1, 25 (OH)2 Total: 45 pg/mL (ref 18–72)
Vitamin D2 1, 25 (OH)2: <8 pg/mL
Vitamin D3 1, 25 (OH)2: 45 pg/mL

## 2023-07-19 LAB — H. PYLORI BREATH TEST: H. pylori Breath Test: NOT DETECTED

## 2023-07-19 NOTE — Telephone Encounter (Signed)
Pharmacy Patient Advocate Encounter   Received notification from Pt Calls Messages that prior authorization for Ozempic 2mg /43ml is required/requested.   Insurance verification completed.   The patient is insured through [facility] MEDICAID .   Per test claim: PA required; PA submitted to above mentioned insurance via CoverMyMeds Key/confirmation #/EOC [id number] Status is pending

## 2023-07-21 ENCOUNTER — Other Ambulatory Visit: Payer: Medicaid Other

## 2023-07-22 ENCOUNTER — Other Ambulatory Visit (HOSPITAL_COMMUNITY): Payer: Self-pay

## 2023-07-22 NOTE — Telephone Encounter (Signed)
Pharmacy Patient Advocate Encounter  Received notification from [facility] MEDICAID that Prior Authorization for Ozempic 2mg /61ml has been DENIED.  See denial reason below. No denial letter attached in CMM. Will attach denial letter to Media tab once received.   PA #/Case ID/Reference #: [id number]

## 2023-07-26 ENCOUNTER — Encounter: Payer: Self-pay | Admitting: Family Medicine

## 2023-07-26 ENCOUNTER — Other Ambulatory Visit (INDEPENDENT_AMBULATORY_CARE_PROVIDER_SITE_OTHER): Payer: Medicaid Other

## 2023-07-26 ENCOUNTER — Telehealth: Payer: Self-pay

## 2023-07-26 DIAGNOSIS — D751 Secondary polycythemia: Secondary | ICD-10-CM

## 2023-07-26 LAB — CBC WITH DIFFERENTIAL/PLATELET
Basophils Absolute: 0.1 10*3/uL (ref 0.0–0.1)
Basophils Relative: 0.7 % (ref 0.0–3.0)
Eosinophils Absolute: 0.2 10*3/uL (ref 0.0–0.7)
Eosinophils Relative: 1.4 % (ref 0.0–5.0)
HCT: 46.3 % — ABNORMAL HIGH (ref 36.0–46.0)
Hemoglobin: 15.1 g/dL — ABNORMAL HIGH (ref 12.0–15.0)
Lymphocytes Relative: 23.5 % (ref 12.0–46.0)
Lymphs Abs: 3.2 10*3/uL (ref 0.7–4.0)
MCHC: 32.6 g/dL (ref 30.0–36.0)
MCV: 91.6 fL (ref 78.0–100.0)
Monocytes Absolute: 0.7 10*3/uL (ref 0.1–1.0)
Monocytes Relative: 4.8 % (ref 3.0–12.0)
Neutro Abs: 9.5 10*3/uL — ABNORMAL HIGH (ref 1.4–7.7)
Neutrophils Relative %: 69.6 % (ref 43.0–77.0)
Platelets: 373 10*3/uL (ref 150.0–400.0)
RBC: 5.05 Mil/uL (ref 3.87–5.11)
RDW: 16.6 % — ABNORMAL HIGH (ref 11.5–15.5)
WBC: 13.7 10*3/uL — ABNORMAL HIGH (ref 4.0–10.5)

## 2023-07-26 NOTE — Telephone Encounter (Signed)
Pharmacy Patient Advocate Encounter  Received notification from  [employer]  that Prior Authorization for Ozempic 2mg /51ml has been APPROVED from 07/26/23 to 07/25/24. Spoke to pharmacy to process.Copay is $4.    PA #/Case ID/Reference #: [id number]

## 2023-07-26 NOTE — Telephone Encounter (Signed)
Pharmacy Patient Advocate Encounter   Received notification from Pt Calls Messages that prior authorization for Ozempic is required/requested.   Insurance verification completed.   The patient is insured through  [facility]  .   Per test claim: PA required; PA submitted to above mentioned insurance via CoverMyMeds Key/confirmation #/EOC [id number] Status is pending

## 2023-07-29 ENCOUNTER — Ambulatory Visit (HOSPITAL_BASED_OUTPATIENT_CLINIC_OR_DEPARTMENT_OTHER)
Admission: RE | Admit: 2023-07-29 | Discharge: 2023-07-29 | Disposition: A | Discharge status: Home / Self Care | Payer: Medicaid Other | Source: Ambulatory Visit | Attending: Family Medicine | Admitting: Family Medicine

## 2023-07-29 ENCOUNTER — Ambulatory Visit (HOSPITAL_BASED_OUTPATIENT_CLINIC_OR_DEPARTMENT_OTHER)
Admission: RE | Admit: 2023-07-29 | Discharge: 2023-07-29 | Disposition: A | Discharge status: Home / Self Care | Payer: Medicaid Other | Source: Ambulatory Visit | Attending: Family Medicine

## 2023-07-29 DIAGNOSIS — I700 Atherosclerosis of aorta: Secondary | ICD-10-CM | POA: Diagnosis not present

## 2023-07-29 DIAGNOSIS — I6529 Occlusion and stenosis of unspecified carotid artery: Secondary | ICD-10-CM | POA: Diagnosis not present

## 2023-07-29 DIAGNOSIS — R091 Pleurisy: Secondary | ICD-10-CM | POA: Insufficient documentation

## 2023-07-29 DIAGNOSIS — K429 Umbilical hernia without obstruction or gangrene: Secondary | ICD-10-CM | POA: Diagnosis not present

## 2023-07-29 DIAGNOSIS — R591 Generalized enlarged lymph nodes: Secondary | ICD-10-CM

## 2023-07-29 DIAGNOSIS — R140 Abdominal distension (gaseous): Secondary | ICD-10-CM | POA: Insufficient documentation

## 2023-07-29 DIAGNOSIS — R160 Hepatomegaly, not elsewhere classified: Secondary | ICD-10-CM | POA: Diagnosis not present

## 2023-07-29 DIAGNOSIS — R590 Localized enlarged lymph nodes: Secondary | ICD-10-CM | POA: Diagnosis not present

## 2023-07-29 DIAGNOSIS — K5730 Diverticulosis of large intestine without perforation or abscess without bleeding: Secondary | ICD-10-CM | POA: Diagnosis not present

## 2023-07-29 DIAGNOSIS — J352 Hypertrophy of adenoids: Secondary | ICD-10-CM | POA: Diagnosis not present

## 2023-07-29 DIAGNOSIS — J984 Other disorders of lung: Secondary | ICD-10-CM | POA: Diagnosis not present

## 2023-07-29 MED ORDER — IOHEXOL 300 MG/ML  SOLN
75.0000 mL | Freq: Once | INTRAMUSCULAR | Status: AC | PRN
  Administered 2023-07-29: 75 mL via INTRAVENOUS

## 2023-08-02 ENCOUNTER — Other Ambulatory Visit: Payer: Self-pay

## 2023-08-02 NOTE — Patient Instructions (Signed)
 Visit Information  Theresa Marquez was given information about Medicaid Managed Care team care coordination services as a part of their [facility] Community Plan Medicaid benefit. Theresa Marquez verbally consented to engagement with the [facility] Managed Care team.   If you are experiencing a medical emergency, please call 911 or report to your local emergency department or urgent care.   If you have a non-emergency medical problem during routine business hours, please contact your provider's office and ask to speak with a nurse.   For questions related to your [facility], please call: [phone] or visit the homepage here: [email or web address]  If you would like to schedule transportation through your [facility], please call the following number at least 2 days in advance of your appointment: [phone]   Rides for urgent appointments can also be made after hours by calling Member Services.  Call the Behavioral Health Crisis Line at [phone], at any time, 24 hours a day, 7 days a week. If you are in danger or need immediate medical attention call 911.  If you would like help to quit smoking, call 1-800-QUIT-NOW ((775) 057-9362) OR Espaol: 1-855-Djelo-Ya (8-144-664-6430) o para ms informacin haga clic aqu or Text READY to 799-599 to register via text  [name] - following are the goals we discussed in your visit today:   Goals Addressed   None      Social Worker will follow up in 30 days.   Theresa Marquez, [name], MHA [facility]  Managed Medicaid Social Worker [phone]   Following is a copy of your plan of care:  There are no care plans that you recently modified to display for this patient.

## 2023-08-02 NOTE — Patient Outreach (Signed)
 Medicaid Managed Care Social Work Note  08/02/2023 Name:  Theresa Marquez MRN:  [id number] DOB:  April 13, 1966  Theresa Marquez is an 58 y.o. year old female who is a primary patient of Sebastian Beverley NOVAK, MD.  The [facility] Managed Care Coordination team was consulted for assistance with:   utility and disability  Ms. Fulwider was given information about Medicaid Managed Care Coordination team services today. Theresa Marquez Patient agreed to services and verbal consent obtained.  Engaged with patient  for by telephone forinitial visit in response to referral for case management and/or care coordination services.   Patient is participating in a Managed Medicaid Plan:  Yes  Assessments/Interventions:  Review of past medical history, allergies, medications, health status, including review of consultants reports, laboratory and other test data, was performed as part of comprehensive evaluation and provision of chronic care management services.  SDOH: (Social Drivers of Health) assessments and interventions performed: SDOH Interventions    Flowsheet Row Patient Outreach Telephone from 07/18/2023 in [place] [facility] Office Visit from 07/14/2023 in [facility] Visit from 08/31/2022 in [facility] [place] ED to Hosp-Admission (Discharged) from 12/01/2021 in [facility] 3E HF PCU  SDOH Interventions      Food Insecurity Interventions -- -- -- Intervention Not Indicated  Housing Interventions -- -- -- Intervention Not Indicated  Transportation Interventions -- -- Other (Comment) Intervention Not Indicated  Depression Interventions/Treatment  -- Medication -- --  Financial Strain Interventions -- -- -- Intervention Not Indicated  Health Literacy Interventions Intervention Not Indicated -- -- --      BSW completed a telephone outreach with patient, she states she has applied for  disability but it was denied the first time, It is now pending and she wants to know what can be done, BSW informed nothing could be done at this time. Patient states she does not have any income, she has 2 adult children living with her that receive disability and she is living off of that. Patient reports she is able to make mortgage but recently had a hard time with the utility bill. BSW and patient agreed for resources for utilities to be mailed.  Advanced Directives Status:  Not addressed in this encounter.  Care Plan                 Allergies  Allergen Reactions   Codeine Other (See Comments)    Hallucinations  Hallucinates    Prednisone Swelling    Medications Reviewed Today   Medications were not reviewed in this encounter     Patient Active Problem List   Diagnosis Date Noted   Lymphadenopathy 07/15/2023   Positive colorectal cancer screening using Cologuard test 07/15/2023   Class 3 severe obesity due to excess calories with serious comorbidity and body mass index (BMI) of 45.0 to 49.9 in adult (HCC) 07/15/2023   Abdominal bloating 07/15/2023   Depression, major, single episode, moderate (HCC) 07/15/2023   Myalgia due to statin 07/14/2023   Pain in thoracic spine 06/07/2023   Paresthesia 06/07/2023   Sedimentation rate elevation 03/24/2023   Syncope 03/01/2023   Frequent falls 03/01/2023   Lumbar foraminal stenosis 03/01/2023   Small vessel disease (HCC) 03/01/2023   Closed fracture of tooth 03/01/2023   Neuralgia 12/10/2022   Rash of both hands 12/10/2022   Weakness of extremity 11/14/2022   Seizure-like activity (HCC) 11/14/2022  Cervicalgia 08/24/2022   Sleep disturbance 08/24/2022   Pruritic dermatitis 08/24/2022   Chronic midline low back pain with left-sided sciatica 02/25/2022   OSA (obstructive sleep apnea) 12/14/2021   AKI (acute kidney injury) (HCC) 12/14/2021   Wheezing 12/14/2021   Rhinitis 11/30/2021   Obesity, morbid, BMI 40.0-49.9 (HCC)  11/10/2021   Tobacco abuse 11/10/2021   Bilateral lower extremity edema 11/10/2021   Recurrent sinus infections 11/10/2021   Family history of thyroid  disease 10/05/2021   Neck swelling 10/05/2021   Allergy 08/09/2017   Fatigue 12/24/2014    Conditions to be addressed/monitored per PCP order:   utility resources  There are no care plans that you recently modified to display for this patient.   Follow up:  Patient agrees to Care Plan and Follow-up.  Plan: The Managed Medicaid care management team will reach out to the patient again over the next 30 days.  Date/time of next scheduled Social Work care management/care coordination outreach:  08/30/23  Thersia Hoar, [name], [facility]  Managed [facility] Social Worker [phone]

## 2023-08-15 ENCOUNTER — Other Ambulatory Visit (HOSPITAL_BASED_OUTPATIENT_CLINIC_OR_DEPARTMENT_OTHER): Payer: Self-pay | Admitting: Family Medicine

## 2023-08-15 ENCOUNTER — Encounter: Payer: Self-pay | Admitting: Family Medicine

## 2023-08-15 ENCOUNTER — Ambulatory Visit
Admission: RE | Admit: 2023-08-15 | Discharge: 2023-08-15 | Disposition: A | Discharge status: Home / Self Care | Payer: Medicaid Other | Source: Ambulatory Visit | Attending: Family Medicine | Admitting: Family Medicine

## 2023-08-15 ENCOUNTER — Ambulatory Visit: Payer: Medicaid Other | Admitting: Family Medicine

## 2023-08-15 VITALS — BP 128/84 | HR 90 | Temp 97.8°F | Wt 262.6 lb

## 2023-08-15 DIAGNOSIS — K760 Fatty (change of) liver, not elsewhere classified: Secondary | ICD-10-CM

## 2023-08-15 DIAGNOSIS — I2510 Atherosclerotic heart disease of native coronary artery without angina pectoris: Secondary | ICD-10-CM

## 2023-08-15 DIAGNOSIS — I700 Atherosclerosis of aorta: Secondary | ICD-10-CM | POA: Insufficient documentation

## 2023-08-15 DIAGNOSIS — I2721 Secondary pulmonary arterial hypertension: Secondary | ICD-10-CM | POA: Insufficient documentation

## 2023-08-15 DIAGNOSIS — J984 Other disorders of lung: Secondary | ICD-10-CM | POA: Insufficient documentation

## 2023-08-15 DIAGNOSIS — G4733 Obstructive sleep apnea (adult) (pediatric): Secondary | ICD-10-CM | POA: Diagnosis not present

## 2023-08-15 DIAGNOSIS — K429 Umbilical hernia without obstruction or gangrene: Secondary | ICD-10-CM

## 2023-08-15 DIAGNOSIS — Z6841 Body Mass Index (BMI) 40.0 and over, adult: Secondary | ICD-10-CM

## 2023-08-15 DIAGNOSIS — Z1231 Encounter for screening mammogram for malignant neoplasm of breast: Secondary | ICD-10-CM | POA: Diagnosis not present

## 2023-08-15 DIAGNOSIS — Z794 Long term (current) use of insulin: Secondary | ICD-10-CM

## 2023-08-15 DIAGNOSIS — R160 Hepatomegaly, not elsewhere classified: Secondary | ICD-10-CM

## 2023-08-15 DIAGNOSIS — M792 Neuralgia and neuritis, unspecified: Secondary | ICD-10-CM

## 2023-08-15 DIAGNOSIS — K5730 Diverticulosis of large intestine without perforation or abscess without bleeding: Secondary | ICD-10-CM

## 2023-08-15 DIAGNOSIS — E66813 Obesity, class 3: Secondary | ICD-10-CM | POA: Diagnosis not present

## 2023-08-15 DIAGNOSIS — J410 Simple chronic bronchitis: Secondary | ICD-10-CM | POA: Diagnosis not present

## 2023-08-15 DIAGNOSIS — J3503 Chronic tonsillitis and adenoiditis: Secondary | ICD-10-CM

## 2023-08-15 DIAGNOSIS — Z7985 Long-term (current) use of injectable non-insulin antidiabetic drugs: Secondary | ICD-10-CM

## 2023-08-15 DIAGNOSIS — E119 Type 2 diabetes mellitus without complications: Secondary | ICD-10-CM

## 2023-08-15 HISTORY — DX: Secondary pulmonary arterial hypertension: I27.21

## 2023-08-15 HISTORY — DX: Chronic tonsillitis and adenoiditis: J35.03

## 2023-08-15 HISTORY — DX: Umbilical hernia without obstruction or gangrene: K42.9

## 2023-08-15 HISTORY — DX: Fatty (change of) liver, not elsewhere classified: K76.0

## 2023-08-15 HISTORY — DX: Diverticulosis of large intestine without perforation or abscess without bleeding: K57.30

## 2023-08-15 HISTORY — DX: Long term (current) use of insulin: Z79.4

## 2023-08-15 HISTORY — DX: Other disorders of lung: J98.4

## 2023-08-15 HISTORY — DX: Hepatomegaly, not elsewhere classified: R16.0

## 2023-08-15 HISTORY — DX: Atherosclerosis of aorta: I70.0

## 2023-08-15 HISTORY — DX: Atherosclerotic heart disease of native coronary artery without angina pectoris: I25.10

## 2023-08-15 MED ORDER — PRAVASTATIN SODIUM 20 MG PO TABS: 20.0000 mg | ORAL_TABLET | Freq: Every day | ORAL | 0 refills | Status: DC

## 2023-08-15 MED ORDER — SEMAGLUTIDE(0.25 OR 0.5MG/DOS) 2 MG/1.5ML ~~LOC~~ SOPN: 0.5000 mg | PEN_INJECTOR | SUBCUTANEOUS | 0 refills | Status: AC

## 2023-08-15 NOTE — Progress Notes (Unsigned)
Assessment/Plan:   Problem List Items Addressed This Visit       Cardiovascular and Mediastinum   Coronary artery disease involving native coronary artery of native heart without angina pectoris   Relevant Medications   pravastatin (PRAVACHOL) 20 MG tablet   Semaglutide,0.25 or 0.5MG /DOS, 2 MG/1.5ML SOPN   Aortic atherosclerosis (HCC)   Relevant Medications   pravastatin (PRAVACHOL) 20 MG tablet     Respiratory   Tonsillitis and adenoiditis, chronic   Relevant Orders   Ambulatory referral to ENT     Endocrine   Type 2 diabetes mellitus without complication, with long-term current use of insulin (HCC) - Primary   Relevant Medications   pravastatin (PRAVACHOL) 20 MG tablet   Semaglutide,0.25 or 0.5MG /DOS, 2 MG/1.5ML SOPN     Other   Class 3 severe obesity due to excess calories with serious comorbidity and body mass index (BMI) of 45.0 to 49.9 in adult [facility])   Relevant Medications   Semaglutide,0.25 or 0.5MG /DOS, 2 MG/1.5ML SOPN    Medications Discontinued During This Encounter  Medication Reason   folic acid (FOLVITE) 1 MG tablet    lidocaine-prilocaine (EMLA) cream    hydrOXYzine (VISTARIL) 25 MG capsule    Capsaicin-Menthol-Methyl Sal (CAPSAICIN-METHYL SAL-MENTHOL) 0.025-1-12 % CREA    Semaglutide,0.25 or 0.5MG /DOS, 2 MG/1.5ML SOPN Reorder    Return in about 3 months (around 11/12/2023) for weight management.    Subjective:   Encounter date: 08/15/2023  Theresa Marquez is a 58 y.o. female who has Family history of thyroid disease; Fatigue; Neck swelling; Obesity, morbid, BMI 40.0-49.9 (HCC); Tobacco abuse; Bilateral lower extremity edema; Recurrent sinus infections; Rhinitis; OSA (obstructive sleep apnea); AKI (acute kidney injury) (HCC); Wheezing; Chronic midline low back pain with left-sided sciatica; Allergy; Cervicalgia; Sleep disturbance; Pruritic dermatitis; Weakness of extremity; Seizure-like activity (HCC); Neuralgia; Rash of both hands; Syncope; Frequent  falls; Lumbar foraminal stenosis; Small vessel disease (HCC); Closed fracture of tooth; Sedimentation rate elevation; Pain in thoracic spine; Paresthesia; Myalgia due to statin; Lymphadenopathy; Positive colorectal cancer screening using Cologuard test; Class 3 severe obesity due to excess calories with serious comorbidity and body mass index (BMI) of 45.0 to 49.9 in adult [facility]); Abdominal bloating; Depression, major, single episode, moderate (HCC); Small airways disease; Colon, diverticulosis; Pulmonary artery hypertension (HCC); Coronary artery disease involving native coronary artery of native heart without angina pectoris; Aortic atherosclerosis (HCC); Umbilical hernia without obstruction and without gangrene; Tonsillitis and adenoiditis, chronic; Hepatomegaly; Hepatic steatosis; and Type 2 diabetes mellitus without complication, with long-term current use of insulin (HCC) on their problem list..   She  has a past medical history of Allergy and Endometrial polyp (08/09/2017)..   She presents with chief complaint of Medical Management of Chronic Issues (Weight management. Patient states that her breathing and dizziness hasn't changed. She states she has been dealing with it for years and would like some relief.) .   HPI:  The 10-year ASCVD risk score (Arnett DK, et al., 2019) is: 22.8%   Values used to calculate the score:     Age: 35 years     Sex: Female     Is Non-Hispanic African American: No     Diabetic: Yes     Tobacco smoker: Yes     Systolic Blood Pressure: 128 mmHg     Is BP treated: Yes     HDL Cholesterol: 32.5 mg/dL     Total Cholesterol: 223 mg/dL  ROS  Past Surgical History:  Procedure Laterality Date   DG  GALL BLADDER Right     Outpatient Medications Prior to Visit  Medication Sig Dispense Refill   esomeprazole (NEXIUM) 40 MG capsule Take 1 capsule (40 mg total) by mouth daily. 30 capsule 3   furosemide (LASIX) 40 MG tablet Take 1 tablet (40 mg total) by mouth daily.  90 tablet 3   ibuprofen (ADVIL) 800 MG tablet Take 1 tablet (800 mg total) by mouth every 8 (eight) hours as needed (pain). 90 tablet 0   levalbuterol (XOPENEX HFA) 45 MCG/ACT inhaler Inhale 1-2 puffs into the lungs every 6 (six) hours as needed for wheezing. 15 g 0   spironolactone (ALDACTONE) 25 MG tablet Take 1 tablet (25 mg total) by mouth daily. 90 tablet 3   Semaglutide,0.25 or 0.5MG /DOS, 2 MG/1.5ML SOPN Inject 0.25 mg into the skin once a week. 1.5 mL 0   ipratropium (ATROVENT) 0.06 % nasal spray Place 2 sprays into both nostrils 2 (two) times daily. 12 mL 2   Capsaicin-Menthol-Methyl Sal (CAPSAICIN-METHYL SAL-MENTHOL) 0.025-1-12 % CREA Apply 1 Application topically 2 (two) times daily as needed (pain). (Patient not taking: Reported on 08/15/2023)     folic acid (FOLVITE) 1 MG tablet Take 1 tablet (1 mg total) by mouth daily. (Patient not taking: Reported on 08/15/2023)     hydrOXYzine (VISTARIL) 25 MG capsule Take 1 capsule (25 mg total) by mouth every 8 (eight) hours as needed. (Patient not taking: Reported on 08/15/2023) 90 capsule 0   lidocaine-prilocaine (EMLA) cream Apply 1 Application topically as needed. (Patient not taking: Reported on 08/15/2023) 30 g 0   No facility-administered medications prior to visit.    Family History  Problem Relation Age of Onset   Kidney disease Mother    Cancer Father    Cerebral palsy Daughter    Breast cancer Neg Hx     Social History   Socioeconomic History   Marital status: Divorced    Spouse name: Not on file   Number of children: 3   Years of education: Not on file   Highest education level: Associate degree: academic program  Occupational History   Occupation: [employer]    Comment: Full-Time.  Tobacco Use   Smoking status: Every Day    Current packs/day: 0.25    Average packs/day: 0.3 packs/day for 53.8 years (13.5 ttl pk-yrs)    Types: Cigarettes    Start date: 10/26/1969   Smokeless tobacco: Never   Tobacco comments:    Smoking  cessation  Vaping Use   Vaping status: Never Used  Substance and Sexual Activity   Alcohol use: Not Currently    Comment: socially   Drug use: Never   Sexual activity: Not Currently  Other Topics Concern   Not on file  Social History Narrative   Right Handed    Lives in a one story home with a basement   Social Drivers of Health   Financial Resource Strain: High Risk (06/13/2023)   Overall Financial Resource Strain (CARDIA)    Difficulty of Paying Living Expenses: Very hard  Food Insecurity: No Food Insecurity (06/13/2023)   Hunger Vital Sign    Worried About Running Out of Food in the Last Year: Never true    Ran Out of Food in the Last Year: Never true  Transportation Needs: Unmet Transportation Needs (06/13/2023)   PRAPARE - Administrator, Civil Service (Medical): Yes    Lack of Transportation (Non-Medical): No  Physical Activity: Unknown (06/13/2023)   Exercise Vital Sign  Days of Exercise per Week: 0 days    Minutes of Exercise per Session: Not on file  Stress: No Stress Concern Present (06/13/2023)   [employer] of Occupational Health - Occupational Stress Questionnaire    Feeling of Stress : Only a little  Social Connections: Moderately Isolated (06/13/2023)   Social Connection and Isolation Panel [NHANES]    Frequency of Communication with Friends and Family: Three times a week    Frequency of Social Gatherings with Friends and Family: Never    Attends Religious Services: 1 to 4 times per year    Active Member of [employer] or Organizations: No    Attends Engineer, structural: Not on file    Marital Status: Divorced  Intimate Partner Violence: Not At Risk (07/18/2023)   Humiliation, Afraid, Rape, and Kick questionnaire    Fear of Current or Ex-Partner: No    Emotionally Abused: No    Physically Abused: No    Sexually Abused: No                                                                                                  Objective:   Physical Exam: BP 128/84   Pulse 90   Temp 97.8 F (36.6 C) (Temporal)   Wt 262 lb 9.6 oz (119.1 kg)   LMP  (LMP Unknown)   SpO2 97%   BMI 45.08 kg/m    Wt Readings from Last 3 Encounters:  08/15/23 262 lb 9.6 oz (119.1 kg)  07/14/23 273 lb 6.4 oz (124 kg)  06/07/23 280 lb 3.2 oz (127.1 kg)     Physical Exam  CT CHEST WO CONTRAST Result Date: 08/13/2023 CLINICAL DATA:  COPD suspected, pleurisy. EXAM: CT CHEST WITHOUT CONTRAST TECHNIQUE: Multidetector CT imaging of the chest was performed following the standard protocol without IV contrast. RADIATION DOSE REDUCTION: This exam was performed according to the departmental dose-optimization program which includes automated exposure control, adjustment of the mA and/or kV according to patient size and/or use of iterative reconstruction technique. COMPARISON:  None chest radiograph 12/01/2021 FINDINGS: Cardiovascular: The heart is normal in size. There are coronary artery calcifications. The main pulmonary artery is dilated at 3.8 cm. Thoracic aorta is normal in caliber. Mild aortic atherosclerosis. No pericardial effusion. Mediastinum/Nodes: No enlarged mediastinal lymph nodes. Limited hilar assessment in the absence of IV contrast. The esophagus is decompressed. No visible thyroid nodule. Lungs/Pleura: Mild heterogeneous pulmonary parenchyma. No focal airspace disease. No pleural effusion. No pulmonary nodule or mass. The trachea and central airways are clear. No emphysema. Upper Abdomen: Assessed on abdominopelvic CT performed concurrently, reported separately. Musculoskeletal: Thoracic spondylosis with anterior spurring. There are no acute or suspicious osseous abnormalities. IMPRESSION: 1. Mild heterogeneous pulmonary parenchyma, can be seen with small airways disease. 2. Dilated main pulmonary artery, suggestive of pulmonary arterial hypertension. 3. Coronary artery calcifications. Aortic Atherosclerosis (ICD10-I70.0). Electronically Signed    By: Narda Rutherford M.D.   On: 08/13/2023 20:18   CT ABDOMEN PELVIS WO CONTRAST Result Date: 08/13/2023 CLINICAL DATA:  Epigastric pain.  Abdominal bloating. EXAM: CT ABDOMEN AND PELVIS WITHOUT CONTRAST TECHNIQUE:  Multidetector CT imaging of the abdomen and pelvis was performed following the standard protocol without IV contrast. RADIATION DOSE REDUCTION: This exam was performed according to the departmental dose-optimization program which includes automated exposure control, adjustment of the mA and/or kV according to patient size and/or use of iterative reconstruction technique. COMPARISON:  CT 08/02/2017 FINDINGS: Lower chest: Assessed on concurrent chest CT, reported separately. Hepatobiliary: The liver is enlarged spanning 20 cm cranial caudal with diffuse steatosis. No evidence of focal liver abnormality. Clips in the gallbladder fossa postcholecystectomy. No biliary dilatation. Pancreas: Unremarkable unenhanced appearance. Spleen: Unremarkable unenhanced appearance. Adrenals/Urinary Tract: No adrenal nodule. No hydronephrosis or renal calculi. No evidence of focal renal abnormality on this unenhanced exam. Mild symmetric bilateral perinephric stranding. Unremarkable urinary bladder, normal for degree of distension. Stomach/Bowel: Ingested material distends the stomach. There is no small bowel obstruction or inflammatory change. Normal appendix visualized. Moderate colonic stool burden. Scattered colonic diverticulosis. No diverticulitis or acute colonic inflammation. No evidence of colonic mass. Vascular/Lymphatic: Aortic and branch atherosclerosis. No aortic aneurysm. Mild central mesenteric edema with small mesenteric lymph nodes, chronic, unchanged from prior exam. No suspicious abdominopelvic adenopathy. Reproductive: Uterus and bilateral adnexa are unremarkable. Other: No ascites. No abdominopelvic collection. Small fat containing umbilical hernia. Musculoskeletal: L3-L4 and L5-S1 degenerative disc  disease. There are no acute or suspicious osseous abnormalities. IMPRESSION: 1. No acute abnormality or explanation for abdominal pain. 2. Hepatomegaly with hepatic steatosis. 3. Colonic diverticulosis without diverticulitis. Aortic Atherosclerosis (ICD10-I70.0). Electronically Signed   By: Narda Rutherford M.D.   On: 08/13/2023 20:12   CT Soft Tissue Neck W Contrast Result Date: 08/13/2023 CLINICAL DATA:  58 year old female with neck mass, lymphadenopathy, soft tissue infection. EXAM: CT NECK WITH CONTRAST TECHNIQUE: Multidetector CT imaging of the neck was performed using the standard protocol following the bolus administration of intravenous contrast. RADIATION DOSE REDUCTION: This exam was performed according to the departmental dose-optimization program which includes automated exposure control, adjustment of the mA and/or kV according to patient size and/or use of iterative reconstruction technique. CONTRAST:  75mL OMNIPAQUE IOHEXOL 300 MG/ML  SOLN COMPARISON:  CT Chest, Abdomen, and Pelvis the same day reported separately. Report of thyroid ultrasound 10/07/2021 (no images available). Previous [facility] neck CT 06/17/2018 FINDINGS: Pharynx and larynx: The glottis is closed now. Generalized pharyngeal and tonsillar hypertrophy again noted but does not appear significantly changed since the 2019 CT. There are chronic postinflammatory dystrophic calcifications of the palatine tonsils. No inflammation in the parapharyngeal or retropharyngeal spaces. Incidental chronic retropharyngeal course of both carotid arteries, normal variant. No laryngeal or pharyngeal mass or hyperenhancement identified. Salivary glands: Negative.  Negative sublingual space. Thyroid: Negative. Lymph nodes: Bilateral cervical lymph nodes are subcentimeter, and are generally regressed from the 2019 CT. No enlarged or heterogeneous lymph nodes. No cervical lymphadenopathy. Vascular:  Retropharyngeal course of both carotid arteries, normal variant. The major vascular structures in the neck and at the skull base are enhancing and appear to be patent. Mild for age cervical carotid atherosclerosis. Limited intracranial: Negative. Visualized orbits: Negative. Mastoids and visualized paranasal sinuses: Visualized paranasal sinuses and mastoids are stable and well aerated. Skeleton: Occasional dental caries. Ordinary cervical spine degeneration. No acute or suspicious osseous abnormality identified. Upper chest: Reported separately.  Calcified aortic atherosclerosis. IMPRESSION: 1. No acute or inflammatory process identified in the Neck. No mass or lymphadenopathy. Chronic generalized pharyngeal tonsillar/adenoid hypertrophy is not significantly changed from 2019. 2.  CT Chest, Abdomen, and Pelvis today are  reported separately. Electronically Signed   By: Odessa Fleming M.D.   On: 08/13/2023 11:58    Recent Results (from the past 2160 hours)  TSH     Status: None   Collection Time: 07/14/23  2:36 PM  Result Value Ref Range   TSH 2.07 0.35 - 5.50 uIU/mL  Lipid panel     Status: Abnormal   Collection Time: 07/14/23  2:36 PM  Result Value Ref Range   Cholesterol 223 (H) 0 - 200 mg/dL    Comment: ATP III Classification       Desirable:  < 200 mg/dL               Borderline High:  200 - 239 mg/dL          High:  > = 161 mg/dL   Triglycerides 096.0 (H) 0.0 - 149.0 mg/dL    Comment: Normal:  <454 mg/dLBorderline High:  150 - 199 mg/dL   HDL 09.81 (L) >19.14 mg/dL   VLDL 78.2 (H) 0.0 - 95.6 mg/dL   LDL Cholesterol 213 (H) 0 - 99 mg/dL   Total CHOL/HDL Ratio 7     Comment:                Men          Women1/2 Average Risk     3.4          3.3Average Risk          5.0          4.42X Average Risk          9.6          7.13X Average Risk          15.0          11.0                       NonHDL 190.01     Comment: NOTE:  Non-HDL goal should be 30 mg/dL higher than patient's LDL goal (i.e. LDL goal of  < 70 mg/dL, would have non-HDL goal of < 100 mg/dL)  Hemoglobin Y8M     Status: Abnormal   Collection Time: 07/14/23  2:36 PM  Result Value Ref Range   Hgb A1c MFr Bld 6.9 (H) 4.6 - 6.5 %    Comment: Glycemic Control Guidelines for People with Diabetes:Non Diabetic:  <6%Goal of Therapy: <7%Additional Action Suggested:  >8%   Microalbumin / creatinine urine ratio     Status: Abnormal   Collection Time: 07/14/23  2:36 PM  Result Value Ref Range   Microalb, Ur 3.9 (H) 0.0 - 1.9 mg/dL   Creatinine,U 578.4 mg/dL   Microalb Creat Ratio 1.4 0.0 - 30.0 mg/g  Urinalysis, Routine w reflex microscopic     Status: Abnormal   Collection Time: 07/14/23  2:36 PM  Result Value Ref Range   Color, Urine YELLOW Yellow;Lt. Yellow;Straw;Dark Yellow;Amber;Green;Red;Brown   APPearance Cloudy (A) Clear;Turbid;Slightly Cloudy;Cloudy   Specific Gravity, Urine 1.020 1.000 - 1.030   pH 6.5 5.0 - 8.0   Total Protein, Urine TRACE (A) Negative   Urine Glucose NEGATIVE Negative   Ketones, ur TRACE (A) Negative   Bilirubin Urine NEGATIVE Negative   Hgb urine dipstick NEGATIVE Negative   Urobilinogen, UA 0.2 0.0 - 1.0   Leukocytes,Ua TRACE (A) Negative   Nitrite NEGATIVE Negative   WBC, UA 7-10/hpf (A) 0-2/hpf   RBC / HPF 0-2/hpf 0-2/hpf   Mucus, UA Presence  of (A) None   Squamous Epithelial / HPF Many(>10/hpf) (A) Rare(0-4/hpf)   Bacteria, UA Few(10-50/hpf) (A) None  Vitamin D 1,25 dihydroxy     Status: None   Collection Time: 07/14/23  2:36 PM  Result Value Ref Range   Vitamin D 1, 25 (OH)2 Total 45 18 - 72 pg/mL   Vitamin D3 1, 25 (OH)2 45 pg/mL   Vitamin D2 1, 25 (OH)2 <8 pg/mL    Comment: (Note) Vitamin D3, 1,25(OH)2 indicates both endogenous  production and supplementation. Vitamin D2, 1,25(OH)2 is  an indicator of exogenous sources, such as diet or  supplementation. Interpretation and therapy are based on  measurement of Vitamin D, 1,25 (OH)2, Total. . This test was developed, and its analytical  performance  characteristics have been determined by [employer]. It has not been cleared or approved by the  FDA. This assay has been validated pursuant to the CLIA  regulations and is used for clinical purposes. . For additional information, please refer to http://education.[facility].com/faq/FAQ199 (This link is being provided for  informational/educational purposes only.) . [facility] [address] [place] [address] [phone] Junita Push L. Flem Enderle Caul, MD, PhD   CBC with Differential/Platelet     Status: Abnormal   Collection Time: 07/14/23  2:36 PM  Result Value Ref Range   WBC 19.2 Repeated and verified X2. (HH) 4.0 - 10.5 K/uL   RBC 5.02 3.87 - 5.11 Mil/uL   Hemoglobin 14.9 12.0 - 15.0 g/dL   HCT 13.0 (H) 86.5 - 78.4 %   MCV 92.1 78.0 - 100.0 fl   MCHC 32.3 30.0 - 36.0 g/dL   RDW 69.6 (H) 29.5 - 28.4 %   Platelets 368.0 Repeated and verified X2. 150.0 - 400.0 K/uL    Comment: verified by smear   Neutrophils Relative % 73.4 43.0 - 77.0 %   Lymphocytes Relative 19.4 12.0 - 46.0 %   Monocytes Relative 5.0 3.0 - 12.0 %   Eosinophils Relative 0.9 0.0 - 5.0 %   Basophils Relative 1.3 0.0 - 3.0 %   Neutro Abs 14.1 (H) 1.4 - 7.7 K/uL   Lymphs Abs 3.7 0.7 - 4.0 K/uL   Monocytes Absolute 1.0 0.1 - 1.0 K/uL   Eosinophils Absolute 0.2 0.0 - 0.7 K/uL   Basophils Absolute 0.3 (H) 0.0 - 0.1 K/uL  Comprehensive metabolic panel     Status: None   Collection Time: 07/14/23  2:36 PM  Result Value Ref Range   Sodium 139 135 - 145 mEq/L   Potassium 3.6 3.5 - 5.1 mEq/L   Chloride 100 96 - 112 mEq/L   CO2 29 19 - 32 mEq/L   Glucose, Bld 89 70 - 99 mg/dL   BUN 7 6 - 23 mg/dL   Creatinine, Ser 1.32 0.40 - 1.20 mg/dL   Total Bilirubin 0.4 0.2 - 1.2 mg/dL   Alkaline Phosphatase 108 39 - 117 U/L   AST 15 0 - 37 U/L   ALT 18 0 - 35 U/L   Total Protein 6.9 6.0 - 8.3 g/dL   Albumin 3.7 3.5 - 5.2 g/dL   GFR 44.01 >02.72 mL/min    Comment:  Calculated using the CKD-EPI Creatinine Equation (2021)   Calcium 9.1 8.4 - 10.5 mg/dL  H. pylori breath test     Status: None   Collection Time: 07/14/23  2:36 PM  Result Value Ref Range   H. pylori Breath Test NOT DETECTED NOT DETECTED    Comment: .  Antimicrobials, proton pump inhibitors, and bismuth preparations are known to suppress H. pylori, and  ingestion of these prior to H. pylori diagnostic testing may lead to false negative results. If clinically  indicated, the test may be repeated on a new specimen obtained two weeks after discontinuing treatment. However, a positive result is still clinically valid.   Urine Culture     Status: None   Collection Time: 07/15/23  8:09 AM   Specimen: Urine  Result Value Ref Range   MICRO NUMBER: [id number]    SPECIMEN QUALITY: Adequate    Sample Source URINE    STATUS: FINAL    ISOLATE 1:      10,000-49,000 CFU/mL of Non-uropathogenic Gram positive organism May represent colonizers from external and internal genitalia. No further testing (including susceptibility) will be performed.  CBC w/Diff     Status: Abnormal   Collection Time: 07/26/23  1:43 PM  Result Value Ref Range   WBC 13.7 (H) 4.0 - 10.5 K/uL   RBC 5.05 3.87 - 5.11 Mil/uL   Hemoglobin 15.1 (H) 12.0 - 15.0 g/dL   HCT 54.0 (H) 98.1 - 19.1 %   MCV 91.6 78.0 - 100.0 fl   MCHC 32.6 30.0 - 36.0 g/dL   RDW 47.8 (H) 29.5 - 62.1 %   Platelets 373.0 150.0 - 400.0 K/uL   Neutrophils Relative % 69.6 43.0 - 77.0 %   Lymphocytes Relative 23.5 12.0 - 46.0 %   Monocytes Relative 4.8 3.0 - 12.0 %   Eosinophils Relative 1.4 0.0 - 5.0 %   Basophils Relative 0.7 0.0 - 3.0 %   Neutro Abs 9.5 (H) 1.4 - 7.7 K/uL   Lymphs Abs 3.2 0.7 - 4.0 K/uL   Monocytes Absolute 0.7 0.1 - 1.0 K/uL   Eosinophils Absolute 0.2 0.0 - 0.7 K/uL   Basophils Absolute 0.1 0.0 - 0.1 K/uL        Garner Nash, MD, MS

## 2023-08-16 DIAGNOSIS — J410 Simple chronic bronchitis: Secondary | ICD-10-CM | POA: Insufficient documentation

## 2023-08-16 HISTORY — DX: Simple chronic bronchitis: J41.0

## 2023-08-18 ENCOUNTER — Encounter: Payer: Self-pay | Admitting: Family Medicine

## 2023-08-18 ENCOUNTER — Other Ambulatory Visit: Payer: Self-pay | Admitting: Obstetrics and Gynecology

## 2023-08-18 ENCOUNTER — Other Ambulatory Visit: Payer: Self-pay | Admitting: Family Medicine

## 2023-08-18 DIAGNOSIS — N6489 Other specified disorders of breast: Secondary | ICD-10-CM

## 2023-08-18 HISTORY — DX: Other specified disorders of breast: N64.89

## 2023-08-18 NOTE — Patient Outreach (Signed)
Medicaid Managed Care   Nurse Care Manager Note  08/18/2023 Name:  Theresa Marquez MRN:  [id number] DOB:  1965-08-17  Theresa Marquez is an 58 y.o. year old female who is a primary patient of Theresa Gunner, MD.  The Medicaid Managed Care Coordination team was consulted for assistance with:    Chronic healthcare management needs, OSA, rhinitis, LBP with left sciatica, obesity, tobacco use, CAD, DM, fatigue, HF, tonsilitis, hepatomegaly  Theresa Marquez was given information about Medicaid Managed Care Coordination team services today. Theresa Marquez Patient agreed to services and verbal consent obtained.  Engaged with patient by telephone for follow up visit in response to provider referral for case management and/or care coordination services.   Patient is participating in a Managed Medicaid Plan:  Yes  Assessments/Interventions:  Review of past medical history, allergies, medications, health status, including review of consultants reports, laboratory and other test data, was performed as part of comprehensive evaluation and provision of chronic care management services.  SDOH (Social Drivers of Health) assessments and interventions performed: SDOH Interventions    Flowsheet Row Patient Outreach Telephone from 08/18/2023 in [place] HEALTH POPULATION HEALTH DEPARTMENT Patient Outreach Telephone from 07/18/2023 in [place] HEALTH POPULATION HEALTH DEPARTMENT Office Visit from 07/14/2023 in [facility] Visit from 08/31/2022 in [facility] [place] ED to Hosp-Admission (Discharged) from 12/01/2021 in [facility] 3E HF PCU  SDOH Interventions       Food Insecurity Interventions -- -- -- -- Intervention Not Indicated  Housing Interventions -- -- -- -- Intervention Not Indicated  Transportation Interventions -- -- -- Other (Comment) Intervention Not Indicated  Utilities Interventions Intervention Not Indicated --  -- -- --  Alcohol Usage Interventions Intervention Not Indicated (Score <7) -- -- -- --  Depression Interventions/Treatment  -- -- Medication -- --  Financial Strain Interventions -- -- -- -- Intervention Not Indicated  Health Literacy Interventions -- Intervention Not Indicated -- -- --     Care Plan Allergies  Allergen Reactions   Codeine Other (See Comments)    Hallucinations  Hallucinates    Prednisone Swelling   Statins Other (See Comments)    Myalgias   Medications Reviewed Today     Reviewed by Theresa Chandler, RN (Registered Nurse) on 08/18/23 at 1235  Med List Status: <None>   Medication Order Taking? Sig Documenting Provider Last Dose Status Informant  esomeprazole (NEXIUM) 40 MG capsule [id number] No Take 1 capsule (40 mg total) by mouth daily. Theresa Gunner, MD Taking Active   furosemide (LASIX) 40 MG tablet [id number] No Take 1 tablet (40 mg total) by mouth daily. Theresa Daub, MD Taking Active   ibuprofen (ADVIL) 800 MG tablet [id number] No Take 1 tablet (800 mg total) by mouth every 8 (eight) hours as needed (pain). Theresa Gunner, MD Taking Active   ipratropium (ATROVENT) 0.06 % nasal spray [id number] No Place 2 sprays into both nostrils 2 (two) times daily. Theresa Gunner, MD Taking Expired 07/14/23 2359   levalbuterol (XOPENEX HFA) 45 MCG/ACT inhaler [id number] No Inhale 1-2 puffs into the lungs every 6 (six) hours as needed for wheezing. Theresa Gunner, MD Taking Active   pravastatin (PRAVACHOL) 20 MG tablet [id number]  Take 1 tablet (20 mg total) by mouth daily. Theresa Gunner, MD  Active   Semaglutide,0.25 or 0.5MG /DOS, 2 MG/1.5ML Namon Cirri [id number]  Inject 0.5 mg into the skin  once a week. Theresa Gunner, MD  Active   spironolactone (ALDACTONE) 25 MG tablet [id number] No Take 1 tablet (25 mg total) by mouth daily. Theresa Gunner, MD Taking Active            Patient Active Problem List   Diagnosis Date Noted   Simple chronic bronchitis (HCC)  08/16/2023   Small airways disease 08/15/2023   Colon, diverticulosis 08/15/2023   Pulmonary artery hypertension (HCC) 08/15/2023   Coronary artery disease involving native coronary artery of native heart without angina pectoris 08/15/2023   Aortic atherosclerosis (HCC) 08/15/2023   Umbilical hernia without obstruction and without gangrene 08/15/2023   Tonsillitis and adenoiditis, chronic 08/15/2023   Hepatomegaly 08/15/2023   Metabolic dysfunction-associated steatotic liver disease (MASLD) 08/15/2023   Type 2 diabetes mellitus without complication, with long-term current use of insulin (HCC) 08/15/2023   Lymphadenopathy 07/15/2023   Positive colorectal cancer screening using Cologuard test 07/15/2023   Class 3 severe obesity due to excess calories with serious comorbidity and body mass index (BMI) of 45.0 to 49.9 in adult (HCC) 07/15/2023   Abdominal bloating 07/15/2023   Depression, major, single episode, moderate (HCC) 07/15/2023   Myalgia due to statin 07/14/2023   Pain in thoracic spine 06/07/2023   Paresthesia 06/07/2023   Sedimentation rate elevation 03/24/2023   Syncope 03/01/2023   Frequent falls 03/01/2023   Lumbar foraminal stenosis 03/01/2023   Small vessel disease (HCC) 03/01/2023   Closed fracture of tooth 03/01/2023   Neuralgia 12/10/2022   Rash of both hands 12/10/2022   Weakness of extremity 11/14/2022   Seizure-like activity (HCC) 11/14/2022   Cervicalgia 08/24/2022   Sleep disturbance 08/24/2022   Pruritic dermatitis 08/24/2022   Chronic midline low back pain with left-sided sciatica 02/25/2022   OSA (obstructive sleep apnea) 12/14/2021   AKI (acute kidney injury) (HCC) 12/14/2021   Wheezing 12/14/2021   Rhinitis 11/30/2021   Obesity, morbid, BMI 40.0-49.9 (HCC) 11/10/2021   Tobacco abuse 11/10/2021   Bilateral lower extremity edema 11/10/2021   Recurrent sinus infections 11/10/2021   Family history of thyroid disease 10/05/2021   Neck swelling 10/05/2021    Allergy 08/09/2017   Fatigue 12/24/2014   Conditions to be addressed/monitored per PCP order:  Chronic healthcare management needs, OSA, rhinitis, LBP with left sciatica, obesity, tobacco use, CAD, DM, fatigue, HF, tonsilitis, hepatomegaly  Care Plan : RN Care Manager Plan of Care  Updates made by Theresa Chandler, RN since 08/18/2023 12:00 AM     Problem: Health Promotion or Disease Self-Management (General Plan of Care)      Long-Range Goal: Chronic Disease Management   Start Date: 07/18/2023  Expected End Date: 10/16/2023  Priority: High  Note:   Current Barriers:  Knowledge Deficits related to plan of care for management of OSA, rhinitis, LBP with left sided sciatica, obesity, tobacco use, fatigue, HF Care Coordination needs related to OSA, rhinitis, LBP with left sided sciatica, obesity, tobacco use, fatigue, HF Chronic Disease Management support and education needs related to OSA, rhinitis, LBP with left sided sciatica, obesity, tobacco use, fatigue, HF Financial Constraints regarding not working-trying to get disability 08/18/23: Patient seen and evaluated by PCP 08/15/23-referrals placed to ENT and GI.  Patient has tonsilitis  and hepatomegaly. Smokes 1/2 ppd-not interested in stopping right now.  Needs dental and utility resources.     RNCM Clinical Goal(s):  verbalize understanding of plan for management of OSA, rhinitis, LBP with left sided sciatica, obesity, tobacco use, fatigue, HF as evidenced by  patient report verbalize basic understanding of  OSA, rhinitis, LBP with left sided sciatica, obesity, tobacco use, fatigue, HF disease process and self health management plan as evidenced by patient report. take all medications exactly as prescribed and will call provider for medication related questions as evidenced by patient report demonstrate understanding of rationale for each prescribed medication as evidenced by patient report attend all scheduled medical appointments as  evidenced by patient report and EMR review demonstrate ongoing adherence to prescribed treatment plan for OSA, rhinitis, LBP with left sided sciatica, obesity, tobacco use, fatigue, HF  as evidenced by patient report and EMR review.  continue to work with RN Care Manager to address care management and care coordination needs related to  OSA, rhinitis, LBP with left sided sciatica, obesity, tobacco use, fatigue, HF as evidenced by adherence to CM Team Scheduled appointments work with Child psychotherapist to address  related to the management of disability needs  related to the management of  OSA, rhinitis, LBP with left sided sciatica, obesity, tobacco use, fatigue, HF as evidenced by review of EMR and patient or Child psychotherapist report through collaboration with Medical illustrator, provider, and care team.   Interventions: Evaluation of current treatment plan related to  self management and patient's adherence to plan as established by provider Collaborated with BSW BSW referral for disability-completed 08/18/23-BSW referral for dental and utility resources  Heart Failure Interventions:  (Status:  New goal.) Long Term Goal Provided education on low sodium diet Discussed the importance of keeping all appointments with provider Assessed social determinant of health barriers   Smoking Cessation Interventions:  (Status:  New goal.) Long Term Goal Reviewed smoking history:   currently smoking 1/2 ppd  Evaluation of current treatment plan reviewed Reviewed scheduled/upcoming provider appointments  Provided contact information for [place] Quit Line (1-800-QUIT-NOW) Discussed plans with patient for ongoing care management follow up and provided patient with direct contact information for care management team Assessed social determinant of health barriers  Weight Loss Interventions:  (Status:  New goal.) Long Term Goal Advised patient to discuss with primary care provider options regarding weight management Offered  to connect patient with psychology or social work support for counseling and supportive care Reviewed recommended dietary changes: avoid fad diets, make small/incremental dietary and exercise changes, eat at the table and avoid eating in front of the TV, plan management of cravings, monitor snacking and cravings in food diary Assessed social determinant of health barriers   Patient Goals/Self-Care Activities: Take all medications as prescribed Attend all scheduled provider appointments Call pharmacy for medication refills 3-7 days in advance of running out of medications Perform all self care activities independently  Perform IADL's (shopping, preparing meals, housekeeping, managing finances) independently Call provider office for new concerns or questions  Work with the social worker to address care coordination needs and will continue to work with the clinical team to address health care and disease management related needs  Follow Up Plan:  The patient has been provided with contact information for the care management team and has been advised to call with any health related questions or concerns.  The care management team will reach out to the patient again over the next 30 business  days.   Long-Range Goal: Establish Plan of Care for Chronic Disease Management Needs   Priority: High  Note:   Timeframe:  Long-Range Goal Priority:  High Start Date:     07/18/23  Expected End Date:  ongoing                    Follow Up Date 09/15/23    - schedule appointment for vaccines needed due to my age or health - schedule recommended health tests (blood work, mammogram, colonoscopy, pap test) - schedule and keep appointment for annual check-up    Why is this important?   Screening tests can find diseases early when they are easier to treat.  Your doctor or nurse will talk with you about which tests are important for you.  Getting shots for common diseases like the flu and  shingles will help prevent them. 08/18/23: Patient seen by PCP 2/17.  Has GI appt 2/28 and ENT 4/9.     Follow Up:  Patient agrees to Care Plan and Follow-up.  Plan: The Managed Medicaid care management team will reach out to the patient again over the next 30 business  days. and The  Patient has been provided with contact information for the Managed Medicaid care management team and has been advised to call with any health related questions or concerns.  Date/time of next scheduled RN care management/care coordination outreach:  09/14/23 at [phone]

## 2023-08-18 NOTE — Patient Instructions (Signed)
Visit Information  Theresa Marquez was given information about Medicaid Managed Care team care coordination services as a part of their [facility] Community Plan Medicaid benefit. Theresa Marquez verbally consented to engagement with the [facility] Managed Care team.   If you are experiencing a medical emergency, please call 911 or report to your local emergency department or urgent care.   If you have a non-emergency medical problem during routine business hours, please contact your provider's office and ask to speak with a nurse.   For questions related to your [facility], please call: [phone] or visit the homepage here: [email or web address]  If you would like to schedule transportation through your [facility], please call the following number at least 2 days in advance of your appointment: [phone]   Rides for urgent appointments can also be made after hours by calling Member Services.  Call the Behavioral Health Crisis Line at [phone], at any time, 24 hours a day, 7 days a week. If you are in danger or need immediate medical attention call 911.  If you would like help to quit smoking, call 1-800-QUIT-NOW (778-802-3265) OR Espaol: 1-855-Djelo-Ya (6-301-601-0932) o para ms informacin haga clic aqu or Text READY to 355-732 to register via text  Theresa Marquez - following are the goals we discussed in your visit today:   Goals Addressed    Timeframe:  Long-Range Goal Priority:  High Start Date:     07/18/23                        Expected End Date:  ongoing                    Follow Up Date 09/15/23    - schedule appointment for vaccines needed due to my age or health - schedule recommended health tests (blood work, mammogram, colonoscopy, pap test) - schedule and keep appointment for annual check-up    Why is this important?   Screening tests can find diseases early  when they are easier to treat.  Your doctor or nurse will talk with you about which tests are important for you.  Getting shots for common diseases like the flu and shingles will help prevent them. 08/18/23: Patient seen by PCP 2/17.  Has GI appt 2/28 and ENT 4/9.    Patient verbalizes understanding of instructions and care plan provided today and agrees to view in MyChart. Active MyChart status and patient understanding of how to access instructions and care plan via MyChart confirmed with patient.     The Managed Medicaid care management team will reach out to the patient again over the next 30 business  days.  The  Patient  has been provided with contact information for the Managed Medicaid care management team and has been advised to call with any health related questions or concerns.   Kathi Der RN, BSN, [employer] [facility] RN Care Manager Direct Dial [phone]/BJS [phone] Website: [email or web address]  Following is a copy of your plan of care:  Care Plan : RN Care Manager Plan of Care  Updates made by Danie Chandler, RN since 08/18/2023 12:00 AM     Problem: Health Promotion or Disease Self-Management (General Plan of Care)      Long-Range Goal: Chronic Disease Management   Start Date: 07/18/2023  Expected End Date: 10/16/2023  Priority: High  Note:   Current Barriers:  Knowledge Deficits related to  plan of care for management of OSA, rhinitis, LBP with left sided sciatica, obesity, tobacco use, fatigue, HF Care Coordination needs related to OSA, rhinitis, LBP with left sided sciatica, obesity, tobacco use, fatigue, HF Chronic Disease Management support and education needs related to OSA, rhinitis, LBP with left sided sciatica, obesity, tobacco use, fatigue, HF Financial Constraints regarding not working-trying to get disability 08/18/23: Patient seen and evaluated by PCP 08/15/23-referrals placed to ENT and GI.  Patient has tonsilitis  and  hepatomegaly. Smokes 1/2 ppd-not interested in stopping right now.  Needs dental and utility resources.     RNCM Clinical Goal(s):  verbalize understanding of plan for management of OSA, rhinitis, LBP with left sided sciatica, obesity, tobacco use, fatigue, HF as evidenced by patient report verbalize basic understanding of  OSA, rhinitis, LBP with left sided sciatica, obesity, tobacco use, fatigue, HF disease process and self health management plan as evidenced by patient report. take all medications exactly as prescribed and will call provider for medication related questions as evidenced by patient report demonstrate understanding of rationale for each prescribed medication as evidenced by patient report attend all scheduled medical appointments as evidenced by patient report and EMR review demonstrate ongoing adherence to prescribed treatment plan for OSA, rhinitis, LBP with left sided sciatica, obesity, tobacco use, fatigue, HF  as evidenced by patient report and EMR review.  continue to work with RN Care Manager to address care management and care coordination needs related to  OSA, rhinitis, LBP with left sided sciatica, obesity, tobacco use, fatigue, HF as evidenced by adherence to CM Team Scheduled appointments work with Child psychotherapist to address  related to the management of disability needs  related to the management of  OSA, rhinitis, LBP with left sided sciatica, obesity, tobacco use, fatigue, HF as evidenced by review of EMR and patient or Child psychotherapist report through collaboration with Medical illustrator, provider, and care team.   Interventions: Evaluation of current treatment plan related to  self management and patient's adherence to plan as established by provider Collaborated with BSW BSW referral for disability-completed 08/18/23-BSW referral for dental and utility resources  Heart Failure Interventions:  (Status:  New goal.) Long Term Goal Provided education on low sodium  diet Discussed the importance of keeping all appointments with provider Assessed social determinant of health barriers   Smoking Cessation Interventions:  (Status:  New goal.) Long Term Goal Reviewed smoking history:   currently smoking 1/2 ppd  Evaluation of current treatment plan reviewed Reviewed scheduled/upcoming provider appointments  Provided contact information for [place] Quit Line (1-800-QUIT-NOW) Discussed plans with patient for ongoing care management follow up and provided patient with direct contact information for care management team Assessed social determinant of health barriers  Weight Loss Interventions:  (Status:  New goal.) Long Term Goal Advised patient to discuss with primary care provider options regarding weight management Offered to connect patient with psychology or social work support for counseling and supportive care Reviewed recommended dietary changes: avoid fad diets, make small/incremental dietary and exercise changes, eat at the table and avoid eating in front of the TV, plan management of cravings, monitor snacking and cravings in food diary Assessed social determinant of health barriers   Patient Goals/Self-Care Activities: Take all medications as prescribed Attend all scheduled provider appointments Call pharmacy for medication refills 3-7 days in advance of running out of medications Perform all self care activities independently  Perform IADL's (shopping, preparing meals, housekeeping, managing finances) independently Call provider office for new concerns  or questions  Work with the Child psychotherapist to address care coordination needs and will continue to work with the clinical team to address health care and disease management related needs  Follow Up Plan:  The patient has been provided with contact information for the care management team and has been advised to call with any health related questions or concerns.  The care management team will reach out  to the patient again over the next 30 business  days.

## 2023-08-26 ENCOUNTER — Ambulatory Visit: Payer: Medicaid Other | Admitting: Gastroenterology

## 2023-08-30 ENCOUNTER — Other Ambulatory Visit: Payer: Self-pay

## 2023-08-30 NOTE — Patient Instructions (Signed)
 Visit Information  Ms. Diclemente was given information about Medicaid Managed Care team care coordination services as a part of their [facility] Community Plan Medicaid benefit. [name] verbally consented to engagement with the [facility] Managed Care team.   If you are experiencing a medical emergency, please call 911 or report to your local emergency department or urgent care.   If you have a non-emergency medical problem during routine business hours, please contact your provider's office and ask to speak with a nurse.   For questions related to your [facility], please call: [phone] or visit the homepage here: [email or web address]  If you would like to schedule transportation through your [facility], please call the following number at least 2 days in advance of your appointment: [phone]   Rides for urgent appointments can also be made after hours by calling Member Services.  Call the Behavioral Health Crisis Line at [phone], at any time, 24 hours a day, 7 days a week. If you are in danger or need immediate medical attention call 911.  If you would like help to quit smoking, call 1-800-QUIT-NOW (408-166-4478) OR Espaol: 1-855-Djelo-Ya (0-272-536-6440) o para ms informacin haga clic aqu or Text READY to 347-425 to register via text  Ms. Parke Simmers - following are the goals we discussed in your visit today:   Goals Addressed   None       Social Worker will follow up. Abelino Derrick, MHA [place]  Managed [facility] Social Worker [phone]   Following is a copy of your plan of care:  There are no care plans that you recently modified to display for this patient.

## 2023-08-30 NOTE — Patient Outreach (Signed)
 Medicaid Managed Care Social Work Note  08/30/2023 Name:  Theresa Marquez MRN:  [id number] DOB:  02/18/1966  Theresa Marquez is an 58 y.o. year old female who is a primary patient of Theresa Gunner, MD.  The Medicaid Managed Care Coordination team was consulted for assistance with:  Community Resources   Theresa Marquez was given information about Medicaid Managed Care Coordination team services today. Theresa Marquez Patient agreed to services and verbal consent obtained.  Engaged with patient  for by telephone forfollow up visit in response to referral for case management and/or care coordination services.   Patient is participating in a Managed Medicaid Plan:  Yes  Assessments/Interventions:  Review of past medical history, allergies, medications, health status, including review of consultants reports, laboratory and other test data, was performed as part of comprehensive evaluation and provision of chronic care management services.  SDOH: (Social Drivers of Health) assessments and interventions performed: SDOH Interventions    Flowsheet Row Patient Outreach Telephone from 08/18/2023 in [place] HEALTH POPULATION HEALTH DEPARTMENT Patient Outreach Telephone from 07/18/2023 in [place] HEALTH POPULATION HEALTH DEPARTMENT Office Visit from 07/14/2023 in [facility] Visit from 08/31/2022 in [facility] [place] [facility] [place] ED to Hosp-Admission (Discharged) from 12/01/2021 in [facility] 3E HF PCU  SDOH Interventions       Food Insecurity Interventions -- -- -- -- Intervention Not Indicated  Housing Interventions -- -- -- -- Intervention Not Indicated  Transportation Interventions -- -- -- Other (Comment) Intervention Not Indicated  Utilities Interventions Intervention Not Indicated -- -- -- --  Alcohol Usage Interventions Intervention Not Indicated (Score <7) -- -- -- --  Depression Interventions/Treatment  -- --  Medication -- --  Financial Strain Interventions -- -- -- -- Intervention Not Indicated  Health Literacy Interventions -- Intervention Not Indicated -- -- --      BSW completed a telephone outreach with patient, she states she did not receive the utility resources BSW sent. Patient states she would also like transportation resources for getting to her appointments. BSW directed her to [facility] member services.   Advanced Directives Status:  Not addressed in this encounter.  Care Plan                 Allergies  Allergen Reactions   Codeine Other (See Comments)    Hallucinations  Hallucinates    Prednisone Swelling   Statins Other (See Comments)    Myalgias    Medications Reviewed Today   Medications were not reviewed in this encounter     Patient Active Problem List   Diagnosis Date Noted   Breast asymmetry in left breast on screeing mammogram 08/18/2023   Simple chronic bronchitis (HCC) 08/16/2023   Small airways disease 08/15/2023   Colon, diverticulosis 08/15/2023   Pulmonary artery hypertension (HCC) 08/15/2023   Coronary artery disease involving native coronary artery of native heart without angina pectoris 08/15/2023   Aortic atherosclerosis (HCC) 08/15/2023   Umbilical hernia without obstruction and without gangrene 08/15/2023   Tonsillitis and adenoiditis, chronic 08/15/2023   Hepatomegaly 08/15/2023   Metabolic dysfunction-associated steatotic liver disease (MASLD) 08/15/2023   Type 2 diabetes mellitus without complication, with long-term current use of insulin (HCC) 08/15/2023   Lymphadenopathy 07/15/2023   Positive colorectal cancer screening using Cologuard test 07/15/2023   Class 3 severe obesity due to excess calories with serious comorbidity and body mass index (BMI) of 45.0 to  49.9 in adult [facility]) 07/15/2023   Abdominal bloating 07/15/2023   Depression, major, single episode, moderate (HCC) 07/15/2023   Myalgia due to statin 07/14/2023   Pain in thoracic spine  06/07/2023   Paresthesia 06/07/2023   Sedimentation rate elevation 03/24/2023   Syncope 03/01/2023   Frequent falls 03/01/2023   Lumbar foraminal stenosis 03/01/2023   Small vessel disease (HCC) 03/01/2023   Closed fracture of tooth 03/01/2023   Neuralgia 12/10/2022   Rash of both hands 12/10/2022   Weakness of extremity 11/14/2022   Seizure-like activity (HCC) 11/14/2022   Cervicalgia 08/24/2022   Sleep disturbance 08/24/2022   Pruritic dermatitis 08/24/2022   Chronic midline low back pain with left-sided sciatica 02/25/2022   OSA (obstructive sleep apnea) 12/14/2021   AKI (acute kidney injury) (HCC) 12/14/2021   Wheezing 12/14/2021   Rhinitis 11/30/2021   Obesity, morbid, BMI 40.0-49.9 (HCC) 11/10/2021   Tobacco abuse 11/10/2021   Bilateral lower extremity edema 11/10/2021   Recurrent sinus infections 11/10/2021   Family history of thyroid disease 10/05/2021   Neck swelling 10/05/2021   Allergy 08/09/2017   Fatigue 12/24/2014    Conditions to be addressed/monitored per PCP order:   community resources  There are no care plans that you recently modified to display for this patient.   Follow up:  Patient agrees to Care Plan and Follow-up.  Plan: The Managed Medicaid care management team will reach out to the patient again over the next 30 days.   Abelino Derrick, MHA [facility]  Managed [facility] Social Worker [phone]

## 2023-08-31 ENCOUNTER — Other Ambulatory Visit: Payer: Medicaid Other

## 2023-09-07 ENCOUNTER — Ambulatory Visit
Admission: RE | Admit: 2023-09-07 | Discharge: 2023-09-07 | Disposition: A | Discharge status: Home / Self Care | Source: Ambulatory Visit | Attending: Family Medicine | Admitting: Family Medicine

## 2023-09-07 DIAGNOSIS — N6489 Other specified disorders of breast: Secondary | ICD-10-CM

## 2023-09-07 DIAGNOSIS — N6321 Unspecified lump in the left breast, upper outer quadrant: Secondary | ICD-10-CM | POA: Diagnosis not present

## 2023-09-09 ENCOUNTER — Other Ambulatory Visit: Payer: Self-pay | Admitting: Family Medicine

## 2023-09-09 DIAGNOSIS — N6321 Unspecified lump in the left breast, upper outer quadrant: Secondary | ICD-10-CM

## 2023-09-09 HISTORY — DX: Unspecified lump in the left breast, upper outer quadrant: N63.21

## 2023-09-09 IMAGING — CR DG CHEST 2V
2 series · 2 of 2 positions shown · non-contrast
Comparison: 11/30/2021.

CLINICAL DATA: Shortness of breath.

EXAM:
CHEST - 2 VIEW

[chest pa]
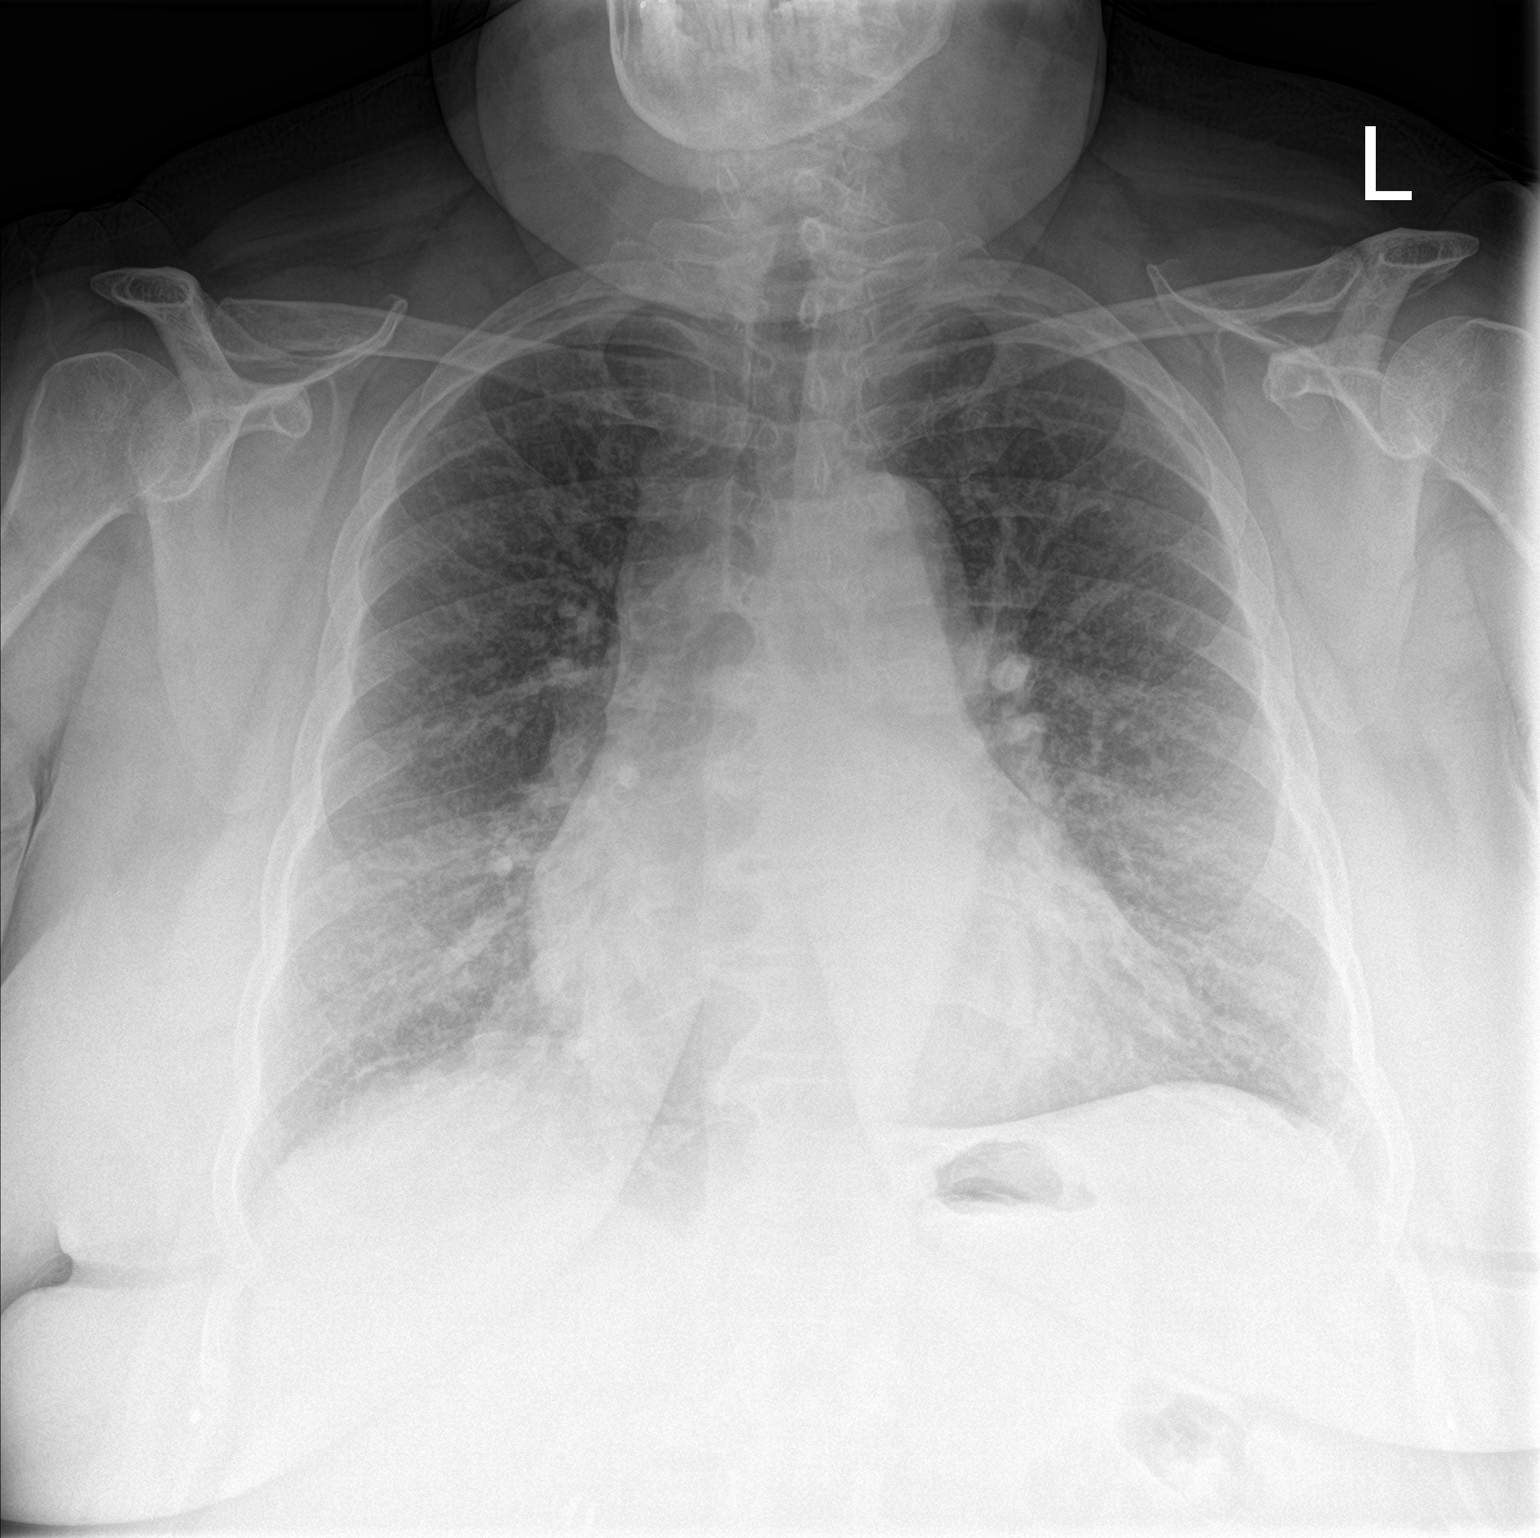

[chest lat]
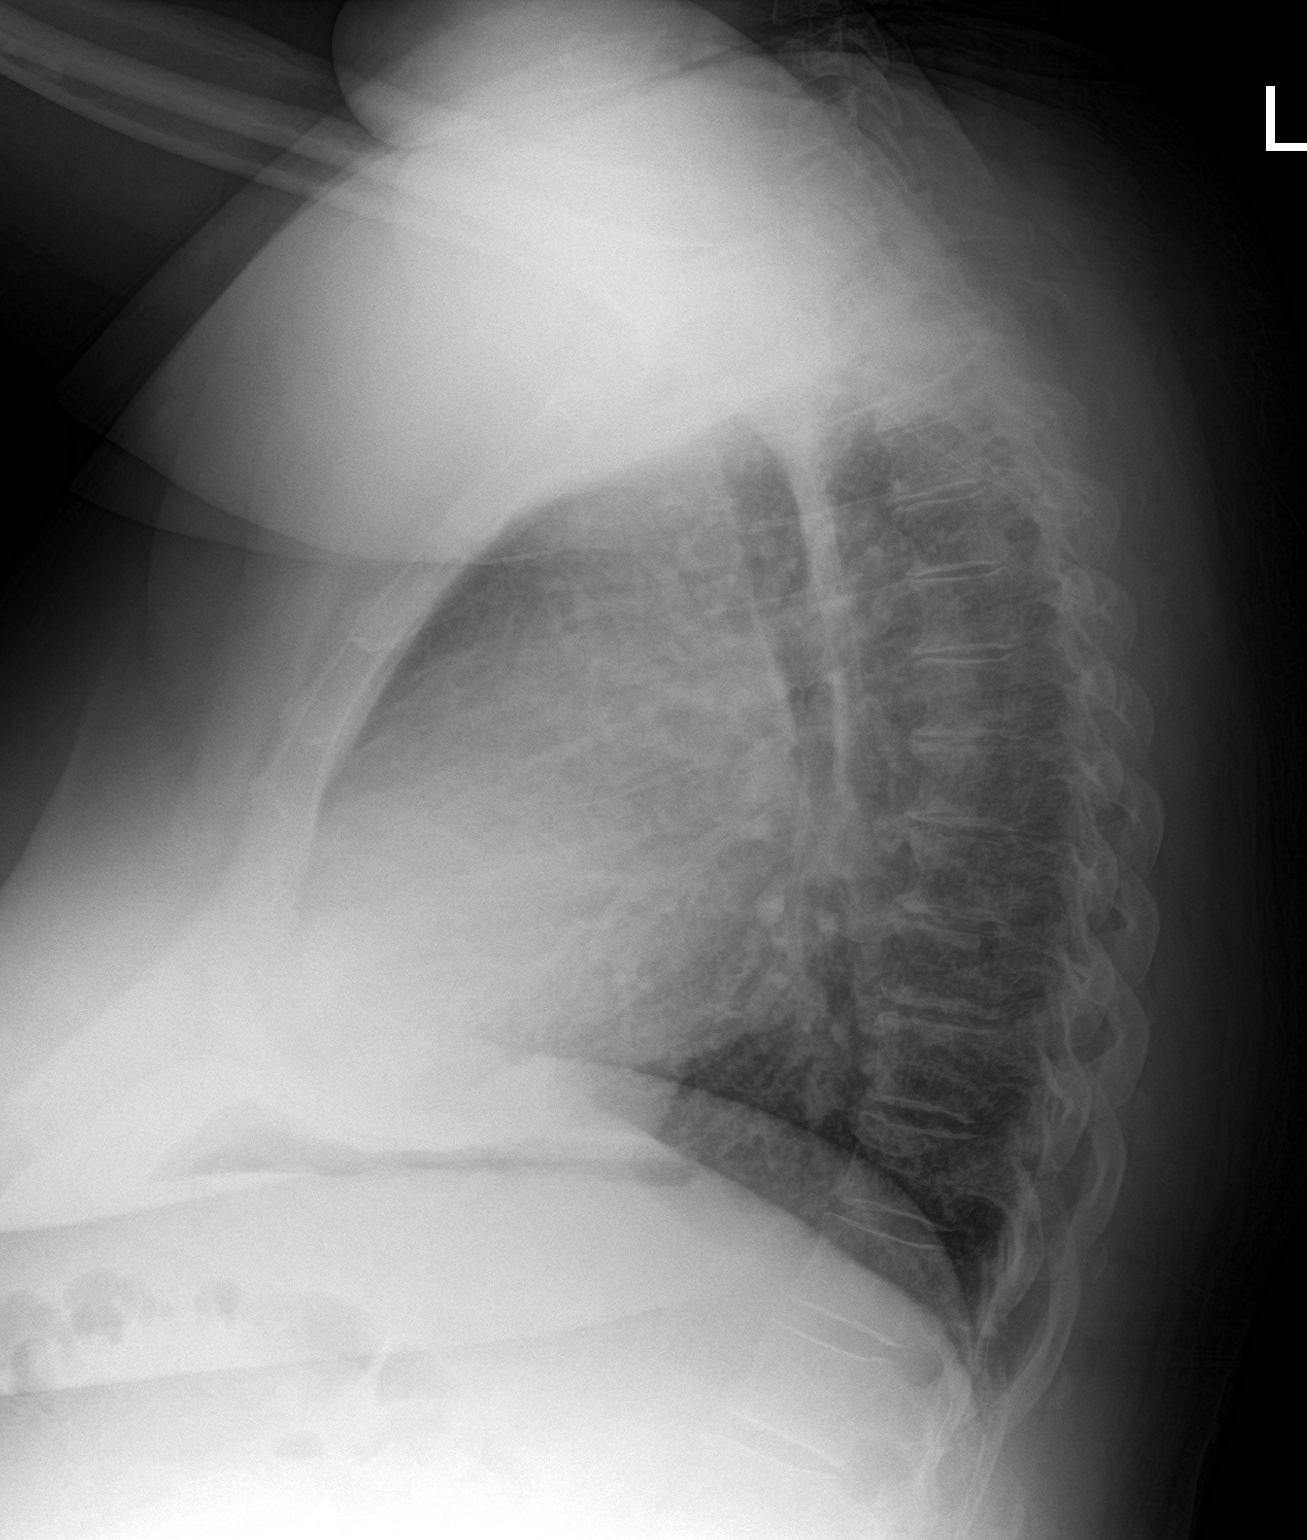

[2 of 2 positions shown; findings below may reference images not displayed]

FINDINGS: Heart is enlarged and the mediastinal contour is stable. The
pulmonary vasculature is mildly distended. Interstitial prominence
is noted bilaterally. No consolidation, effusion, or pneumothorax.
Mild degenerative changes are present in the thoracic spine.
IMPRESSION: 1. Cardiomegaly with pulmonary vascular congestion.
2. Interstitial prominence bilaterally possible edema or infiltrate.

## 2023-09-14 ENCOUNTER — Other Ambulatory Visit: Payer: Self-pay | Admitting: Obstetrics and Gynecology

## 2023-09-14 NOTE — Patient Instructions (Signed)
 Visit Information  Ms. Mccalister was given information about Medicaid Managed Care team care coordination services as a part of their [employer] [place] [employer] Medicaid benefit. [name] verbally consented to engagement with the [facility] Managed Care team.   If you are experiencing a medical emergency, please call 911 or report to your local emergency department or urgent care.   If you have a non-emergency medical problem during routine business hours, please contact your provider's office and ask to speak with a nurse.   For questions related to your [facility], please call: [phone] or visit the homepage here: [email or web address]  If you would like to schedule transportation through your [facility], please call the following number at least 2 days in advance of your appointment: [phone]   Rides for urgent appointments can also be made after hours by calling Member Services.  Call the Behavioral Health Crisis Line at [phone], at any time, 24 hours a day, 7 days a week. If you are in danger or need immediate medical attention call 911.  If you would like help to quit smoking, call 1-800-QUIT-NOW (905-029-0171) OR Espaol: 1-855-Djelo-Ya (5-643-329-5188) o para ms informacin haga clic aqu or Text READY to 416-606 to register via text  Ms. Parke Simmers - following are the goals we discussed in your visit today:   Goals Addressed    Timeframe:  Long-Range Goal Priority:  High Start Date:     07/18/23                        Expected End Date:  ongoing                    Follow Up Date 10/14/23    - schedule appointment for vaccines needed due to my age or health - schedule recommended health tests (blood work, mammogram, colonoscopy, pap test) - schedule and keep appointment for annual check-up    Why is this important?   Screening tests can find diseases early  when they are easier to treat.  Your doctor or nurse will talk with you about which tests are important for you.  Getting shots for common diseases like the flu and shingles will help prevent them. 09/13/23: ENT and GI appt in April.  Discussed PULM referral and pain management referral-patient to f/u with PCP office.   Patient verbalizes understanding of instructions and care plan provided today and agrees to view in MyChart. Active MyChart status and patient understanding of how to access instructions and care plan via MyChart confirmed with patient.     The Managed Medicaid care management team will reach out to the patient again over the next 30 business  days.  The  Patient  has been provided with contact information for the Managed Medicaid care management team and has been advised to call with any health related questions or concerns.   Kathi Der RNC, BSN, [employer] [facility] RN Care Manager Direct Dial [phone] Website: [email or web address]   Following is a copy of your plan of care:  Care Plan : RN Care Manager Plan of Care  Updates made by Danie Chandler, RN since 09/14/2023 12:00 AM     Problem: Health Promotion or Disease Self-Management (General Plan of Care)      Long-Range Goal: Chronic Disease Management   Start Date: 07/18/2023  Expected End Date: 10/16/2023  Priority: High  Note:   Current  Barriers:  Knowledge Deficits related to plan of care for management of OSA, rhinitis, LBP with left sided sciatica, obesity, tobacco use, fatigue, HF Care Coordination needs related to OSA, rhinitis, LBP with left sided sciatica, obesity, tobacco use, fatigue, HF Chronic Disease Management support and education needs related to OSA, rhinitis, LBP with left sided sciatica, obesity, tobacco use, fatigue, HF Financial Constraints regarding not working-trying to get disability 09/13/23: has received utility resources and states she will check  with insurance provider for dentists.  Smokes 1/2 ppd and not interested in stopping right now.  ENT and GI in April.  Patient to f/u on PULM referral and consider pain mgt referral-will ask PCP if she is interested in pursuing.  Has ongoing back and leg pain-has been evaluated by NEURO and ORTHO,   RNCM Clinical Goal(s):  verbalize understanding of plan for management of OSA, rhinitis, LBP with left sided sciatica, obesity, tobacco use, fatigue, HF as evidenced by patient report verbalize basic understanding of  OSA, rhinitis, LBP with left sided sciatica, obesity, tobacco use, fatigue, HF disease process and self health management plan as evidenced by patient report. take all medications exactly as prescribed and will call provider for medication related questions as evidenced by patient report demonstrate understanding of rationale for each prescribed medication as evidenced by patient report attend all scheduled medical appointments as evidenced by patient report and EMR review demonstrate ongoing adherence to prescribed treatment plan for OSA, rhinitis, LBP with left sided sciatica, obesity, tobacco use, fatigue, HF  as evidenced by patient report and EMR review.  continue to work with RN Care Manager to address care management and care coordination needs related to  OSA, rhinitis, LBP with left sided sciatica, obesity, tobacco use, fatigue, HF as evidenced by adherence to CM Team Scheduled appointments work with Child psychotherapist to address  related to the management of disability needs  related to the management of  OSA, rhinitis, LBP with left sided sciatica, obesity, tobacco use, fatigue, HF as evidenced by review of EMR and patient or Child psychotherapist report through collaboration with Medical illustrator, provider, and care team.   Interventions: Evaluation of current treatment plan related to  self management and patient's adherence to plan as established by provider Collaborated with BSW BSW referral  for disability-completed 08/18/23-BSW referral for dental and utility resources  Heart Failure Interventions:  (Status:  New goal.) Long Term Goal Provided education on low sodium diet Discussed the importance of keeping all appointments with provider Assessed social determinant of health barriers   Smoking Cessation Interventions:  (Status:  New goal.) Long Term Goal Reviewed smoking history:   currently smoking 1/2 ppd, not interested in stopping right now.    Evaluation of current treatment plan reviewed Reviewed scheduled/upcoming provider appointments  Provided contact information for [place] Quit Line (1-800-QUIT-NOW) Discussed plans with patient for ongoing care management follow up and provided patient with direct contact information for care management team Assessed social determinant of health barriers  Weight Loss Interventions:  (Status:  New goal.) Long Term Goal Advised patient to discuss with primary care provider options regarding weight management Offered to connect patient with psychology or social work support for counseling and supportive care Reviewed recommended dietary changes: avoid fad diets, make small/incremental dietary and exercise changes, eat at the table and avoid eating in front of the TV, plan management of cravings, monitor snacking and cravings in food diary Assessed social determinant of health barriers   Patient Goals/Self-Care Activities: Take all medications  as prescribed Attend all scheduled provider appointments Call pharmacy for medication refills 3-7 days in advance of running out of medications Perform all self care activities independently  Perform IADL's (shopping, preparing meals, housekeeping, managing finances) independently Call provider office for new concerns or questions  Work with the social worker to address care coordination needs and will continue to work with the clinical team to address health care and disease management related  needs 09/13/23:  patient to contact PCP office for PULM referral f/u and possible pain mgt referral.  Follow Up Plan:  The patient has been provided with contact information for the care management team and has been advised to call with any health related questions or concerns.  The care management team will reach out to the patient again over the next 30 business  days.

## 2023-09-14 NOTE — Patient Outreach (Signed)
 Medicaid Managed Care   Nurse Care Manager Note  09/14/2023 Name:  Theresa Marquez MRN:  [id number] DOB:  09-19-1965  Theresa Marquez is an 58 y.o. year old female who is a primary patient of Theresa Gunner, MD.  The [facility] Managed Care Coordination team was consulted for assistance with:    Chronic case management follow up, OSA, rhinitis, LBP with left sciatica, obesity, tobacco use, CAD, fatigue, HF, tonsilitis, hepatomegaly  Theresa Marquez was given information about Medicaid Managed Care Coordination team services today. Theresa Marquez Patient agreed to services and verbal consent obtained.  Engaged with patient by telephone for follow up visit in response to provider referral for case management and/or care coordination services.   Patient is participating in a Managed Medicaid Plan:  Yes  Assessments/Interventions:  Review of past medical history, allergies, medications, health status, including review of consultants reports, laboratory and other test data, was performed as part of comprehensive evaluation and provision of chronic care management services.  SDOH (Social Drivers of Health) assessments and interventions performed: SDOH Interventions    Flowsheet Row Patient Outreach Telephone from 09/14/2023 in Theresa Marquez POPULATION HEALTH DEPARTMENT Patient Outreach Telephone from 08/18/2023 in Theresa Marquez POPULATION HEALTH DEPARTMENT Patient Outreach Telephone from 07/18/2023 in Theresa Marquez HEALTH POPULATION HEALTH DEPARTMENT Office Visit from 07/14/2023 in [facility] Visit from 08/31/2022 in [facility] - A Dept Of Theresa Marquez. [facility] ED to Hosp-Admission (Discharged) from 12/01/2021 in [facility] 3E HF PCU  SDOH Interventions        Food Insecurity Interventions Intervention Not Indicated -- -- -- -- Intervention Not Indicated  Housing Interventions Intervention Not Indicated -- -- -- -- Intervention Not Indicated   Transportation Interventions -- -- -- -- Other (Comment) Intervention Not Indicated  Utilities Interventions -- Intervention Not Indicated -- -- -- --  Alcohol Usage Interventions -- Intervention Not Indicated (Score <7) -- -- -- --  Depression Interventions/Treatment  -- -- -- Medication -- --  Financial Strain Interventions -- -- -- -- -- Intervention Not Indicated  Health Literacy Interventions -- -- Intervention Not Indicated -- -- --     Care Plan Allergies  Allergen Reactions   Codeine Other (See Comments)    Hallucinations  Hallucinates    Prednisone Swelling   Statins Other (See Comments)    Myalgias    Medications Reviewed Today     Reviewed by Danie Chandler, RN (Registered Nurse) on 09/14/23 at 1526  Med List Status: <None>   Medication Order Taking? Sig Documenting Provider Last Dose Status Informant  esomeprazole (NEXIUM) 40 MG capsule [id number] No Take 1 capsule (40 mg total) by mouth daily. Theresa Gunner, MD Taking Active   furosemide (LASIX) 40 MG tablet [id number] No Take 1 tablet (40 mg total) by mouth daily. Baldo Daub, MD Taking Active   ibuprofen (ADVIL) 800 MG tablet [id number] No Take 1 tablet (800 mg total) by mouth every 8 (eight) hours as needed (pain). Theresa Gunner, MD Taking Active   ipratropium (ATROVENT) 0.06 % nasal spray [id number] No Place 2 sprays into both nostrils 2 (two) times daily. Theresa Gunner, MD Taking Expired 07/14/23 2359   levalbuterol (XOPENEX HFA) 45 MCG/ACT inhaler [id number] No Inhale 1-2 puffs into the lungs every 6 (six) hours as needed for wheezing. Theresa Gunner, MD Taking Active   pravastatin (PRAVACHOL) 20 MG tablet [id number]  Take 1 tablet (20 mg total)  by mouth daily. Theresa Gunner, MD  Active   Semaglutide,0.25 or 0.5MG /DOS, 2 MG/1.5ML Namon Cirri [id number]  Inject 0.5 mg into the skin once a week. Theresa Gunner, MD  Active   spironolactone (ALDACTONE) 25 MG tablet [id number] No Take 1 tablet (25 mg total)  by mouth daily. Theresa Gunner, MD Taking Active            Patient Active Problem List   Diagnosis Date Noted   Mass of upper outer quadrant of left breast 09/09/2023   Breast asymmetry in left breast on screeing mammogram 08/18/2023   Simple chronic bronchitis (HCC) 08/16/2023   Small airways disease 08/15/2023   Colon, diverticulosis 08/15/2023   Pulmonary artery hypertension (HCC) 08/15/2023   Coronary artery disease involving native coronary artery of native heart without angina pectoris 08/15/2023   Aortic atherosclerosis (HCC) 08/15/2023   Umbilical hernia without obstruction and without gangrene 08/15/2023   Tonsillitis and adenoiditis, chronic 08/15/2023   Hepatomegaly 08/15/2023   Metabolic dysfunction-associated steatotic liver disease (MASLD) 08/15/2023   Type 2 diabetes mellitus without complication, with long-term current use of insulin (HCC) 08/15/2023   Lymphadenopathy 07/15/2023   Positive colorectal cancer screening using Cologuard test 07/15/2023   Class 3 severe obesity due to excess calories with serious comorbidity and body mass index (BMI) of 45.0 to 49.9 in adult (HCC) 07/15/2023   Abdominal bloating 07/15/2023   Depression, major, single episode, moderate (HCC) 07/15/2023   Myalgia due to statin 07/14/2023   Pain in thoracic spine 06/07/2023   Paresthesia 06/07/2023   Sedimentation rate elevation 03/24/2023   Syncope 03/01/2023   Frequent falls 03/01/2023   Lumbar foraminal stenosis 03/01/2023   Small vessel disease (HCC) 03/01/2023   Closed fracture of tooth 03/01/2023   Neuralgia 12/10/2022   Rash of both hands 12/10/2022   Weakness of extremity 11/14/2022   Seizure-like activity (HCC) 11/14/2022   Cervicalgia 08/24/2022   Sleep disturbance 08/24/2022   Pruritic dermatitis 08/24/2022   Chronic midline low back pain with left-sided sciatica 02/25/2022   OSA (obstructive sleep apnea) 12/14/2021   AKI (acute kidney injury) (HCC) 12/14/2021    Wheezing 12/14/2021   Rhinitis 11/30/2021   Obesity, morbid, BMI 40.0-49.9 (HCC) 11/10/2021   Tobacco abuse 11/10/2021   Bilateral lower extremity edema 11/10/2021   Recurrent sinus infections 11/10/2021   Family history of thyroid disease 10/05/2021   Neck swelling 10/05/2021   Allergy 08/09/2017   Fatigue 12/24/2014   Conditions to be addressed/monitored per PCP order:  Chronic case management follow up, OSA, rhinitis, LBP with left sciatica, obesity, tobacco use, CAD, fatigue, HF, tonsilitis, hepatomegaly  Care Plan : RN Care Manager Plan of Care  Updates made by Danie Chandler, RN since 09/14/2023 12:00 AM     Problem: Health Promotion or Disease Self-Management (General Plan of Care)      Long-Range Goal: Chronic Disease Management   Start Date: 07/18/2023  Expected End Date: 10/16/2023  Priority: High  Note:   Current Barriers:  Knowledge Deficits related to plan of care for management of OSA, rhinitis, LBP with left sided sciatica, obesity, tobacco use, fatigue, HF Care Coordination needs related to OSA, rhinitis, LBP with left sided sciatica, obesity, tobacco use, fatigue, HF Chronic Disease Management support and education needs related to OSA, rhinitis, LBP with left sided sciatica, obesity, tobacco use, fatigue, HF Financial Constraints regarding not working-trying to get disability 09/13/23: has received utility resources and states she will check with insurance provider for dentists.  Smokes  1/2 ppd and not interested in stopping right now.  ENT and GI in April.  Patient to f/u on PULM referral and consider pain mgt referral-will ask PCP if she is interested in pursuing.  Has ongoing back and leg pain-has been evaluated by NEURO and ORTHO,   RNCM Clinical Goal(s):  verbalize understanding of plan for management of OSA, rhinitis, LBP with left sided sciatica, obesity, tobacco use, fatigue, HF as evidenced by patient report verbalize basic understanding of  OSA, rhinitis, LBP  with left sided sciatica, obesity, tobacco use, fatigue, HF disease process and self health management plan as evidenced by patient report. take all medications exactly as prescribed and will call provider for medication related questions as evidenced by patient report demonstrate understanding of rationale for each prescribed medication as evidenced by patient report attend all scheduled medical appointments as evidenced by patient report and EMR review demonstrate ongoing adherence to prescribed treatment plan for OSA, rhinitis, LBP with left sided sciatica, obesity, tobacco use, fatigue, HF  as evidenced by patient report and EMR review.  continue to work with RN Care Manager to address care management and care coordination needs related to  OSA, rhinitis, LBP with left sided sciatica, obesity, tobacco use, fatigue, HF as evidenced by adherence to CM Team Scheduled appointments work with Child psychotherapist to address  related to the management of disability needs  related to the management of  OSA, rhinitis, LBP with left sided sciatica, obesity, tobacco use, fatigue, HF as evidenced by review of EMR and patient or Child psychotherapist report through collaboration with Medical illustrator, provider, and care team.   Interventions: Evaluation of current treatment plan related to  self management and patient's adherence to plan as established by provider Collaborated with BSW BSW referral for disability-completed 08/18/23-BSW referral for dental and utility resources  Heart Failure Interventions:  (Status:  New goal.) Long Term Goal Provided education on low sodium diet Discussed the importance of keeping all appointments with provider Assessed social determinant of health barriers   Smoking Cessation Interventions:  (Status:  New goal.) Long Term Goal Reviewed smoking history:   currently smoking 1/2 ppd, not interested in stopping right now.    Evaluation of current treatment plan reviewed Reviewed  scheduled/upcoming provider appointments  Provided contact information for [place] [facility] [facility] ([email or web address]) Discussed plans with patient for ongoing care management follow up and provided patient with direct contact information for care management team Assessed social determinant of health barriers  Weight Loss Interventions:  (Status:  New goal.) Long Term Goal Advised patient to discuss with primary care provider options regarding weight management Offered to connect patient with psychology or social work support for counseling and supportive care Reviewed recommended dietary changes: avoid fad diets, make small/incremental dietary and exercise changes, eat at the table and avoid eating in front of the TV, plan management of cravings, monitor snacking and cravings in food diary Assessed social determinant of health barriers   Patient Goals/Self-Care Activities: Take all medications as prescribed Attend all scheduled provider appointments Call pharmacy for medication refills 3-7 days in advance of running out of medications Perform all self care activities independently  Perform IADL's (shopping, preparing meals, housekeeping, managing finances) independently Call provider office for new concerns or questions  Work with the social worker to address care coordination needs and will continue to work with the clinical team to address health care and disease management related needs 09/13/23:  patient to contact PCP office for PULM referral f/u and possible  pain mgt referral.  Follow Up Plan:  The patient has been provided with contact information for the care management team and has been advised to call with any health related questions or concerns.  The care management team will reach out to the patient again over the next 30 business  days   Long-Range Goal: Establish Plan of Care for Chronic Disease Management Needs   Priority: High  Note:   Timeframe:  Long-Range Goal Priority:  High Start  Date:     07/18/23                        Expected End Date:  ongoing                    Follow Up Date 10/14/23    - schedule appointment for vaccines needed due to my age or health - schedule recommended health tests (blood work, mammogram, colonoscopy, pap test) - schedule and keep appointment for annual check-up    Why is this important?   Screening tests can find diseases early when they are easier to treat.  Your doctor or nurse will talk with you about which tests are important for you.  Getting shots for common diseases like the flu and shingles will help prevent them. 09/13/23: ENT and GI appt in April.  Discussed PULM referral and pain management referral-patient to f/u with PCP office.    Follow Up:  Patient agrees to Care Plan and Follow-up.  Plan: The Managed Medicaid care management team will reach out to the patient again over the next 30 business  days. and The  Patient has been provided with contact information for the Managed Medicaid care management team and has been advised to call with any health related questions or concerns.  Date/time of next scheduled RN care management/care coordination outreach:  10/14/23 at 1.

## 2023-09-15 ENCOUNTER — Ambulatory Visit: Payer: Self-pay | Admitting: Obstetrics and Gynecology

## 2023-09-24 DIAGNOSIS — R0602 Shortness of breath: Secondary | ICD-10-CM | POA: Diagnosis not present

## 2023-09-24 DIAGNOSIS — J0140 Acute pansinusitis, unspecified: Secondary | ICD-10-CM | POA: Diagnosis not present

## 2023-10-05 ENCOUNTER — Encounter (INDEPENDENT_AMBULATORY_CARE_PROVIDER_SITE_OTHER): Payer: Self-pay | Admitting: Otolaryngology

## 2023-10-05 ENCOUNTER — Ambulatory Visit (INDEPENDENT_AMBULATORY_CARE_PROVIDER_SITE_OTHER): Payer: Medicaid Other | Admitting: Otolaryngology

## 2023-10-05 VITALS — BP 102/69 | HR 77 | Ht 64.0 in | Wt 255.0 lb

## 2023-10-05 DIAGNOSIS — K219 Gastro-esophageal reflux disease without esophagitis: Secondary | ICD-10-CM | POA: Diagnosis not present

## 2023-10-05 DIAGNOSIS — J351 Hypertrophy of tonsils: Secondary | ICD-10-CM

## 2023-10-05 DIAGNOSIS — J312 Chronic pharyngitis: Secondary | ICD-10-CM

## 2023-10-05 DIAGNOSIS — R0981 Nasal congestion: Secondary | ICD-10-CM | POA: Diagnosis not present

## 2023-10-05 DIAGNOSIS — J342 Deviated nasal septum: Secondary | ICD-10-CM

## 2023-10-05 DIAGNOSIS — L089 Local infection of the skin and subcutaneous tissue, unspecified: Secondary | ICD-10-CM

## 2023-10-05 DIAGNOSIS — F17210 Nicotine dependence, cigarettes, uncomplicated: Secondary | ICD-10-CM

## 2023-10-05 DIAGNOSIS — R053 Chronic cough: Secondary | ICD-10-CM | POA: Diagnosis not present

## 2023-10-05 DIAGNOSIS — J352 Hypertrophy of adenoids: Secondary | ICD-10-CM | POA: Diagnosis not present

## 2023-10-05 DIAGNOSIS — Z720 Tobacco use: Secondary | ICD-10-CM

## 2023-10-05 DIAGNOSIS — R09A2 Foreign body sensation, throat: Secondary | ICD-10-CM

## 2023-10-05 DIAGNOSIS — R0982 Postnasal drip: Secondary | ICD-10-CM

## 2023-10-05 DIAGNOSIS — J353 Hypertrophy of tonsils with hypertrophy of adenoids: Secondary | ICD-10-CM

## 2023-10-05 DIAGNOSIS — G4733 Obstructive sleep apnea (adult) (pediatric): Secondary | ICD-10-CM | POA: Diagnosis not present

## 2023-10-05 DIAGNOSIS — J343 Hypertrophy of nasal turbinates: Secondary | ICD-10-CM

## 2023-10-05 DIAGNOSIS — J3089 Other allergic rhinitis: Secondary | ICD-10-CM

## 2023-10-05 MED ORDER — MOMETASONE FUROATE 50 MCG/ACT NA SUSP
2.0000 | Freq: Every day | NASAL | 12 refills | Status: AC
Start: 2023-10-05 — End: ?

## 2023-10-05 MED ORDER — FAMOTIDINE 20 MG PO TABS
20.0000 mg | ORAL_TABLET | Freq: Two times a day (BID) | ORAL | 3 refills | Status: DC
Start: 2023-10-05 — End: 2023-10-05

## 2023-10-05 MED ORDER — FAMOTIDINE 20 MG PO TABS
20.0000 mg | ORAL_TABLET | Freq: Every day | ORAL | 3 refills | Status: DC
Start: 2023-10-05 — End: 2023-11-14

## 2023-10-05 MED ORDER — LEVOCETIRIZINE DIHYDROCHLORIDE 5 MG PO TABS: 5.0000 mg | ORAL_TABLET | Freq: Every evening | ORAL | 3 refills | Status: DC

## 2023-10-05 NOTE — Progress Notes (Unsigned)
 ENT CONSULT:  Reason for Consult: chronic tonsillitis    HPI: Discussed the use of AI scribe software for clinical note transcription with the patient, who gave verbal consent to proceed.  History of Present Illness Theresa Marquez "Waynetta Sandy" is a 58 year old female current smoker, hx of sleep apnea who presents with chronic cough and throat irritation.  She has experienced a chronic dry and violent cough for approximately two years, leading to episodes of syncope. There is a persistent sensation of a foreign body in her throat, which she attempts to expel without success. The cough has recently worsened, prompting her to temporarily cease smoking. Previous treatment with Augmentin for ten days was ineffective. She also experiences feverish and myalgic sensations, and the cough has resulted in muscle tears in her ribs and back.  She has a history of chronic tonsillitis, with a CT neck scan in January revealing inflamed tonsils and adenoids. She attributes her cough to throat issues rather than pulmonary issue. A previous diagnosis of COPD was made by a pulmonary doctor, although she has not had recent pulmonology follow-up/workup.  Her sleep apnea remains untreated due to repeated cancellations of CPAP setup appointments. She experiences sleep disturbances, particularly when symptoms of cough are severe.  She has a long history of smoking but has significantly reduced her smoking recently due to the worsening of her cough. She is currently taking Nexium for reflux. She uses a NeilMed bottle for nasal irrigation and has been prescribed allergy medication in the past, though she is not currently taking any.   Records Reviewed:  Visit at [facility] 09/24/23  58 y/o female comes in c/o flu like symptoms for the past 2 weeks. Pt has been taking mucinex without any relief.   Take the Augmentin 1 pill twice a day for the next 10 days. You may use Tylenol and ibuprofen as needed for pain relief and fever  reduction. Use a saline nasal spray to help with nasal congestion. Use your albuterol inhaler as needed for cough, shortness of breath, and wheezing.    Past Medical History:  Diagnosis Date   Allergy    Endometrial polyp 08/09/2017   Formatting of this note might be different from the original. S/p hysteroscopy with [facility] 10/2017 with Dr Elmore Guise, was benign    Past Surgical History:  Procedure Laterality Date   DG GALL BLADDER Right     Family History  Problem Relation Age of Onset   Kidney disease Mother    Cancer Father    Cerebral palsy Daughter    Breast cancer Neg Hx     Social History:  reports that she has been smoking cigarettes. She started smoking about 53 years ago. She has a 13.5 pack-year smoking history. She has never used smokeless tobacco. She reports that she does not currently use alcohol. She reports that she does not use drugs.  Allergies:  Allergies  Allergen Reactions   Codeine Other (See Comments)    Hallucinations  Hallucinates    Dietary Management Product Other (See Comments)    Breaks out in hives when drinking diet soda   Prednisone Swelling   Statins Other (See Comments)    Myalgias    Medications: I have reviewed the patient's current medications.  The PMH, PSH, Medications, Allergies, and SH were reviewed and updated.  ROS: Constitutional: Negative for fever, weight loss and weight gain. Cardiovascular: Negative for chest pain and dyspnea on exertion. Respiratory: Is not experiencing shortness of breath at rest. Gastrointestinal:  Negative for nausea and vomiting. Neurological: Negative for headaches. Psychiatric: The patient is not nervous/anxious  Blood pressure 102/69, pulse 77, height 5\' 4"  (1.626 m), weight 255 lb (115.7 kg), SpO2 96%. Body mass index is 43.77 kg/m.  PHYSICAL EXAM:  Exam: General: Well-developed, well-nourished Communication and Voice: raspy Respiratory Respiratory effort: Equal inspiration and expiration  without stridor Cardiovascular Peripheral Vascular: Warm extremities with equal color/perfusion Eyes: No nystagmus with equal extraocular motion bilaterally Neuro/Psych/Balance: Patient oriented to person, place, and time; Appropriate mood and affect; Gait is intact with no imbalance; Cranial nerves I-XII are intact Head and Face Inspection: Normocephalic and atraumatic without mass or lesion Palpation: Facial skeleton intact without bony stepoffs Salivary Glands: No mass or tenderness Facial Strength: Facial motility symmetric and full bilaterally ENT Pinna: External ear intact and fully developed External canal: Canal is patent with intact skin Tympanic Membrane: Clear and mobile External Nose: No scar or anatomic deformity Internal Nose: Septum is deviated to the right. No polyp, or purulence. Mucosal edema and erythema present.  Bilateral inferior turbinate hypertrophy.  Lips, Teeth, and gums: Mucosa and teeth intact and viable TMJ: No pain to palpation with full mobility Oral cavity/oropharynx: No erythema or exudate, no lesions present tonsils 2+ b/l Nasopharynx: No mass or lesion with intact mucosa Adenoid hypertrophy Hypopharynx: Intact mucosa without pooling of secretions Larynx Glottic: Full true vocal cord mobility without lesion or mass mild VF edema, supraglottic compression with exam difficult to fully evaluate surface of VF Supraglottic: Normal appearing epiglottis and AE folds Interarytenoid Space: severe pachydermia&edema Subglottic Space: Patent without lesion or edema Neck Neck and Trachea: Midline trachea without mass or lesion Thyroid: No mass or nodularity Lymphatics: No lymphadenopathy  Procedure: Preoperative diagnosis: sore throat and chronic cough hx of smoking   Postoperative diagnosis:   Same + GERD LPR + PND  Procedure: Flexible fiberoptic laryngoscopy  Surgeon: Ashok Croon, MD  Anesthesia: Topical lidocaine and Afrin Complications:  None Condition is stable throughout exam  Indications and consent:  The patient presents to the clinic with above symptoms. Indirect laryngoscopy view was incomplete. Thus it was recommended that they undergo a flexible fiberoptic laryngoscopy. All of the risks, benefits, and potential complications were reviewed with the patient preoperatively and verbal informed consent was obtained.  Procedure: The patient was seated upright in the clinic. Topical lidocaine and Afrin were applied to the nasal cavity. After adequate anesthesia had occurred, I then proceeded to pass the flexible telescope into the nasal cavity. The nasal cavity was patent without rhinorrhea or polyp. The nasopharynx was also patent without mass or lesion. The base of tongue was visualized and was normal. There were no signs of pooling of secretions in the piriform sinuses. The true vocal folds were mobile bilaterally. There were no signs of glottic or supraglottic mucosal lesion or mass. There was severe interarytenoid pachydermia and post cricoid edema. The telescope was then slowly withdrawn and the patient tolerated the procedure throughout.    PROCEDURE NOTE: nasal endoscopy  Preoperative diagnosis: chronic nasal congestion symptoms  Postoperative diagnosis: same  Procedure: Diagnostic nasal endoscopy (95621)  Surgeon: Ashok Croon, M.D.  Anesthesia: Topical lidocaine and Afrin  H&P REVIEW: The patient's history and physical were reviewed today prior to procedure. All medications were reviewed and updated as well. Complications: None Condition is stable throughout exam Indications and consent: The patient presents with symptoms of chronic sinusitis not responding to previous therapies. All the risks, benefits, and potential complications were reviewed with the patient preoperatively and  informed consent was obtained. The time out was completed with confirmation of the correct procedure.   Procedure: The patient was  seated upright in the clinic. Topical lidocaine and Afrin were applied to the nasal cavity. After adequate anesthesia had occurred, the rigid nasal endoscope was passed into the nasal cavity. The nasal mucosa, turbinates, septum, and sinus drainage pathways were visualized bilaterally. This revealed no purulence or significant secretions that might be cultured. There were no polyps or sites of significant inflammation. The mucosa was intact and there was no crusting present. The scope was then slowly withdrawn and the patient tolerated the procedure well. There were no complications or blood loss.   Studies Reviewed: CT soft tissue neck 07/29/23 FINDINGS: Pharynx and larynx: The glottis is closed now. Generalized pharyngeal and tonsillar hypertrophy again noted but does not appear significantly changed since the 2019 CT. There are chronic postinflammatory dystrophic calcifications of the palatine tonsils. No inflammation in the parapharyngeal or retropharyngeal spaces. Incidental chronic retropharyngeal course of both carotid arteries, normal variant.   No laryngeal or pharyngeal mass or hyperenhancement identified.   Salivary glands: Negative.  Negative sublingual space.   Thyroid: Negative.   Lymph nodes: Bilateral cervical lymph nodes are subcentimeter, and are generally regressed from the 2019 CT. No enlarged or heterogeneous lymph nodes. No cervical lymphadenopathy.   Vascular: Retropharyngeal course of both carotid arteries, normal variant. The major vascular structures in the neck and at the skull base are enhancing and appear to be patent. Mild for age cervical carotid atherosclerosis.   Limited intracranial: Negative.   Visualized orbits: Negative.   Mastoids and visualized paranasal sinuses: Visualized paranasal sinuses and mastoids are stable and well aerated.   Skeleton: Occasional dental caries. Ordinary cervical spine degeneration. No acute or suspicious osseous  abnormality identified.   Upper chest: Reported separately.  Calcified aortic atherosclerosis.   IMPRESSION: 1. No acute or inflammatory process identified in the Neck. No mass or lymphadenopathy. Chronic generalized pharyngeal tonsillar/adenoid hypertrophy is not significantly changed from 2019.  CT chest 07/29/23 IMPRESSION: 1. Mild heterogeneous pulmonary parenchyma, can be seen with small airways disease. 2. Dilated main pulmonary artery, suggestive of pulmonary arterial hypertension. 3. Coronary artery calcifications. Aortic Atherosclerosis  Assessment/Plan: Encounter Diagnoses  Name Primary?   Tobacco abuse    Chronic sore throat    Chronic cough Yes   Globus sensation    Chronic GERD    Chronic nasal congestion    Environmental and seasonal allergies    Hypertrophy of both inferior nasal turbinates    Nasal septal deviation    Post-nasal drip    OSA (obstructive sleep apnea)    Adenoid hypertrophy    Tonsillar hypertrophy     Assessment and Plan Assessment & Plan Chronic Cough Chronic cough due to postnasal drainage and reflux-induced laryngeal inflammation vs pulmonary vs (hx of smoking). Smoking and reflux are significant contributors based on exam, she had significant nasal congestion which could be 2/2 smoking vs environmental allergies, and her flexible scope exam showed severe post-cricoid edema/pachydermia c/w GERD LOR - Prescribe Xyzal for allergies. - Prescribe Nasonex for nasal inflammation. - Continue Nexium 30 minutes before breakfast. - Prescribe Pepcid for nighttime use. - Recommend Reflux Gourmet postprandially, especially after dinner. - Advise gargling with saline. - Advise nasal irrigation with saline using NeilMed bottle.  Chronic Tonsillitis Tonsillar and Adenoid Hypertrophy Chronic tonsillitis with inflamed tonsils and adenoids. Tonsillectomy considered if other treatments fail. - medical management of GERD and post-nasal drainage as  above -  gargle with salt water - Consider tonsillectomy if other treatments fail.  Obstructive Sleep Apnea Obstructive sleep apnea untreated due to appointment cancellations. CPAP therapy necessary despite potential side effects 2/2 diagnosis of OSA on sleep study - Refer to [facility] for CPAP management.  Pulmonary Arterial Hypertension and Small Airway Disease on CT chest  Pulmonary arterial hypertension and small airway disease. Referral to pulmonology necessary for comprehensive management. - Refer to pulmonology for further evaluation.  Smoking Cessation Long history of smoking contributing to postnasal drainage and throat irritation. Smoking cessation crucial for symptom improvement. Spent 3 min counseling. - Provided information on [employer] for smoking cessation.  Superficial Skin Infection Superficial skin infection around nose and eyes, possibly related to vitamin deficiency. - Prescribe antibiotic ointment for skin infection. This could either be superficial skin infection vs cheilosis   Follow-up Follow-up planned to assess response to treatments and referrals. - Schedule follow-up appointment in four months.      Thank you for allowing me to participate in the care of this patient. Please do not hesitate to contact me with any questions or concerns.   Ashok Croon, MD Otolaryngology [facility] Phone: [phone] Fax: [phone]    10/06/2023, 10:08 AM

## 2023-10-05 NOTE — Patient Instructions (Addendum)
 GamingLesson.[email or web address] - check out this website to learn more about reflux   -Avoid lying down for at least two hours after a meal or after drinking acidic beverages, like soda, or other caffeinated beverages. This can help to prevent stomach contents from flowing back into the esophagus. -Keep your head elevated while you sleep. Using an extra pillow or two can also help to prevent reflux. -Eat smaller and more frequent meals each day instead of a few large meals. This promotes digestion and can aid in preventing heartburn. -Wear loose-fitting clothes to ease pressure on the stomach, which can worsen heartburn and reflux. -Reduce excess weight around the midsection. This can ease pressure on the stomach. Such pressure can force some stomach contents back up the esophagus - Take Reflux Gourmet (natural supplement available on Amazon) to help with symptoms of chronic throat irritation     Lloyd Huger Med Nasal Saline Rinse   - start nasal saline rinses with NeilMed Bottle available over the counter or online to help with nasal congestion      Dear Theresa Marquez,   Congratulations for your interest in quitting smoking!  Find a program that suits you best: when you want to quit, how you need support, where you live, and how you like to learn.    If you're ready to get started TODAY, consider scheduling a visit through [email or web address].  Appointments are available from 8am to 8pm, Monday to Friday.   Most health insurance plans will cover some level of tobacco cessation visits and medications.    Additional Resources: [employer] are also available to help you quit & provide the support you'll need. Many programs are available in both [place] and Spanish and have a long history of successfully helping people get off and stay off tobacco.    Quit Smoking Apps:  quitSTART at SeriousBroker.de QuitGuide?at ForgetParking.dk Online  education and resources: Smokefree  at [employer].gov Free Telephone Coaching: QuitNow,  Call 1-800-QUIT-NOW ((639)712-5700) or Text- Ready to 705 414 7011 *[facility] [place] has teamed up with Medicaid to offer a free 14 week program    Vaping- Want to Quit? Free 24/7 support. Call [facility]  [place], [place], [place], [place], [place]  [facility]

## 2023-10-06 ENCOUNTER — Encounter (INDEPENDENT_AMBULATORY_CARE_PROVIDER_SITE_OTHER): Payer: Self-pay

## 2023-10-06 MED ORDER — BACITRACIN 500 UNIT/GM EX OINT
1.0000 | TOPICAL_OINTMENT | Freq: Two times a day (BID) | CUTANEOUS | 0 refills | Status: DC
Start: 2023-10-06 — End: 2024-04-23

## 2023-10-11 ENCOUNTER — Telehealth (INDEPENDENT_AMBULATORY_CARE_PROVIDER_SITE_OTHER): Payer: Self-pay

## 2023-10-11 MED ORDER — AZELASTINE HCL 0.1 % NA SOLN: 2.0000 | Freq: Two times a day (BID) | NASAL | 12 refills | Status: AC

## 2023-10-11 NOTE — Addendum Note (Signed)
 Addended by: Lafonda Patron on: 10/11/2023 12:22 PM   Modules accepted: Orders

## 2023-10-11 NOTE — Telephone Encounter (Signed)
 Spoke to pharmacy about the prior auth for nasal spray, per pharmacy since the patient has medicaid they will only cover Flonase nasal spray

## 2023-10-13 ENCOUNTER — Ambulatory Visit: Payer: Medicaid Other | Admitting: Gastroenterology

## 2023-10-13 ENCOUNTER — Encounter: Payer: Self-pay | Admitting: Gastroenterology

## 2023-10-13 VITALS — BP 118/72 | HR 101 | Ht 64.0 in | Wt 242.0 lb

## 2023-10-13 DIAGNOSIS — R195 Other fecal abnormalities: Secondary | ICD-10-CM | POA: Diagnosis not present

## 2023-10-13 DIAGNOSIS — R053 Chronic cough: Secondary | ICD-10-CM

## 2023-10-13 DIAGNOSIS — R490 Dysphonia: Secondary | ICD-10-CM | POA: Insufficient documentation

## 2023-10-13 DIAGNOSIS — K760 Fatty (change of) liver, not elsewhere classified: Secondary | ICD-10-CM | POA: Diagnosis not present

## 2023-10-13 DIAGNOSIS — R110 Nausea: Secondary | ICD-10-CM

## 2023-10-13 HISTORY — DX: Dysphonia: R49.0

## 2023-10-13 HISTORY — DX: Chronic cough: R05.3

## 2023-10-13 HISTORY — DX: Nausea: R11.0

## 2023-10-13 NOTE — Patient Instructions (Signed)
 You have been scheduled for an endoscopy and colonoscopy. Please follow the written instructions given to you at your visit today.  If you use inhalers (even only as needed), please bring them with you on the day of your procedure.  DO NOT TAKE 7 DAYS PRIOR TO TEST- Trulicity (dulaglutide) Ozempic, Wegovy (semaglutide) Mounjaro (tirzepatide) Bydureon Bcise (exanatide extended release)  DO NOT TAKE 1 DAY PRIOR TO YOUR TEST Rybelsus (semaglutide) Adlyxin (lixisenatide) Victoza (liraglutide) Byetta (exanatide) __________________________________________________________________ _______________________________________________________  If your blood pressure at your visit was 140/90 or greater, please contact your primary care physician to follow up on this.  _______________________________________________________  If you are age 14 or older, your body mass index should be between 23-30. Your Body mass index is 41.54 kg/m. If this is out of the aforementioned range listed, please consider follow up with your Primary Care Provider.  If you are age 7 or younger, your body mass index should be between 19-25. Your Body mass index is 41.54 kg/m. If this is out of the aformentioned range listed, please consider follow up with your Primary Care Provider.   ________________________________________________________  The [facility] providers would like to encourage you to use MYCHART to communicate with providers for non-urgent requests or questions.  Due to long hold times on the telephone, sending your provider a message by [facility] may be a faster and more efficient way to get a response.  Please allow 48 business hours for a response.  Please remember that this is for non-urgent requests.  _______________________________________________________

## 2023-10-13 NOTE — Progress Notes (Signed)
 10/13/2023 Theresa Marquez [id number] 1966-05-31   HISTORY OF PRESENT ILLNESS: This is a 58 year old female who was referred to our office by her PCP, Dr. Janee Morn, for evaluation in regards to a positive Cologuard study.  She performed this back in October.  Has never had a colonoscopy in the past.  Says that she moves her bowels regularly for the most part, sometimes has some constipation, but not a major issue.  Denies any rectal bleeding.  While she is here today though she mentions that she has been seeing a lot of physicians over the past couple years.  Has a lot of diffuse generalized body pain and extreme fatigue.  Has had persistently elevated white blood cell count as well.  Has been trying to figure that out, but she has really had no answers.  She also complains of a lot of daily nausea.  Occasional abdominal bloating.  She reports a lot of hoarseness in her voice with throat clearing and chronic cough.  She says that she saw ENT and they told her they thought it was due to acid reflux.  She has been on Nexium 40 mg daily recently by her PCP to see if that would help with her abdominal bloating.  She does not feel like she gets heartburn/reflux.  She has never had an EGD in the past.  CT scan of the abdomen and pelvis without contrast showed hepatomegaly with hepatic steatosis.  LFTs are normal.  Platelet count is normal.  It also showed moderate colonic stool burden.  Once again she does not feel like constipation is a significant issue for her and for the most part she moves her bowels regularly and daily.   Past Medical History:  Diagnosis Date   Allergy    Endometrial polyp 08/09/2017   Formatting of this note might be different from the original. S/p hysteroscopy with [facility] 10/2017 with Dr Elmore Guise, was benign   Past Surgical History:  Procedure Laterality Date   DG GALL BLADDER Right     reports that she has been smoking cigarettes. She started smoking about 54 years ago. She has  a 13.5 pack-year smoking history. She has never used smokeless tobacco. She reports that she does not currently use alcohol. She reports that she does not use drugs. family history includes Cancer in her father; Cerebral palsy in her daughter; Kidney disease in her mother. Allergies  Allergen Reactions   Codeine Other (See Comments)    Hallucinations  Hallucinates    Dietary Management Product Other (See Comments)    Breaks out in hives when drinking diet soda   Prednisone Swelling   Statins Other (See Comments)    Myalgias      Outpatient Encounter Medications as of 10/13/2023  Medication Sig   amoxicillin-clavulanate (AUGMENTIN) 875-125 MG tablet SMARTSIG:1 Tablet(s) By Mouth Every 12 Hours   azelastine (ASTELIN) 0.1 % nasal spray Place 2 sprays into both nostrils 2 (two) times daily. Use in each nostril as directed   bacitracin 500 UNIT/GM ointment Apply 1 Application topically 2 (two) times daily. Apply to affected area BID x 10 days then stop   esomeprazole (NEXIUM) 40 MG capsule Take 1 capsule (40 mg total) by mouth daily.   famotidine (PEPCID) 20 MG tablet Take 1 tablet (20 mg total) by mouth at bedtime.   furosemide (LASIX) 40 MG tablet Take 1 tablet (40 mg total) by mouth daily.   ibuprofen (ADVIL) 800 MG tablet Take 1 tablet (800 mg  total) by mouth every 8 (eight) hours as needed (pain).   levalbuterol (XOPENEX HFA) 45 MCG/ACT inhaler Inhale 1-2 puffs into the lungs every 6 (six) hours as needed for wheezing.   levocetirizine (XYZAL ALLERGY 24HR) 5 MG tablet Take 1 tablet (5 mg total) by mouth every evening.   mometasone (NASONEX) 50 MCG/ACT nasal spray Place 2 sprays into the nose daily.   pravastatin (PRAVACHOL) 20 MG tablet Take 1 tablet (20 mg total) by mouth daily.   Semaglutide,0.25 or 0.5MG /DOS, 2 MG/1.5ML SOPN Inject 0.5 mg into the skin once a week.   spironolactone (ALDACTONE) 25 MG tablet Take 1 tablet (25 mg total) by mouth daily.   ipratropium (ATROVENT) 0.06 %  nasal spray Place 2 sprays into both nostrils 2 (two) times daily.   No facility-administered encounter medications on file as of 10/13/2023.    REVIEW OF SYSTEMS  : All other systems reviewed and negative except where noted in the History of Present Illness.   PHYSICAL EXAM: BP 118/72   Pulse (!) 101   Ht 5\' 4"  (1.626 m)   Wt 242 lb (109.8 kg)   LMP  (LMP Unknown)   BMI 41.54 kg/m  General: Well developed white female in no acute distress Head: Normocephalic and atraumatic Eyes:  sclerae anicteric,conjunctive pink. Ears: Normal auditory acuity Lungs: Clear throughout to auscultation Heart: Regular rate and rhythm Abdomen: Soft, nontender, non distended. No masses or hepatomegaly noted. Normal bowel sounds Rectal:  Will be done at the time of colonoscopy. Musculoskeletal: Symmetrical with no gross deformities  Skin: No lesions on visible extremities Extremities: No edema  Neurological: Alert oriented x 4, grossly non-focal Psychological:  Alert and cooperative. Normal mood and affect  ASSESSMENT AND PLAN: *Positive Cologuard: This was performed October 2024.  Has never had a colonoscopy in the past.  Will schedule colonoscopy with Dr. Brice Campi. *Nausea and chronic cough/hoarseness: Does not feel like she has heartburn/reflux, but PCP recently placed her on Nexium 40 mg daily.  Has seen ENT and they told her that they thought she had reflux.  Will plan for EGD as well.  Continue the Nexium 40 mg daily.  Question if some of the nausea could be due to her semaglutide. *Hepatic steatosis seen on CT scan: Discussed good diet, exercise, weight loss, good blood sugar and cholesterol control.  Low fib 4 score.  **The risks, benefits, and alternatives to EGD and colonoscopy were discussed with the patient and she consents to proceed.   CC:  Catheryn Cluck, MD

## 2023-10-14 ENCOUNTER — Other Ambulatory Visit: Payer: Self-pay

## 2023-10-14 NOTE — Patient Instructions (Signed)
 Visit Information  Thank you for taking time to visit with me today. Please don't hesitate to contact me if I can be of assistance to you before our next scheduled appointment.  Our next appointment is by telephone on 10/31/23 at 3:00 Please call the care guide team at [phone] if you need to cancel or reschedule your appointment.   Following is a copy of your care plan:   Goals Addressed             This Visit's Progress    VBCI RN Care Plan       Problems:  Chronic Disease Management support and education needs related to CAD, DMII, and HTN  Goal: Over the next 3 months the Patient will attend all scheduled medical appointments: 11/08/23 EGD/Colonoscopy, 11/14/23 PCP  as evidenced by patient report and chart review        continue to work with RN Care Manager and/or Social Worker to address care management and care coordination needs related to CAD, DMII, and HTN as evidenced by adherence to care management team scheduled appointments     demonstrate a decrease CAD, DMII, and HTN in exacerbations as evidenced by patient report and chart review  Interventions:   Evaluation of current treatment plan related to CAD, DMII, and HTN,  medical treatment  self-management and patient's adherence to plan as established by provider. Discussed plans with patient for ongoing care management follow up and provided patient with direct contact information for care management team Evaluation of current treatment plan related to current symptoms of self reported chronic pain, "all over body swelling" and patient's adherence to plan as established by provider Advised patient to call [facility] to schedule appointment Reviewed medications with patient and discussed rationale for use Reviewed scheduled/upcoming provider appointments including PCP and speciality providers as well as upcoming scheduled procedures. Discussed plans with patient for ongoing care management follow up and provided  patient with direct contact information for care management team Advised patient to discuss unresolved symptoms with provider  Patient Self-Care Activities:  Attend all scheduled provider appointments Call pharmacy for medication refills 3-7 days in advance of running out of medications Call provider office for new concerns or questions  Perform all self care activities independently  Take medications as prescribed    Plan:  Telephone follow up appointment with care management team member scheduled for:  05/025/25 at 3:00   Clarnce Crow BSN RN CCM [place]  [facility], [facility] RN Care Manager Direct Dial: [phone] Fax: [phone]             Please call the Suicide and Crisis Lifeline: 988 call the [place]  National Suicide Prevention Lifeline: 980-499-9615 or TTY: (220) 616-5071 TTY (309)178-8473) to talk to a trained counselor call 1-800-273-TALK (toll free, 24 hour hotline) if you are experiencing a Mental Health or Behavioral Health Crisis or need someone to talk to.  Patient verbalizes understanding of instructions and care plan provided today and agrees to view in MyChart. Active MyChart status and patient understanding of how to access instructions and care plan via MyChart confirmed with patient.      Clarnce Crow BSN RN CCM [place]  [facility], [facility] Health RN Care Manager Direct Dial: [phone] Fax: [phone]

## 2023-10-14 NOTE — Patient Outreach (Signed)
 Complex Care Management   Visit Note  10/14/2023  Name:  Theresa Marquez MRN: [id number] DOB: 02-28-1966  Situation: Referral received for Complex Care Management related to Diabetes with Complications and HTN CAD Chronic Pain.  I obtained verbal consent from Parent Patient.  Visit completed with patient  on the phone  Background:   Past Medical History:  Diagnosis Date   Allergy    Endometrial polyp 08/09/2017   Formatting of this note might be different from the original. S/p hysteroscopy with [facility] 10/2017 with Dr Bernardino Bridge, was benign    Assessment: Patient Reported Symptoms:  Cognitive Cognitive Status: Alert and oriented to person, place, and time      Neurological Neurological Review of Symptoms: Dizziness, Headaches, Hearing changes, Numbness, Vision changes, Weakness (patient reports Neuro visit found no condition to explain condition) Neurological Self-Management Outcome: 3 (uncertain)  HEENT HEENT Symptoms Reported: Change or loss of hearing, Dryness, Ear pain, Eye discharge, Ear discharge, Nasal discharge, Frequent sneezing, Runny nose, Sore throat, Sudden change or loss of vision, Tearing (patient seeing speciality providers, ENT and GI scheduled ENT found no conditions) HEENT Management Strategies: Medication therapy, Routine screening, Weight management HEENT Self-Management Outcome: 3 (uncertain)    Cardiovascular Cardiovascular Symptoms Reported: Fatigue, Swelling in legs or feet Cardiovascular Conditions: High blood cholesterol, Hypertension Cardiovascular Management Strategies: Medication therapy, Routine screening, Weight management Do You Have a Working Readable Scale?: Yes Cardiovascular Self-Management Outcome: 3 (uncertain)  Respiratory Respiratory Symptoms Reported: Dry cough Respiratory Conditions: Seasonal allergies, Sleep disordered breathing (patient to call and schedule with Pulmonolgy)  Endocrine Patient reports the following symptoms related to  hypoglycemia or hyperglycemia : No symptoms reported Is patient diabetic?: Yes Is patient checking blood sugars at home?: No    Gastrointestinal Gastrointestinal Symptoms Reported: Obesity   Nutrition Risk Screen (CP): No indicators present  Genitourinary Genitourinary Symptoms Reported: No symptoms reported    Integumentary Integumentary Symptoms Reported: No symptoms reported    Musculoskeletal Musculoskelatal Symptoms Reviewed: Difficulty walking, Muscle pain, Weakness Musculoskeletal Management Strategies: Routine screening, Weight management Falls in the past year?: No Number of falls in past year: 2 or more Was there an injury with Fall?: Yes (concussion) Fall Risk Category Calculator: 2 Patient Fall Risk Level: Moderate Fall Risk Patient at Risk for Falls Due to: History of fall(s)  Psychosocial   Behavioral Health Comment: patient declined counseling referral  expressed frustration over preceived lack of answers for her symptoms   Quality of Family Relationships: unable to assess Do you feel physically threatened by others?: No      07/14/2023    1:43 PM  Depression screen PHQ 2/9  Decreased Interest 1  Down, Depressed, Hopeless 1  PHQ - 2 Score 2  Altered sleeping 3  Tired, decreased energy 3  Change in appetite 3  Feeling bad or failure about yourself  1  Trouble concentrating 0  Moving slowly or fidgety/restless 0  Suicidal thoughts 0  PHQ-9 Score 12  Difficult doing work/chores Extremely dIfficult    There were no vitals filed for this visit.  Medications Reviewed Today     Reviewed by Clarnce Crow, RN (Registered Nurse) on 10/14/23 at 1338  Med List Status: <None>   Medication Order Taking? Sig Documenting Provider Last Dose Status Informant  amoxicillin-clavulanate (AUGMENTIN) 875-125 MG tablet [id number] No SMARTSIG:1 Tablet(s) By Mouth Every 12 Hours  Patient not taking: Reported on 10/14/2023   [provider] Not Taking Active  Med Note Burley Carpenter, Kaulana Brindle   Fri Oct 14, 2023  1:35 PM) Patient completed course still having symptoms though somewhat improved  azelastine  (ASTELIN ) 0.1 % nasal spray [id number] Yes Place 2 sprays into both nostrils 2 (two) times daily. Use in each nostril as directed Artice Last, MD Taking Active   bacitracin  500 UNIT/GM ointment [id number] No Apply 1 Application topically 2 (two) times daily. Apply to affected area BID x 10 days then stop  Patient not taking: Reported on 10/14/2023   Soldatova, Liuba, MD Not Taking Active   esomeprazole  (NEXIUM ) 40 MG capsule [id number] Yes Take 1 capsule (40 mg total) by mouth daily. Catheryn Cluck, MD Taking Active   famotidine  (PEPCID ) 20 MG tablet [id number] Yes Take 1 tablet (20 mg total) by mouth at bedtime. Soldatova, Liuba, MD Taking Active   furosemide  (LASIX ) 40 MG tablet [id number] Yes Take 1 tablet (40 mg total) by mouth daily. Hassan Links, MD Taking Active   ibuprofen  (ADVIL ) 800 MG tablet [id number] Yes Take 1 tablet (800 mg total) by mouth every 8 (eight) hours as needed (pain). Catheryn Cluck, MD Taking Active   ipratropium (ATROVENT ) 0.06 % nasal spray [id number]  Place 2 sprays into both nostrils 2 (two) times daily. Catheryn Cluck, MD  Expired 07/14/23 2359   levalbuterol  (XOPENEX  HFA) 45 MCG/ACT inhaler [id number] Yes Inhale 1-2 puffs into the lungs every 6 (six) hours as needed for wheezing. Catheryn Cluck, MD Taking Active   levocetirizine (XYZAL  ALLERGY 24HR) 5 MG tablet [id number] Yes Take 1 tablet (5 mg total) by mouth every evening. Soldatova, Liuba, MD Taking Active   mometasone  (NASONEX ) 50 MCG/ACT nasal spray [id number] No Place 2 sprays into the nose daily.  Patient not taking: Reported on 10/14/2023   Soldatova, Liuba, MD Not Taking Active            Med Note Burley Carpenter, Israella Hubert   Fri Oct 14, 2023  1:37 PM) Patient was not aware of rx, will call pharmacy and get it picked up  pravastatin  (PRAVACHOL ) 20 MG tablet [id number] Yes  Take 1 tablet (20 mg total) by mouth daily. Catheryn Cluck, MD Taking Active   Semaglutide ,0.25 or 0.5MG /DOS, 2 MG/1.5ML Charisse Conception [id number] Yes Inject 0.5 mg into the skin once a week. Catheryn Cluck, MD Taking Active   spironolactone  (ALDACTONE ) 25 MG tablet [id number] Yes Take 1 tablet (25 mg total) by mouth daily. Catheryn Cluck, MD Taking Active             Recommendation:   Patient provided with contact information and encouraged to call Pulmonology to schedule appointment.   Follow Up Plan:   Telephone follow up appointment date/time:  10/31/23 at 3:00   Clarnce Crow BSN RN CCM [place]  [facility], [facility] Health RN Care Manager Direct Dial: [phone] Fax: [phone]

## 2023-10-15 NOTE — Progress Notes (Signed)
 Attending Physician's Attestation   I have reviewed the chart.   I agree with the Advanced Practitioner's note, impression, and recommendations with any updates as below.    Corliss Parish, MD [facility] Advanced Endoscopy Office # [phone]

## 2023-10-27 ENCOUNTER — Other Ambulatory Visit: Payer: Self-pay | Admitting: Family Medicine

## 2023-10-27 DIAGNOSIS — G8929 Other chronic pain: Secondary | ICD-10-CM

## 2023-10-31 ENCOUNTER — Other Ambulatory Visit: Payer: Self-pay

## 2023-10-31 NOTE — Patient Outreach (Signed)
 Complex Care Management   Visit Note  10/31/2023  Name:  Theresa Marquez MRN: [id number] DOB: [birth year]  Situation: Referral received for Complex Care Management related to  CAD HLD DM  I obtained verbal consent from Patient.  Visit completed with patient  on the phone  Background:   Past Medical History:  Diagnosis Date   Allergy    Endometrial polyp 08/09/2017   Formatting of this note might be different from the original. S/p hysteroscopy with [facility] 10/2017 with Dr Bernardino Bridge, was benign    Assessment: Patient Reported Symptoms:  Cognitive Cognitive Status: Alert and oriented to person, place, and time      Neurological Neurological Review of Symptoms: Dizziness, Headaches, Hearing changes, Numbness, Vision changes, Weakness (patient reports symptoms unchanged  seeing ENT) Neurological Self-Management Outcome: 3 (uncertain)  HEENT HEENT Symptoms Reported: Change or loss of hearing, Ear pain, Eye discharge, Eye pain, Nasal discharge, Runny nose, Sore throat (symptoms unchanged except for swelling of left eye and face, reports was worse over the weekend, now "stye" is draining.  RNCM recommended patient call to ENT asap to address) HEENT Management Strategies: Medication therapy, Routine screening    Cardiovascular      Respiratory      Endocrine Is patient diabetic?: Yes (07/14/23 A1C 6.9) Is patient checking blood sugars at home?: No Endocrine Conditions: Diabetes Endocrine Management Strategies: Weight management, Medication therapy, Diet modification, Routine screening  Gastrointestinal Gastrointestinal Symptoms Reported: Obesity      Genitourinary Genitourinary Symptoms Reported: No symptoms reported    Integumentary Integumentary Symptoms Reported: No symptoms reported    Musculoskeletal Musculoskelatal Symptoms Reviewed: Difficulty walking, Muscle pain, Unsteady gait (patient reports symptoms unchanged from last assessment) Musculoskeletal Conditions: Fibromyalgia (patient  reports symptoms "like" fibromyalgia) Musculoskeletal Management Strategies: Weight management, Coping strategies, Medication therapy Falls in the past year?: No Number of falls in past year: 1 or less Fall risk Follow up: Falls prevention discussed  Psychosocial Psychosocial Symptoms Reported: No symptoms reported            07/14/2023    1:43 PM  Depression screen PHQ 2/9  Decreased Interest 1  Down, Depressed, Hopeless 1  PHQ - 2 Score 2  Altered sleeping 3  Tired, decreased energy 3  Change in appetite 3  Feeling bad or failure about yourself  1  Trouble concentrating 0  Moving slowly or fidgety/restless 0  Suicidal thoughts 0  PHQ-9 Score 12  Difficult doing work/chores Extremely dIfficult    There were no vitals filed for this visit.  Medications Reviewed Today     Reviewed by Clarnce Crow, RN (Registered Nurse) on 10/31/23 at 1516  Med List Status: <None>   Medication Order Taking? Sig Documenting Provider Last Dose Status Informant  amoxicillin-clavulanate (AUGMENTIN) 875-125 MG tablet [id number]  SMARTSIG:1 Tablet(s) By Mouth Every 12 Hours  Patient not taking: Reported on 10/14/2023   [provider]  Active            Med Note Burley Carpenter, Deyjah Kindel   Fri Oct 14, 2023  1:35 PM) Patient completed course still having symptoms though somewhat improved  azelastine  (ASTELIN ) 0.1 % nasal spray [id number] Yes Place 2 sprays into both nostrils 2 (two) times daily. Use in each nostril as directed Artice Last, MD Taking Active   bacitracin  500 UNIT/GM ointment [id number] No Apply 1 Application topically 2 (two) times daily. Apply to affected area BID x 10 days then stop  Patient not taking: Reported on 10/31/2023   Artice Last, MD  Not Taking Active   esomeprazole  (NEXIUM ) 40 MG capsule [id number] Yes Take 1 capsule (40 mg total) by mouth daily. Catheryn Cluck, MD Taking Active   famotidine  (PEPCID ) 20 MG tablet [id number] Yes Take 1 tablet (20 mg total) by mouth at  bedtime. Soldatova, Liuba, MD Taking Active   furosemide  (LASIX ) 40 MG tablet [id number] Yes Take 1 tablet (40 mg total) by mouth daily. Hassan Links, MD Taking Active   ibuprofen  (ADVIL ) 800 MG tablet [id number] Yes TAKE 1 TABLET BY MOUTH EVERY 8 HOURS AS NEEDED FOR PAIN Catheryn Cluck, MD Taking Active   ipratropium (ATROVENT ) 0.06 % nasal spray [id number]  Place 2 sprays into both nostrils 2 (two) times daily. Catheryn Cluck, MD  Expired 07/14/23 2359   levalbuterol  (XOPENEX  HFA) 45 MCG/ACT inhaler [id number] Yes Inhale 1-2 puffs into the lungs every 6 (six) hours as needed for wheezing. Catheryn Cluck, MD Taking Active   levocetirizine (XYZAL  ALLERGY 24HR) 5 MG tablet [id number] Yes Take 1 tablet (5 mg total) by mouth every evening. Soldatova, Liuba, MD Taking Active   mometasone  (NASONEX ) 50 MCG/ACT nasal spray [id number] No Place 2 sprays into the nose daily.  Patient not taking: Reported on 10/31/2023   Soldatova, Liuba, MD Not Taking Active            Med Note Burley Carpenter, Suzann Lazaro   Fri Oct 14, 2023  1:37 PM) Patient was not aware of rx, will call pharmacy and get it picked up  pravastatin  (PRAVACHOL ) 20 MG tablet [id number] Yes Take 1 tablet (20 mg total) by mouth daily. Catheryn Cluck, MD Taking Active   Semaglutide ,0.25 or 0.5MG /DOS, 2 MG/1.5ML Charisse Conception [id number] Yes Inject 0.5 mg into the skin once a week. Catheryn Cluck, MD Taking Active   spironolactone  (ALDACTONE ) 25 MG tablet [id number] Yes Take 1 tablet (25 mg total) by mouth daily. Catheryn Cluck, MD Taking Active             Recommendation:   PCP Follow-up Specialty provider follow-up instructed patient to reach out to ENT provider to discuss symptoms of swelling with possible infection of eye  Follow Up Plan:   Telephone follow up appointment date/time:  12/01/23 at 3:00   Clarnce Crow BSN RN CCM [place]  [facility], [facility] Health RN Care Manager Direct Dial: [phone] Fax:  [phone]

## 2023-10-31 NOTE — Patient Instructions (Signed)
 Visit Information  Thank you for taking time to visit with me today. Please don't hesitate to contact me if I can be of assistance to you before our next scheduled appointment.  Our next appointment is by telephone on 12/01/23 at 3:00 Please call the care guide team at [phone] if you need to cancel or reschedule your appointment.   Following is a copy of your care plan:   Goals Addressed             This Visit's Progress    VBCI RN Care Plan   No change    Problems:  Chronic Disease Management support and education needs related to CAD, DMII, and HTN  Goal: Over the next 3 months the Patient will attend all scheduled medical appointments: 11/08/23 EGD/Colonoscopy, 11/23/23 PULM 11/14/23 PCP  as evidenced by patient report and chart review        continue to work with RN Care Manager and/or Social Worker to address care management and care coordination needs related to CAD, DMII, and HTN as evidenced by adherence to care management team scheduled appointments     demonstrate a decrease CAD, DMII, and HTN in exacerbations as evidenced by patient report and chart review  Interventions:   Evaluation of current treatment plan related to CAD, DMII, and HTN,  medical treatment  self-management and patient's adherence to plan as established by provider. Discussed plans with patient for ongoing care management follow up and provided patient with direct contact information for care management team Evaluation of current treatment plan related to current symptoms of self reported chronic pain, "all over body swelling" and patient's adherence to plan as established by provider Reviewed medications with patient and discussed rationale for use Reviewed scheduled/upcoming provider appointments including PCP and speciality providers as well as upcoming scheduled procedures. Discussed plans with patient for ongoing care management follow up and provided patient with direct contact information for care  management team Advised patient to discuss unresolved symptoms with provider.  Patient reports facial swelling due to sty at left eye, better now than over the weekend, is draining and swelling is less, RNCM instructed patient to contact ENT provider for further instructions or appointment.  Patient Self-Care Activities:  Attend all scheduled provider appointments Call pharmacy for medication refills 3-7 days in advance of running out of medications Call provider office for new concerns or questions  Perform all self care activities independently  Take medications as prescribed    Plan:  Telephone follow up appointment with care management team member scheduled for:  12/01/23 at 3:00   Clarnce Crow BSN RN CCM [place]  [facility], [facility] Health RN Care Manager Direct Dial: [phone] Fax: [phone]             Please call the Suicide and Crisis Lifeline: 988 call the [place]  National Suicide Prevention Lifeline: 510-225-5196 or TTY: 321-458-8945 TTY 343-448-3063) to talk to a trained counselor call 1-800-273-TALK (toll free, 24 hour hotline) if you are experiencing a Mental Health or Behavioral Health Crisis or need someone to talk to.  Patient verbalizes understanding of instructions and care plan provided today and agrees to view in MyChart. Active MyChart status and patient understanding of how to access instructions and care plan via MyChart confirmed with patient.      Clarnce Crow BSN RN CCM [place]  [facility], [facility] Health RN Care Manager Direct Dial: [phone] Fax: [phone]

## 2023-11-07 ENCOUNTER — Encounter (HOSPITAL_COMMUNITY): Payer: Self-pay

## 2023-11-08 ENCOUNTER — Encounter: Admitting: Gastroenterology

## 2023-11-08 ENCOUNTER — Telehealth: Payer: Self-pay | Admitting: Gastroenterology

## 2023-11-08 NOTE — Telephone Encounter (Signed)
 Patient called and stated that she has taken her prep medication as instructed but has had no lucky cleaning herself out. Patient is requesting a call back today. Patient is scheduled for today at 1:00 PM for a Endo colon. Please advise.

## 2023-11-08 NOTE — Telephone Encounter (Signed)
 Pt called and stated that she took both doses of miralax  prep and has not had a bowel movement since last night. BM was loose stool but not liquid and clear. Has not had a bowel movement this morning since taking the second dose of the prep. Spoke with MD and was advised that she could come in today and do enema or reschedule. Pt wanted to reschedule for a later date to do both procedures on the same day. Will schedule pt for a pre-visit appt, and procedure.

## 2023-11-08 NOTE — Telephone Encounter (Signed)
 Thank you for update. No late cancellation fee at this time. Will see her for rescheduled procedure. GM

## 2023-11-11 ENCOUNTER — Ambulatory Visit (AMBULATORY_SURGERY_CENTER): Payer: Self-pay | Admitting: *Deleted

## 2023-11-11 VITALS — Ht 64.0 in | Wt 250.0 lb

## 2023-11-11 DIAGNOSIS — Z800 Family history of malignant neoplasm of digestive organs: Secondary | ICD-10-CM

## 2023-11-11 DIAGNOSIS — R195 Other fecal abnormalities: Secondary | ICD-10-CM

## 2023-11-11 MED ORDER — PEG 3350-KCL-NA BICARB-NACL 420 G PO SOLR: 4000.0000 mL | Freq: Once | ORAL | 0 refills | Status: AC

## 2023-11-11 NOTE — Progress Notes (Addendum)
 Pt's name and DOB verified at the beginning of the pre-visit wit 2 identifiers   Pt denies any difficulty with ambulating,sitting, laying down or rolling side to side  Pt has no issues moving head neck or swallowing  No egg or soy allergy known to patient   No issues known to pt with past sedation with any surgeries or procedures  Patient denies ever being intubated  No FH of Malignant Hyperthermia  Pt is not on home 02   Pt is not on blood thinners   Pt denies issues with constipation   Pt is not on dialysis  Pt denise any abnormal heart rhythms   Pt denies any upcoming cardiac testing  Chart not reviewed by CRNA prior to PV  Visit by phone  Pt states weight is 250 lb  IInstructions reviewed. Pt given  both [facility] main # and MD on call # prior to instructions.  Pt states understanding of instructions. Instructed pt to review instructions again prior to procedure and call main # given if has questions.. Pt states they will.   Instructed pt on where to find instructions on My Chart.

## 2023-11-14 ENCOUNTER — Encounter: Payer: Self-pay | Admitting: Family Medicine

## 2023-11-14 ENCOUNTER — Ambulatory Visit: Payer: Medicaid Other | Admitting: Family Medicine

## 2023-11-14 VITALS — BP 130/74 | HR 94 | Temp 97.6°F | Resp 18 | Wt 253.0 lb

## 2023-11-14 DIAGNOSIS — Z124 Encounter for screening for malignant neoplasm of cervix: Secondary | ICD-10-CM

## 2023-11-14 DIAGNOSIS — G8929 Other chronic pain: Secondary | ICD-10-CM

## 2023-11-14 DIAGNOSIS — K219 Gastro-esophageal reflux disease without esophagitis: Secondary | ICD-10-CM | POA: Insufficient documentation

## 2023-11-14 DIAGNOSIS — T887XXA Unspecified adverse effect of drug or medicament, initial encounter: Secondary | ICD-10-CM | POA: Insufficient documentation

## 2023-11-14 DIAGNOSIS — J410 Simple chronic bronchitis: Secondary | ICD-10-CM

## 2023-11-14 DIAGNOSIS — Z794 Long term (current) use of insulin: Secondary | ICD-10-CM

## 2023-11-14 DIAGNOSIS — H539 Unspecified visual disturbance: Secondary | ICD-10-CM | POA: Insufficient documentation

## 2023-11-14 DIAGNOSIS — M5441 Lumbago with sciatica, right side: Secondary | ICD-10-CM | POA: Diagnosis not present

## 2023-11-14 DIAGNOSIS — M5442 Lumbago with sciatica, left side: Secondary | ICD-10-CM

## 2023-11-14 DIAGNOSIS — E119 Type 2 diabetes mellitus without complications: Secondary | ICD-10-CM | POA: Diagnosis not present

## 2023-11-14 DIAGNOSIS — J329 Chronic sinusitis, unspecified: Secondary | ICD-10-CM | POA: Diagnosis not present

## 2023-11-14 DIAGNOSIS — N951 Menopausal and female climacteric states: Secondary | ICD-10-CM | POA: Insufficient documentation

## 2023-11-14 HISTORY — DX: Gastro-esophageal reflux disease without esophagitis: K21.9

## 2023-11-14 HISTORY — DX: Menopausal and female climacteric states: N95.1

## 2023-11-14 HISTORY — DX: Unspecified visual disturbance: H53.9

## 2023-11-14 HISTORY — DX: Unspecified adverse effect of drug or medicament, initial encounter: T88.7XXA

## 2023-11-14 LAB — POCT GLYCOSYLATED HEMOGLOBIN (HGB A1C): Hemoglobin A1C: 5.7 % — AB (ref 4.0–5.6)

## 2023-11-14 MED ORDER — PROMETHAZINE-DM 6.25-15 MG/5ML PO SYRP: 5.0000 mL | ORAL_SOLUTION | Freq: Four times a day (QID) | ORAL | 0 refills | Status: AC | PRN

## 2023-11-14 MED ORDER — FAMOTIDINE 40 MG PO TABS
40.0000 mg | ORAL_TABLET | Freq: Every day | ORAL | 3 refills | Status: AC
Start: 2023-11-14 — End: 2024-11-08

## 2023-11-14 MED ORDER — CELECOXIB 200 MG PO CAPS: 200.0000 mg | ORAL_CAPSULE | Freq: Two times a day (BID) | ORAL | 0 refills | Status: DC | PRN

## 2023-11-14 MED ORDER — SEMAGLUTIDE (1 MG/DOSE) 4 MG/3ML ~~LOC~~ SOPN: 1.0000 mg | PEN_INJECTOR | SUBCUTANEOUS | 0 refills | Status: AC

## 2023-11-14 NOTE — Progress Notes (Signed)
 Assessment & Plan   Assessment/Plan:     Assessment & Plan Chronic cough Chronic cough likely multifactorial, possibly due to sinus drainage, smoking, and potential silent GERD. History of severe cough leading to blackouts. ENT suggested sinus drainage and severe acid reflux as contributing factors. Hesitant to use CPAP due to concerns about worsening symptoms. - Prescribe promethazine  DM syrup for cough, 5 ml up to four times a day as needed. - Encourage reduction in smoking to help with cough and breathing issues. - Follow up with pulmonologist for further evaluation of chronic cough and breathing issues.  Sinusitis Recurrent sinusitis with persistent sores around the nose and eyes. ENT prescribed erythromycin ointment, azelastine , and mometasone  for management. Sores are improving but not fully resolved. - Continue azelastine  nasal spray. - Continue mometasone  nasal spray.  Gastroesophageal reflux disease (GERD) GERD suspected to contribute to chronic cough despite absence of typical symptoms. ENT recommended treatment for silent reflux. On esomeprazole  and famotidine . - Increase famotidine  to 40 mg at night to address potential silent reflux contributing to cough.  Type 2 diabetes mellitus Type 2 diabetes mellitus is well-controlled with an A1c of 5.7. Tolerating semaglutide  (Ozempic ) well without significant side effects. No nausea reported, but experienced transient back pain, possibly unrelated. - Increase semaglutide  (Ozempic ) to 1 mg weekly to maintain blood sugar control.  Visual disturbances Reports visual disturbances including floaters, flashing lights, and blurred vision. No recent diabetic eye exam. Symptoms suggest possible diabetic complications. - Refer to ophthalmology for diabetic eye exam and evaluation of visual disturbances.  Allergic rhinitis Allergic rhinitis managed with azelastine  nasal spray. Experiences significant allergy symptoms when outdoors. -  Continue azelastine  nasal spray. - Continue levocetirizine 5 mg QHS for allergy management.  Menopausal symptoms Reports severe temperature regulation issues, possibly related to menopausal symptoms. Referral to OBGYN for further evaluation and management. - Follow up with OBGYN for evaluation and management of menopausal symptoms.  Pain management Experiences generalized pain and sensitivity to touch. Previous trials of duloxetine  and gabapentin  were not effective. Advised against NSAIDs due to GI concerns. - Prescribe Celebrex  200 mg twice a day as needed for pain management.  Colonoscopy preparation issues Experienced severe constipation and nausea following colonoscopy prep. Gastroenterology advised to repeat the same prep, but she is hesitant due to previous adverse effects. - Discuss alternative colonoscopy preps with gastroenterology to avoid recurrence of adverse effects.      Medications Discontinued During This Encounter  Medication Reason   amoxicillin-clavulanate (AUGMENTIN) 875-125 MG tablet    famotidine  (PEPCID ) 20 MG tablet    ibuprofen  (ADVIL ) 800 MG tablet     No follow-ups on file.        Subjective:   Encounter date: 11/14/2023  Theresa Marquez is a 58 y.o. female who has Family history of thyroid  disease; Fatigue; Neck swelling; Obesity, morbid, BMI 40.0-49.9 (HCC); Tobacco abuse; Bilateral lower extremity edema; Recurrent sinus infections; Rhinitis; OSA (obstructive sleep apnea); AKI (acute kidney injury) (HCC); Wheezing; Chronic bilateral low back pain with bilateral sciatica; Allergy; Cervicalgia; Sleep disturbance; Pruritic dermatitis; Weakness of extremity; Seizure-like activity (HCC); Neuralgia; Rash of both hands; Syncope; Frequent falls; Lumbar foraminal stenosis; Small vessel disease (HCC); Closed fracture of tooth; Sedimentation rate elevation; Pain in thoracic spine; Paresthesia; Myalgia due to statin; Lymphadenopathy; Positive colorectal cancer  screening using Cologuard test; Class 3 severe obesity due to excess calories with serious comorbidity and body mass index (BMI) of 45.0 to 49.9 in adult; Abdominal bloating; Depression, major, single episode, moderate (HCC); Small  airways disease; Colon, diverticulosis; Pulmonary artery hypertension (HCC); Coronary artery disease involving native coronary artery of native heart without angina pectoris; Aortic atherosclerosis (HCC); Umbilical hernia without obstruction and without gangrene; Tonsillitis and adenoiditis, chronic; Hepatomegaly; Metabolic dysfunction-associated steatotic liver disease (MASLD); Type 2 diabetes mellitus without complication, with long-term current use of insulin (HCC); Simple chronic bronchitis (HCC); Breast asymmetry in left breast on screeing mammogram; Mass of upper outer quadrant of left breast; Chronic cough; Nausea; Hoarseness; Vasomotor symptoms due to menopause; Gastroesophageal reflux disease; Visual disturbances; and Drug side effects on their problem list..   She  has a past medical history of Allergy, Chronic pain, COPD (chronic obstructive pulmonary disease) (HCC), Coronary artery disease, Diabetes mellitus without complication (HCC), Endometrial polyp (08/09/2017), and Sleep apnea..   She presents with chief complaint of weight managment  (3 month follow up. //HM due- diabetic eye exam, foot exam, vaccinations (colonoscopy scheduled for June) Pt need OBGYN referral (last placed in January) ) and Pain Management (Pt c/o of muscle and joint pain with fatigue and weakness symptoms ) .   Discussed the use of AI scribe software for clinical note transcription with the patient, who gave verbal consent to proceed.  History of Present Illness Theresa Marquez "Theresa Marquez" is a 58 year old female who presents with follow-up on diabetes management and evaluation of persistent cough and sinus issues.  She is following up on her diabetes management, specifically regarding her use  of Wegovy  and Ozempic  (semaglutide  0.5 mg). She has not experienced significant gastrointestinal side effects from Ozempic , although she had unusual back pain for a day, which resolved spontaneously.  She has persistent sinus issues, including a sinus infection at the end of March, which led to an emergency room visit. She was prescribed erythromycin ointment for recurring and painful sores at the corners of her mouth and eyes. She was also prescribed azelastine  nasal spray, but only one of the two nasal sprays was filled. Her concerns were not fully addressed by the ENT, and she continues to experience sores and sinus congestion.  She has a history of a severe cough that was intense enough to cause syncope. She has not been prescribed anything for the cough recently, although she previously used codeine syrup effectively. She is allergic to codeine after experiencing hallucinations post-childbirth. She rarely uses her inhaler. The ENT suggested her cough might be due to sinus drainage and possible silent reflux, although she does not have typical reflux symptoms. She is currently taking esomeprazole  and famotidine  for reflux.  She experiences visual disturbances, including blurred vision, floaters, and 'shadow people' in her peripheral vision. She has not had a recent diabetic eye exam.  She has concerns about a recent colonoscopy prep that resulted in constipation for nine days, causing nausea and a lack of appetite. She is hesitant to repeat the prep.  She experiences temperature regulation issues, feeling extremely hot or cold, which she attributes to hormonal changes. She has an upcoming appointment with a pulmonologist for her chronic cough and breathing issues.  She takes levocetirizine 5 mg at bedtime for allergies, which cause her to develop a rash and breathing difficulties when exposed to warm weather.    Past Surgical History:  Procedure Laterality Date   CHOLECYSTECTOMY     DG GALL  BLADDER Right     Outpatient Medications Prior to Visit  Medication Sig Dispense Refill   azelastine  (ASTELIN ) 0.1 % nasal spray Place 2 sprays into both nostrils 2 (two) times daily. Use in  each nostril as directed 30 mL 12   bacitracin  500 UNIT/GM ointment Apply 1 Application topically 2 (two) times daily. Apply to affected area BID x 10 days then stop 15 g 0   esomeprazole  (NEXIUM ) 40 MG capsule Take 1 capsule (40 mg total) by mouth daily. 30 capsule 3   furosemide  (LASIX ) 40 MG tablet Take 1 tablet (40 mg total) by mouth daily. 90 tablet 3   levalbuterol  (XOPENEX  HFA) 45 MCG/ACT inhaler Inhale 1-2 puffs into the lungs every 6 (six) hours as needed for wheezing. 15 g 0   levocetirizine (XYZAL  ALLERGY 24HR) 5 MG tablet Take 1 tablet (5 mg total) by mouth every evening. 30 tablet 3   mometasone  (NASONEX ) 50 MCG/ACT nasal spray Place 2 sprays into the nose daily. 1 each 12   polyethylene glycol-electrolytes (NULYTELY ) 420 g solution Take by mouth.     pravastatin  (PRAVACHOL ) 20 MG tablet Take 1 tablet (20 mg total) by mouth daily. 90 tablet 0   famotidine  (PEPCID ) 20 MG tablet Take 1 tablet (20 mg total) by mouth at bedtime. 30 tablet 3   ibuprofen  (ADVIL ) 800 MG tablet TAKE 1 TABLET BY MOUTH EVERY 8 HOURS AS NEEDED FOR PAIN 90 tablet 0   ipratropium (ATROVENT ) 0.06 % nasal spray Place 2 sprays into both nostrils 2 (two) times daily. 12 mL 2   spironolactone  (ALDACTONE ) 25 MG tablet Take 1 tablet (25 mg total) by mouth daily. 90 tablet 3   amoxicillin-clavulanate (AUGMENTIN) 875-125 MG tablet SMARTSIG:1 Tablet(s) By Mouth Every 12 Hours (Patient not taking: Reported on 11/14/2023)     No facility-administered medications prior to visit.    Family History  Problem Relation Age of Onset   Kidney disease Mother    Rectal cancer Father    Colon cancer Father    Cancer Father    Colon cancer Maternal Grandmother    Cerebral palsy Daughter    Breast cancer Neg Hx    Crohn's disease Neg Hx     Colon polyps Neg Hx    Esophageal cancer Neg Hx    Stomach cancer Neg Hx     Social History   Socioeconomic History   Marital status: Divorced    Spouse name: Not on file   Number of children: 3   Years of education: Not on file   Highest education level: Associate degree: academic program  Occupational History   Occupation: [employer]    Comment: Full-Time.   Occupation: unemployed  Tobacco Use   Smoking status: Every Day    Current packs/day: 0.25    Average packs/day: 0.3 packs/day for 54.0 years (13.5 ttl pk-yrs)    Types: Cigarettes    Start date: 10/26/1969   Smokeless tobacco: Never   Tobacco comments:    Smoking cessation  Vaping Use   Vaping status: Never Used  Substance and Sexual Activity   Alcohol use: Not Currently    Comment: socially   Drug use: Never   Sexual activity: Not Currently    Birth control/protection: Post-menopausal  Other Topics Concern   Not on file  Social History Narrative   Right Handed    Lives in a one story home with a basement   Social Drivers of Health   Financial Resource Strain: High Risk (06/13/2023)   Overall Financial Resource Strain (CARDIA)    Difficulty of Paying Living Expenses: Very hard  Food Insecurity: No Food Insecurity (10/14/2023)   Hunger Vital Sign    Worried About Running Out of  Food in the Last Year: Never true    Ran Out of Food in the Last Year: Never true  Transportation Needs: No Transportation Needs (10/14/2023)   PRAPARE - Administrator, Civil Service (Medical): No    Lack of Transportation (Non-Medical): No  Physical Activity: Unknown (06/13/2023)   Exercise Vital Sign    Days of Exercise per Week: 0 days    Minutes of Exercise per Session: Not on file  Stress: No Stress Concern Present (06/13/2023)   [employer] of Occupational Health - Occupational Stress Questionnaire    Feeling of Stress : Only a little  Social Connections: Moderately Isolated (06/13/2023)   Social Connection  and Isolation Panel [NHANES]    Frequency of Communication with Friends and Family: Three times a week    Frequency of Social Gatherings with Friends and Family: Never    Attends Religious Services: 1 to 4 times per year    Active Member of [employer] or Organizations: No    Attends Banker Meetings: Not on file    Marital Status: Divorced  Intimate Partner Violence: Not At Risk (10/14/2023)   Humiliation, Afraid, Rape, and Kick questionnaire    Fear of Current or Ex-Partner: No    Emotionally Abused: No    Physically Abused: No    Sexually Abused: No                                                                                                  Objective:  Physical Exam: BP 130/74 (BP Location: Right Arm, Patient Position: Sitting, Cuff Size: Large) Comment: recheck after resting  Pulse 94   Temp 97.6 F (36.4 C) (Temporal)   Resp 18   Wt 253 lb (114.8 kg)   LMP  (LMP Unknown)   SpO2 95%   BMI 43.43 kg/m   Wt Readings from Last 3 Encounters:  11/14/23 253 lb (114.8 kg)  11/11/23 250 lb (113.4 kg)  10/13/23 242 lb (109.8 kg)    Physical Exam GENERAL: Alert, cooperative, well developed, no acute distress. HEENT: Normocephalic, normal oropharynx, moist mucous membranes. CHEST: Clear to auscultation bilaterally, no wheezes, rhonchi, or crackles. CARDIOVASCULAR: Normal heart rate and rhythm, S1 and S2 normal without murmurs. ABDOMEN: Soft, non-tender, non-distended, without organomegaly, normal bowel sounds. EXTREMITIES: No cyanosis or edema. NEUROLOGICAL: Cranial nerves grossly intact, moves all extremities without gross motor or sensory deficit.   Physical Exam Constitutional:      General: She is not in acute distress.    Appearance: Normal appearance. She is not ill-appearing or toxic-appearing.     Comments: Ambulates with walker  HENT:     Head: Normocephalic and atraumatic.     Nose: Nose normal. No congestion.  Eyes:     General: No scleral  icterus.    Extraocular Movements: Extraocular movements intact.  Cardiovascular:     Rate and Rhythm: Normal rate and regular rhythm.     Pulses: Normal pulses.     Heart sounds: Normal heart sounds.  Pulmonary:     Effort: Pulmonary effort is normal. No respiratory distress.  Breath sounds: Normal breath sounds.  Abdominal:     General: Abdomen is flat. Bowel sounds are normal.     Palpations: Abdomen is soft.  Musculoskeletal:        General: Normal range of motion.     Right lower leg: Edema present.     Left lower leg: Edema present.  Lymphadenopathy:     Cervical: No cervical adenopathy.  Skin:    General: Skin is warm and dry.     Findings: No rash.  Neurological:     General: No focal deficit present.     Mental Status: She is alert and oriented to person, place, and time. Mental status is at baseline.  Psychiatric:        Mood and Affect: Mood normal.        Behavior: Behavior normal.        Thought Content: Thought content normal.        Judgment: Judgment normal.     MM 3D DIAGNOSTIC MAMMOGRAM UNILATERAL LEFT BREAST Result Date: 09/07/2023 CLINICAL DATA:  Screening recall from baseline screening mammogram for possible LEFT breast focal asymmetries. EXAM: DIGITAL DIAGNOSTIC UNILATERAL LEFT MAMMOGRAM WITH TOMOSYNTHESIS AND CAD; ULTRASOUND LEFT BREAST LIMITED TECHNIQUE: Left digital diagnostic mammography and breast tomosynthesis was performed. The images were evaluated with computer-aided detection. ; Targeted ultrasound examination of the left breast was performed. COMPARISON:  Previous exam(s). ACR Breast Density Category b: There are scattered areas of fibroglandular density. FINDINGS: Spot tomosynthesis views demonstrate multiple adjacent oval circumscribed masses the outer central left breast middle depth, measuring to 5 mm. These correspond with the screening mammogram findings. No new suspicious findings are identified in the left breast. Targeted left breast  ultrasound was performed at 2 o'clock 6-10 cm from the nipple. This demonstrates multiple oval circumscribed anechoic masses within bands of fibroglandular tissue, most consistent benign cysts/fibrocystic breast tissue. These correspond with the oval masses seen mammogram. No suspicious solid or cystic mass or area of shadowing is identified. Targeted left axillary ultrasound demonstrates morphologically benign lymph nodes. IMPRESSION: Probably benign oval circumscribed masses in the outer central LEFT breast, favored to represent benign cysts/fibrocystic breast tissue. RECOMMENDATION: LEFT diagnostic mammogram (and ultrasound if deemed necessary) in 6 months. I have discussed the findings and recommendations with the patient. If applicable, a reminder letter will be sent to the patient regarding the next appointment. BI-RADS CATEGORY  3: Probably benign. Electronically Signed   By: Sande Cromer M.D.   On: 09/07/2023 14:52   US  LIMITED ULTRASOUND INCLUDING AXILLA LEFT BREAST  Result Date: 09/07/2023 CLINICAL DATA:  Screening recall from baseline screening mammogram for possible LEFT breast focal asymmetries. EXAM: DIGITAL DIAGNOSTIC UNILATERAL LEFT MAMMOGRAM WITH TOMOSYNTHESIS AND CAD; ULTRASOUND LEFT BREAST LIMITED TECHNIQUE: Left digital diagnostic mammography and breast tomosynthesis was performed. The images were evaluated with computer-aided detection. ; Targeted ultrasound examination of the left breast was performed. COMPARISON:  Previous exam(s). ACR Breast Density Category b: There are scattered areas of fibroglandular density. FINDINGS: Spot tomosynthesis views demonstrate multiple adjacent oval circumscribed masses the outer central left breast middle depth, measuring to 5 mm. These correspond with the screening mammogram findings. No new suspicious findings are identified in the left breast. Targeted left breast ultrasound was performed at 2 o'clock 6-10 cm from the nipple. This demonstrates  multiple oval circumscribed anechoic masses within bands of fibroglandular tissue, most consistent benign cysts/fibrocystic breast tissue. These correspond with the oval masses seen mammogram. No suspicious solid or cystic mass or area of  shadowing is identified. Targeted left axillary ultrasound demonstrates morphologically benign lymph nodes. IMPRESSION: Probably benign oval circumscribed masses in the outer central LEFT breast, favored to represent benign cysts/fibrocystic breast tissue. RECOMMENDATION: LEFT diagnostic mammogram (and ultrasound if deemed necessary) in 6 months. I have discussed the findings and recommendations with the patient. If applicable, a reminder letter will be sent to the patient regarding the next appointment. BI-RADS CATEGORY  3: Probably benign. Electronically Signed   By: Sande Cromer M.D.   On: 09/07/2023 14:52    Recent Results (from the past 2160 hours)  POCT glycosylated hemoglobin (Hb A1C)     Status: Abnormal   Collection Time: 11/14/23  2:38 PM  Result Value Ref Range   Hemoglobin A1C 5.7 (A) 4.0 - 5.6 %   HbA1c POC (<> result, manual entry)     HbA1c, POC (prediabetic range)     HbA1c, POC (controlled diabetic range)          Carnell Christian, MD, MS

## 2023-11-18 ENCOUNTER — Telehealth: Payer: Self-pay

## 2023-11-18 NOTE — Progress Notes (Signed)
 Complex Care Management Note  Care Guide Note 11/18/2023 Name: ROZELL KETTLEWELL MRN: [id number] DOB: 09-Dec-1965  Ardella Beaver is a 58 y.o. year old female who sees Catheryn Cluck, MD for primary care. I reached out to Ardella Beaver by phone today to offer complex care management services.  Ms. Swayze was given information about Complex Care Management services today including:   The Complex Care Management services include support from the care team which includes your Nurse Care Manager, Clinical Social Worker, or Pharmacist.  The Complex Care Management team is here to help remove barriers to the health concerns and goals most important to you. Complex Care Management services are voluntary, and the patient may decline or stop services at any time by request to their care team member.   Complex Care Management Consent Status: Patient did not agree to participate in complex care management services at this time.  Follow up plan:  Patient will follow up with [facility] RNCM.  Encounter Outcome:  Patient Refused  Cayetano Coco [facility], [facility] Health Care Management Assistant Direct Dial: [phone]  Fax: [phone]

## 2023-11-23 ENCOUNTER — Encounter: Payer: Self-pay | Admitting: Pulmonary Disease

## 2023-11-23 ENCOUNTER — Ambulatory Visit: Admitting: Pulmonary Disease

## 2023-11-23 VITALS — BP 106/75 | HR 87 | Temp 98.0°F | Ht 64.0 in | Wt 252.4 lb

## 2023-11-23 DIAGNOSIS — R053 Chronic cough: Secondary | ICD-10-CM | POA: Diagnosis not present

## 2023-11-23 DIAGNOSIS — R0609 Other forms of dyspnea: Secondary | ICD-10-CM | POA: Diagnosis not present

## 2023-11-23 DIAGNOSIS — D751 Secondary polycythemia: Secondary | ICD-10-CM

## 2023-11-23 DIAGNOSIS — F17210 Nicotine dependence, cigarettes, uncomplicated: Secondary | ICD-10-CM

## 2023-11-23 MED ORDER — STIOLTO RESPIMAT 2.5-2.5 MCG/ACT IN AERS: 2.0000 | INHALATION_SPRAY | Freq: Every day | RESPIRATORY_TRACT | 11 refills | Status: DC

## 2023-11-23 NOTE — Patient Instructions (Signed)
 Nice to see you again  Lets try new inhaler to see if helps with the cough and shortness of breath  Use Stiolto 2 puffs once a day  We will get pulmonary function test to further evaluate the shortness of breath, we can get these done at your next visit and go over the results to save you a trip  Return to clinic in 2 months with pulmonary function test same day, follow-up with Dr. Marygrace Snellen afterwards

## 2023-11-23 NOTE — Progress Notes (Signed)
 @Patient  ID: Theresa Marquez, female    DOB: 11-May-1966, 58 y.o.   MRN: [id number]  Chief Complaint  Patient presents with   Consult    SOB, dry cough - Smokes 1/2 a pack a day    Referring provider: Artice Last, MD  HPI:   58 y.o. woman whom are seen for evaluation of chronic cough.  With recent ENT note reviewed.  Most recent PCP note reviewed.  Cough present for a long time.  Dry.  Feels like in her throat.  Like phlegm is stuck.  No phlegm ever comes.  Frequently clearing her throat E TC.  Worse at night when she lies down.  No seasonal or environmental factors she can identify it made things better or worse.  No alleviating  factors.  Sometimes cough is triggered by deep breaths or laughing.  Has tried intranasal steroids, antihistamine.  No improvement.  Saw ENT and sounds like on fiberoptic exam concern for reflux.  She denies any heartburn symptoms.  We did discuss silent reflux.  She smokes.  She has cut back.  40+ pack year.  Average a pack a day for 40 years.  She had a chest x-ray 08/2023 that revealed clear lungs per report.  This was done via [facility].  In the ED.  She reports dyspnea on exertion.  Reports of asthma.  Has albuterol  but does not use for cough diffuse for chest tightness and not really shortness of breath.  We discussed at length the etiology of cough in general and her in particular.  How we should treat this.  Further evaluation for this and her dyspnea on exertion.  We discussed smoking.  We discussed her occupational exposure to solvents etc. in the [employer] .  We discussed further workup with pulmonary function test.  Discussed using inhalers to help both with cough if related to bronchospasm as well as to help with shortness of breath.  Questionaires / Pulmonary Flowsheets:   ACT:      No data to display          MMRC:     No data to display          Epworth:      No data to display          Tests:   FENO:  No  results found for: "NITRICOXIDE"  PFT:     No data to display          WALK:      No data to display          Imaging: Personally reviewed and as per EMR and discussion in this note No results found.  Lab Results: Personally reviewed CBC    Component Value Date/Time   WBC 13.7 (H) 07/26/2023 1343   RBC 5.05 07/26/2023 1343   HGB 15.1 (H) 07/26/2023 1343   HGB 14.8 08/31/2022 1446   HCT 46.3 (H) 07/26/2023 1343   PLT 373.0 07/26/2023 1343   PLT 350 08/31/2022 1446   MCV 91.6 07/26/2023 1343   MCH 29.5 08/31/2022 1446   MCHC 32.6 07/26/2023 1343   RDW 16.6 (H) 07/26/2023 1343   LYMPHSABS 3.2 07/26/2023 1343   MONOABS 0.7 07/26/2023 1343   EOSABS 0.2 07/26/2023 1343   BASOSABS 0.1 07/26/2023 1343    BMET    Component Value Date/Time   NA 139 07/14/2023 1436   NA 143 05/10/2022 1506   K 3.6 07/14/2023 1436   CL 100 07/14/2023 1436  CO2 29 07/14/2023 1436   GLUCOSE 89 07/14/2023 1436   BUN 7 07/14/2023 1436   BUN 6 05/10/2022 1506   CREATININE 0.70 07/14/2023 1436   CREATININE 0.87 08/31/2022 1446   CALCIUM  9.1 07/14/2023 1436   GFRNONAA >60 08/31/2022 1446    BNP    Component Value Date/Time   BNP 33.3 12/01/2021 2106    ProBNP    Component Value Date/Time   PROBNP 145 05/10/2022 1506    Specialty Problems       Pulmonary Problems   Recurrent sinus infections   Rhinitis   OSA (obstructive sleep apnea)   Wheezing   Small airways disease   Tonsillitis and adenoiditis, chronic   Simple chronic bronchitis (HCC)   Chronic cough    Allergies  Allergen Reactions   Codeine Other (See Comments)    Hallucinations  Hallucinates    Dietary Management Product Other (See Comments)    Breaks out in hives when drinking diet soda   Prednisone Swelling   Statins Other (See Comments)    Myalgias    Immunization History  Administered Date(s) Administered   Influenza,inj,Quad PF,6+ Mos 06/19/2018   Tdap 09/23/2019   Zoster  Recombinant(Shingrix ) 11/30/2021    Past Medical History:  Diagnosis Date   Allergy    Chronic pain    COPD (chronic obstructive pulmonary disease) (HCC)    Coronary artery disease    Diabetes mellitus without complication (HCC)    Endometrial polyp 08/09/2017   Formatting of this note might be different from the original. S/p hysteroscopy with D&C 10/2017 with Dr Bernardino Bridge, was benign   Sleep apnea     Tobacco History: Social History   Tobacco Use  Smoking Status Every Day   Current packs/day: 0.25   Average packs/day: 0.3 packs/day for 54.1 years (13.5 ttl pk-yrs)   Types: Cigarettes   Start date: 10/26/1969  Smokeless Tobacco Never  Tobacco Comments   Smoking cessation   Ready to quit: No Counseling given: No Tobacco comments: Smoking cessation   Continue to not smoke  Outpatient Encounter Medications as of 11/23/2023  Medication Sig   azelastine  (ASTELIN ) 0.1 % nasal spray Place 2 sprays into both nostrils 2 (two) times daily. Use in each nostril as directed   celecoxib  (CELEBREX ) 200 MG capsule Take 1 capsule (200 mg total) by mouth 2 (two) times daily as needed (pain).   esomeprazole  (NEXIUM ) 40 MG capsule Take 1 capsule (40 mg total) by mouth daily.   famotidine  (PEPCID ) 40 MG tablet Take 1 tablet (40 mg total) by mouth at bedtime.   furosemide  (LASIX ) 40 MG tablet Take 1 tablet (40 mg total) by mouth daily.   levalbuterol  (XOPENEX  HFA) 45 MCG/ACT inhaler Inhale 1-2 puffs into the lungs every 6 (six) hours as needed for wheezing.   levocetirizine (XYZAL  ALLERGY 24HR) 5 MG tablet Take 1 tablet (5 mg total) by mouth every evening.   mometasone  (NASONEX ) 50 MCG/ACT nasal spray Place 2 sprays into the nose daily.   polyethylene glycol-electrolytes (NULYTELY ) 420 g solution Take by mouth.   pravastatin  (PRAVACHOL ) 20 MG tablet Take 1 tablet (20 mg total) by mouth daily.   promethazine -dextromethorphan (PROMETHAZINE -DM) 6.25-15 MG/5ML syrup Take 5 mLs by mouth 4 (four) times  daily as needed.   Semaglutide , 1 MG/DOSE, 4 MG/3ML SOPN Inject 1 mg as directed once a week.   Tiotropium Bromide-Olodaterol (STIOLTO RESPIMAT) 2.5-2.5 MCG/ACT AERS Inhale 2 puffs into the lungs daily.   bacitracin  500 UNIT/GM ointment Apply 1 Application  topically 2 (two) times daily. Apply to affected area BID x 10 days then stop (Patient not taking: Reported on 11/23/2023)   ipratropium (ATROVENT ) 0.06 % nasal spray Place 2 sprays into both nostrils 2 (two) times daily.   spironolactone  (ALDACTONE ) 25 MG tablet Take 1 tablet (25 mg total) by mouth daily.   No facility-administered encounter medications on file as of 11/23/2023.     Review of Systems  Review of Systems  No chest pain with exertion.  No orthopnea or PND.  Comprehensive review of systems otherwise negative. Physical Exam  BP 106/75 (BP Location: Left Arm, Patient Position: Sitting, Cuff Size: Large)   Pulse 87   Temp 98 F (36.7 C)   Ht 5\' 4"  (1.626 m)   Wt 252 lb 6.4 oz (114.5 kg)   LMP  (LMP Unknown)   SpO2 92%   BMI 43.32 kg/m   Wt Readings from Last 5 Encounters:  11/23/23 252 lb 6.4 oz (114.5 kg)  11/14/23 253 lb (114.8 kg)  11/11/23 250 lb (113.4 kg)  10/13/23 242 lb (109.8 kg)  10/05/23 255 lb (115.7 kg)    BMI Readings from Last 5 Encounters:  11/23/23 43.32 kg/m  11/14/23 43.43 kg/m  11/11/23 42.91 kg/m  10/13/23 41.54 kg/m  10/05/23 43.77 kg/m     Physical Exam General: Sitting in chair, no acute stress Eyes: EOMI, no icterus Neck: Supple, no JVP appreciated Pulmonary: Distant, clear Cardiovascular: Warm, regular rate and rhythm Abdomen: Nondistended MSK: No synovitis, no joint effusion Neuro: Normal gait, no weakness Psych: Normal mood, full affect   Assessment & Plan:   Chronic cough: Likely related to silent reflux as well as postnasal drip, sinus issues.  Some suspicion for bronchospasm given cough with deep breaths laughing etc. chest x-ray report clear 08/2023.  Trial dual  bronchodilators with Stiolto, new prescription today  Dyspnea on exertion: Suspect multifactorial related deconditioning with multiple joint issues, chronic pain, some concern for smoking-related lung disease, occupational exposure to solvents etc.  PFTs for further evaluation.  Reported chest x-ray clear 08/2023.  Tobacco abuse: Discussed lung cancer screening.  She is precontemplative.  Polycythemia: Mild 06/2023.  High suspicion for sleep apnea.  She is not amenable to CPAP therapy for now given concerns for worsening sinus disease, etc.  We discussed alternatives.  Reevaluate in the future, I recommend a home sleep test.  Return in about 2 months (around 01/23/2024) for f/u Dr. Marygrace Snellen after PFT.   Guerry Leek, MD 11/23/2023   This appointment required 63 minutes of patient care (this includes precharting, chart review, review of results, face-to-face care, etc.).

## 2023-11-29 ENCOUNTER — Other Ambulatory Visit: Payer: Self-pay | Admitting: Family Medicine

## 2023-11-29 DIAGNOSIS — M7989 Other specified soft tissue disorders: Secondary | ICD-10-CM

## 2023-11-29 DIAGNOSIS — I700 Atherosclerosis of aorta: Secondary | ICD-10-CM

## 2023-11-29 DIAGNOSIS — I2510 Atherosclerotic heart disease of native coronary artery without angina pectoris: Secondary | ICD-10-CM

## 2023-12-01 ENCOUNTER — Other Ambulatory Visit: Payer: Self-pay

## 2023-12-01 DIAGNOSIS — J328 Other chronic sinusitis: Secondary | ICD-10-CM

## 2023-12-01 NOTE — Patient Instructions (Signed)
 Visit Information  Thank you for taking time to visit with me today. Please don't hesitate to contact me if I can be of assistance to you before our next scheduled appointment.  Our next appointment is by telephone on 01/03/24 at 3:00 Please call the care guide team at [phone] if you need to cancel or reschedule your appointment.   Following is a copy of your care plan:   Goals Addressed             This Visit's Progress    VBCI RN Care Plan   No change    Problems:  Chronic Disease Management support and education needs related to CAD, DMII, and HTN  Goal: Over the next 3 months the Patient will attend all scheduled medical appointments: 12/06/23 EGD/Colonoscopy, 11/23/23 PULM 11/14/23 PCP  as evidenced by patient report and chart review        continue to work with RN Care Manager and/or Social Worker to address care management and care coordination needs related to CAD, DMII, and HTN as evidenced by adherence to care management team scheduled appointments     demonstrate a decrease CAD, DMII, and HTN in exacerbations as evidenced by patient report and chart review  Interventions:   Evaluation of current treatment plan related to CAD, DMII, and HTN, medical treatment self-management and patient's adherence to plan as established by provider. Discussed plans with patient for ongoing care management follow up and provided patient with direct contact information for care management team Evaluation of current treatment plan related to current symptoms of self reported chronic pain, "all over body swelling" and patient's adherence to plan as established by provider Reviewed medications with patient and discussed rationale for use Reviewed scheduled/upcoming provider appointments including PCP and speciality providers as well as upcoming scheduled procedures. Discussed plans with patient for ongoing care management follow up and provided patient with direct contact information for care  management team.   Patient Self-Care Activities:  Attend all scheduled provider appointments Call pharmacy for medication refills 3-7 days in advance of running out of medications Call provider office for new concerns or questions  Perform all self care activities independently  Take medications as prescribed   Schedule visit with Ophthalmology Schedule visit with OBGYN  Plan:  Telephone follow up appointment with care management team member scheduled for:  01/03/24 at 3:00   Clarnce Crow BSN RN CCM [place]  [facility], [facility] RN Care Manager Direct Dial: [phone] Fax: [phone]             Please call the Suicide and Crisis Lifeline: 988 call the [place]  National Suicide Prevention Lifeline: (313)313-1470 or TTY: (416) 588-5500 TTY 818-278-1874) to talk to a trained counselor call 1-800-273-TALK (toll free, 24 hour hotline) if you are experiencing a Mental Health or Behavioral Health Crisis or need someone to talk to.  Patient verbalizes understanding of instructions and care plan provided today and agrees to view in MyChart. Active MyChart status and patient understanding of how to access instructions and care plan via MyChart confirmed with patient.      Clarnce Crow BSN RN CCM [place]  [facility], [facility] RN Care Manager Direct Dial: [phone] Fax: [phone]

## 2023-12-01 NOTE — Patient Outreach (Signed)
 Complex Care Management   Visit Note  12/01/2023  Name:  Theresa Marquez MRN: [id number] DOB: 13-Jan-1966  Situation: Referral received for Complex Care Management related to Diabetes with Complications and HTN I obtained verbal consent from Patient.  Visit completed with patient  on the phone  Background:   Past Medical History:  Diagnosis Date   Allergy    Chronic pain    COPD (chronic obstructive pulmonary disease) (HCC)    Coronary artery disease    Diabetes mellitus without complication (HCC)    Endometrial polyp 08/09/2017   Formatting of this note might be different from the original. S/p hysteroscopy with D&C 10/2017 with Dr Bernardino Bridge, was benign   Sleep apnea     Assessment: Patient Reported Symptoms:  Cognitive Cognitive Status: Alert and oriented to person, place, and time      Neurological Neurological Review of Symptoms:  (attributed to GERD, having EGD 12/06/23)    HEENT HEENT Symptoms Reported: Nasal discharge, Runny nose Other HEENT Symptoms/Conditions: patient reports pharmacy requires PA for Nasonex , referral to clinical pharmacist for assistance.      Cardiovascular Cardiovascular Symptoms Reported: Swelling in legs or feet, Fatigue Cardiovascular Conditions: Hypertension  Respiratory Respiratory Symptoms Reported: Dry cough, Shortness of breath Other Respiratory Symptoms: patient with chronic persistent cough, reports sometimes "blacks out"  current smoker.  fu with Pulm scheduled, also having EGD/Colon 12/06/23 Respiratory Conditions: Cough, Sleep disordered breathing (patient refuses to use CPAP)  Endocrine Patient reports the following symptoms related to hypoglycemia or hyperglycemia : No symptoms reported Is patient diabetic?:  (last A1C 5.7) Is patient checking blood sugars at home?: No Endocrine Conditions: Diabetes, Other Other Endocrine Conditions: patient reports menopause symptoms, OBGYN referral completed, patient has not scheduled visit Endocrine  Management Strategies: Medication therapy  Gastrointestinal Gastrointestinal Symptoms Reported: Other Additional Gastrointestinal Details: patient scheduled for EGD/COLON 12/06/23 Gastrointestinal Conditions: Reflux/heartburn Nutrition Risk Screen (CP): No indicators present  Genitourinary Genitourinary Symptoms Reported: No symptoms reported    Integumentary Integumentary Symptoms Reported: No symptoms reported    Musculoskeletal Musculoskelatal Symptoms Reviewed: Difficulty walking Additional Musculoskeletal Details: patient reports Celebrex  helping "somewhat" better than Tylenol         Psychosocial Psychosocial Symptoms Reported: Not assessed            11/14/2023    2:20 PM  Depression screen PHQ 2/9  Decreased Interest 0  Down, Depressed, Hopeless 0  PHQ - 2 Score 0    There were no vitals filed for this visit.  Medications Reviewed Today     Reviewed by Clarnce Crow, RN (Registered Nurse) on 12/01/23 at 1536  Med List Status: <None>   Medication Order Taking? Sig Documenting Provider Last Dose Status Informant  azelastine  (ASTELIN ) 0.1 % nasal spray [id number]  Place 2 sprays into both nostrils 2 (two) times daily. Use in each nostril as directed Artice Last, MD  Active   bacitracin  500 UNIT/GM ointment [id number] No Apply 1 Application topically 2 (two) times daily. Apply to affected area BID x 10 days then stop  Patient not taking: Reported on 12/01/2023   Soldatova, Liuba, MD Not Taking Active   celecoxib  (CELEBREX ) 200 MG capsule [id number] Yes Take 1 capsule (200 mg total) by mouth 2 (two) times daily as needed (pain). Catheryn Cluck, MD Taking Active   esomeprazole  (NEXIUM ) 40 MG capsule [id number] Yes Take 1 capsule (40 mg total) by mouth daily. Catheryn Cluck, MD Taking Active   famotidine  (PEPCID ) 40 MG tablet [id number] Yes Take 1  tablet (40 mg total) by mouth at bedtime. Catheryn Cluck, MD Taking Active   furosemide  (LASIX ) 40 MG tablet [id number] Yes  Take 1 tablet (40 mg total) by mouth daily. Hassan Links, MD Taking Active   ipratropium (ATROVENT ) 0.06 % nasal spray [id number]  Place 2 sprays into both nostrils 2 (two) times daily. Catheryn Cluck, MD  Expired 07/14/23 2359   levalbuterol  (XOPENEX  HFA) 45 MCG/ACT inhaler [id number]  Inhale 1-2 puffs into the lungs every 6 (six) hours as needed for wheezing. Catheryn Cluck, MD  Active   levocetirizine (XYZAL  ALLERGY 24HR) 5 MG tablet [id number]  Take 1 tablet (5 mg total) by mouth every evening. Soldatova, Liuba, MD  Active   mometasone  (NASONEX ) 50 MCG/ACT nasal spray [id number] No Place 2 sprays into the nose daily.  Patient not taking: Reported on 12/01/2023   Soldatova, Liuba, MD Not Taking Active            Med Note Burley Carpenter, Rickie Gange   Thu Dec 01, 2023  3:36 PM) Patient states as per pharmacy, needs PA.  Request sent to clinical pharmacist  polyethylene glycol-electrolytes (NULYTELY ) 420 g solution [id number]  Take by mouth. [provider]  Active   pravastatin  (PRAVACHOL ) 20 MG tablet [id number]  TAKE 1 TABLET BY MOUTH EVERY DAY FOR CHOLESTEROL Catheryn Cluck, MD  Active   promethazine -dextromethorphan (PROMETHAZINE -DM) 6.25-15 MG/5ML syrup [id number]  Take 5 mLs by mouth 4 (four) times daily as needed. Catheryn Cluck, MD  Active   Semaglutide , 1 MG/DOSE, 4 MG/3ML SOPN [id number]  Inject 1 mg as directed once a week. Catheryn Cluck, MD  Active   spironolactone  (ALDACTONE ) 25 MG tablet [id number]  TAKE 1 TABLET BY MOUTH EVERY DAY Catheryn Cluck, MD  Active   Tiotropium Bromide-Olodaterol (STIOLTO RESPIMAT ) 2.5-2.5 MCG/ACT AERS [id number]  Inhale 2 puffs into the lungs daily. Hunsucker, Archer Kobs, MD  Active             Recommendation:   Referral to: Clinical Pharmacist for assistance with PA for Nasonex   Follow Up Plan:   Telephone follow up appointment date/time:  01/03/24   Clarnce Crow BSN RN CCM [place]  [facility], [facility] RN  Care Manager Direct Dial: [phone] Fax: [phone]

## 2023-12-06 ENCOUNTER — Encounter: Payer: Self-pay | Admitting: Gastroenterology

## 2023-12-06 ENCOUNTER — Telehealth: Payer: Self-pay

## 2023-12-06 ENCOUNTER — Ambulatory Visit: Admitting: Gastroenterology

## 2023-12-06 VITALS — BP 146/79 | HR 86 | Temp 98.1°F | Resp 16 | Ht 64.0 in | Wt 250.0 lb

## 2023-12-06 DIAGNOSIS — Z800 Family history of malignant neoplasm of digestive organs: Secondary | ICD-10-CM

## 2023-12-06 DIAGNOSIS — R195 Other fecal abnormalities: Secondary | ICD-10-CM

## 2023-12-06 DIAGNOSIS — R110 Nausea: Secondary | ICD-10-CM

## 2023-12-06 MED ORDER — SODIUM CHLORIDE 0.9 % IV SOLN: 500.0000 mL | Freq: Once | INTRAVENOUS | Status: DC

## 2023-12-06 NOTE — Progress Notes (Unsigned)
 Patient evaluated in our preprocedure area before starting her procedure. She describes within the last week having a syncopal episode of unclear etiology. Although she has had concerns for falls with lower extremity weakness that has been documented in her workup by neurology and primary care in the past year to year and a half, she has never had a true syncope. Last echocardiogram back in 2023. Last seen by cardiology in December 2024.  In the setting of this patient's new syncope of unclear etiology, it is felt that she is not an adequate candidate for sedation in the endoscopy center today.  Although it is possible that the workup of her syncope will end up being unremarkable, it is important for her primary care doctor and cardiologist and neurologist to make sure that she is as optimized as possible before we put her through anesthesia.  I will have my team reach out to her within the next week to schedule a hospital-based outpatient EGD/colonoscopy most likely at the end of July or August to allow time for her to have a chance to see her providers and ensure nothing else is being missed.  Although the patient was hesitant about having to reschedule, she understands that it is most important to be safe and she will accept this plan of action.  I will forward this message to her providers.   Yong Henle, MD [facility] Advanced [facility]oscopy Office # [phone]

## 2023-12-06 NOTE — Telephone Encounter (Signed)
-----   Message from [facility] sent at 12/06/2023  2:17 PM EDT ----- Regarding: [facility] patient ABT and DKP and BJM, Hope you are all well. This patient came in for her EGD/colonoscopy. She told us  about a new true syncope that occurred within the last week. This is different from her prior episodes of weakness that she has discussed and described with you all in the past. As such she is not an adequate candidate for our endoscopy center for anesthesia purposes and she also felt uncomfortable with that as well. I am asking if you all have an opportunity to either see her in follow-up or ensure that there is nothing else that needs to be done from an optimization standpoint before she undergo a repeat attempt at procedures. My team is going to work on arranging a EGD/colonoscopy in the hospital-based outpatient setting in the coming weeks that will allow you all an opportunity to review her case further. She appreciates the follow-up with you all. Thanks. GM  Merced Brougham, Please schedule this patient for EGD/colonoscopy for August 2025. Thanks. GM

## 2023-12-06 NOTE — Progress Notes (Unsigned)
 Upon arrival to procedure room, pt stated that she had passed a couple days ago in her pool.  She stated she has had falls before where her lrgs just gave out, but has never LOC.  Dr Brice Campi reviewed pt chart and talked with pt himself and determined sedation will need to wait until this LOC is w/u further

## 2023-12-07 ENCOUNTER — Other Ambulatory Visit: Payer: Self-pay

## 2023-12-07 MED ORDER — NA SULFATE-K SULFATE-MG SULF 17.5-3.13-1.6 GM/177ML PO SOLN: 1.0000 | Freq: Once | ORAL | 0 refills | Status: AC

## 2023-12-07 NOTE — Telephone Encounter (Signed)
 Left message on machine to call back

## 2023-12-07 NOTE — Telephone Encounter (Signed)
 Endo colon has been set up for 02/13/24 at 1045 am at [facility] with GM

## 2023-12-08 NOTE — Telephone Encounter (Signed)
Endo colon scheduled, pt instructed and medications reviewed.  Patient instructions mailed to home.  Patient to call with any questions or concerns.  

## 2023-12-19 ENCOUNTER — Telehealth: Payer: Self-pay

## 2023-12-19 NOTE — Progress Notes (Signed)
 Complex Care Management Note  Care Guide Note 12/19/2023 Name: [name] MRN: [id number] DOB: [birth year]  Theresa Marquez is a 58 y.o. year old female who sees Sebastian Beverley NOVAK, MD for primary care. I reached out to Theresa Marquez by phone today to offer complex care management services.  Ms. Kleppe was given information about Complex Care Management services today including:   The Complex Care Management services include support from the care team which includes your Nurse Care Manager, Clinical Social Worker, or Pharmacist.  The Complex Care Management team is here to help remove barriers to the health concerns and goals most important to you. Complex Care Management services are voluntary, and the patient may decline or stop services at any time by request to their care team member.   Complex Care Management Consent Status: Patient did not agree to participate in complex care management services at this time.  Follow up plan:  Patient plans to follow up with PCP.  Encounter Outcome:  Patient Refused  Dreama Agent [facility], [facility] Care Management Assistant Direct Dial: [phone]  Fax: [phone]

## 2023-12-20 ENCOUNTER — Other Ambulatory Visit: Payer: Self-pay | Admitting: Family Medicine

## 2023-12-20 ENCOUNTER — Ambulatory Visit: Payer: Self-pay

## 2023-12-20 DIAGNOSIS — I2510 Atherosclerotic heart disease of native coronary artery without angina pectoris: Secondary | ICD-10-CM

## 2023-12-20 DIAGNOSIS — R140 Abdominal distension (gaseous): Secondary | ICD-10-CM

## 2023-12-20 DIAGNOSIS — I700 Atherosclerosis of aorta: Secondary | ICD-10-CM

## 2023-12-20 NOTE — Telephone Encounter (Signed)
 FYI Only or Action Required?: FYI only for provider.  Patient was last seen in primary care on 11/14/2023 by Sebastian Beverley NOVAK, MD. Called Nurse Triage reporting Abscess. Symptoms began a week ago. Interventions attempted: Ice/heat application. Symptoms are: unchanged.  Triage Disposition: See Physician Within 24 Hours  Patient/caregiver understands and will follow disposition?: Yes  Appt tomorrow 320 with PCP. Pt has been doing heat compresses x 1 week. Pt wants to FU with PCP on referrals that was suppose to be placed by GI or Cardiology and Neurology but doesn't want to see Dr. Tobie Neuro. Advised pt to speak with PCP tomorrow during appt.   Copied from CRM [id number]. Topic: Clinical - Red Word Triage >> Dec 20, 2023  8:35 AM Thersia BROCKS wrote: [facility] that prompted transfer to Nurse Triage: Patient called in stated she has a abscess on her face and it is painful    ----------------------------------------------------------------------- From previous Reason for Contact - Scheduling: Patient/patient representative is calling to schedule an appointment. Refer to attachments for appointment information. Reason for Disposition  [1] Boil > 1/4 inch across (> 6 mm; larger than a pencil eraser) AND [2] on face  Answer Assessment - Initial Assessment Questions 1. APPEARANCE of BOIL: What does the boil look like?      Redness and swelling 2. LOCATION: Where is the boil located?      On face, corner of mouth  3. NUMBER: How many boils are there?      1 4. SIZE: How big is the boil? (e.g., inches, cm; compare to size of a coin or other object)     1  5. ONSET: When did the boil start?     1 week  6. PAIN: Is there any pain? If Yes, ask: How bad is the pain?   (Scale 1-10; or mild, moderate, severe)     Painful  7. FEVER: Do you have a fever? If Yes, ask: What is it, how was it measured, and when did it start?      no 9. OTHER SYMPTOMS: Do you have any other symptoms?  (e.g., shaking chills, weakness, rash elsewhere on body)  Protocols used: Boil (Skin Abscess)-A-AH

## 2023-12-21 ENCOUNTER — Ambulatory Visit (INDEPENDENT_AMBULATORY_CARE_PROVIDER_SITE_OTHER): Admitting: Family Medicine

## 2023-12-21 ENCOUNTER — Ambulatory Visit (HOSPITAL_BASED_OUTPATIENT_CLINIC_OR_DEPARTMENT_OTHER)
Admission: RE | Admit: 2023-12-21 | Discharge: 2023-12-21 | Disposition: A | Discharge status: Home / Self Care | Source: Ambulatory Visit | Attending: Family Medicine | Admitting: Family Medicine

## 2023-12-21 ENCOUNTER — Encounter: Payer: Self-pay | Admitting: Family Medicine

## 2023-12-21 VITALS — BP 114/66 | HR 107 | Temp 98.6°F | Ht 64.0 in | Wt 243.2 lb

## 2023-12-21 DIAGNOSIS — R220 Localized swelling, mass and lump, head: Secondary | ICD-10-CM | POA: Diagnosis not present

## 2023-12-21 DIAGNOSIS — L03211 Cellulitis of face: Secondary | ICD-10-CM

## 2023-12-21 DIAGNOSIS — K029 Dental caries, unspecified: Secondary | ICD-10-CM | POA: Diagnosis not present

## 2023-12-21 LAB — CBC WITH DIFFERENTIAL/PLATELET
Basophils Absolute: 0.1 10*3/uL (ref 0.0–0.1)
Basophils Relative: 0.5 % (ref 0.0–3.0)
Eosinophils Absolute: 0.3 10*3/uL (ref 0.0–0.7)
Eosinophils Relative: 1.9 % (ref 0.0–5.0)
HCT: 43.9 % (ref 36.0–46.0)
Hemoglobin: 14.6 g/dL (ref 12.0–15.0)
Lymphocytes Relative: 21.7 % (ref 12.0–46.0)
Lymphs Abs: 3.8 10*3/uL (ref 0.7–4.0)
MCHC: 33.3 g/dL (ref 30.0–36.0)
MCV: 99.2 fl (ref 78.0–100.0)
Monocytes Absolute: 0.8 10*3/uL (ref 0.1–1.0)
Monocytes Relative: 4.4 % (ref 3.0–12.0)
Neutro Abs: 12.7 10*3/uL — ABNORMAL HIGH (ref 1.4–7.7)
Neutrophils Relative %: 71.5 % (ref 43.0–77.0)
Platelets: 424 10*3/uL — ABNORMAL HIGH (ref 150.0–400.0)
RBC: 4.42 Mil/uL (ref 3.87–5.11)
RDW: 16.7 % — ABNORMAL HIGH (ref 11.5–15.5)
WBC: 17.7 10*3/uL — ABNORMAL HIGH (ref 4.0–10.5)

## 2023-12-21 LAB — COMPREHENSIVE METABOLIC PANEL WITH GFR
ALT: 24 U/L (ref 0–35)
AST: 20 U/L (ref 0–37)
Albumin: 3.5 g/dL (ref 3.5–5.2)
Alkaline Phosphatase: 121 U/L — ABNORMAL HIGH (ref 39–117)
BUN: 7 mg/dL (ref 6–23)
CO2: 29 meq/L (ref 19–32)
Calcium: 9.5 mg/dL (ref 8.4–10.5)
Chloride: 97 meq/L (ref 96–112)
Creatinine, Ser: 0.82 mg/dL (ref 0.40–1.20)
GFR: 78.89 mL/min (ref >60.00)
Glucose, Bld: 131 mg/dL — ABNORMAL HIGH (ref 70–99)
Potassium: 3.2 meq/L — ABNORMAL LOW (ref 3.5–5.1)
Sodium: 134 meq/L — ABNORMAL LOW (ref 135–145)
Total Bilirubin: 0.4 mg/dL (ref 0.2–1.2)
Total Protein: 7.8 g/dL (ref 6.0–8.3)

## 2023-12-21 LAB — C-REACTIVE PROTEIN: CRP: 10.3 mg/dL (ref 0.5–20.0)

## 2023-12-21 MED ORDER — DOXYCYCLINE HYCLATE 100 MG PO TABS: 100.0000 mg | ORAL_TABLET | Freq: Two times a day (BID) | ORAL | 0 refills | Status: AC

## 2023-12-21 MED ORDER — IOHEXOL 300 MG/ML  SOLN
75.0000 mL | Freq: Once | INTRAMUSCULAR | Status: AC | PRN
  Administered 2023-12-21: 75 mL via INTRAVENOUS

## 2023-12-21 NOTE — Patient Instructions (Signed)
  VISIT SUMMARY: You came in today due to a rapidly progressing facial infection that started as a small pimple at the corner of your mouth. The infection has caused significant swelling and pain, but you do not have a fever or chills.  YOUR PLAN: -FACIAL CELLULITIS: Facial cellulitis is a bacterial skin infection that causes redness, swelling, and pain. It started from a pimple at the corner of your mouth and has spread to your cheek and near your eye. We have prescribed doxycycline  100 mg to be taken twice daily to help fight the infection. You need to see an ENT specialist urgently and get a facial CT scan and blood work done immediately to assess the extent of the infection. If your symptoms worsen, such as increased pain, fever, chills, nausea, or vomiting, you should go to the emergency department right away.  INSTRUCTIONS: Please take doxycycline  100 mg twice daily as prescribed. Schedule an urgent appointment with an ENT specialist and get a facial CT scan and blood work (CMP, CBC, and CRP) done immediately. If you experience worsening pain, fever, chills, nausea, or vomiting, go to the emergency department immediately.

## 2023-12-21 NOTE — Progress Notes (Signed)
 Assessment & Plan   Assessment/Plan:    Assessment & Plan Facial Cellulitis Rapidly progressing facial cellulitis originating from a pimple-like lesion at the corner of the mouth, extending to the lower cheek and base of the right lip, with tenderness on palpation. No fever, chills, or drainage, but significant pain is present. No evidence of sinus or periorbital involvement. Discussed potential for periorbital cellulitis if the infection spreads, which could necessitate emergency intervention. Outpatient management is appropriate, with a low threshold for emergency department referral if symptoms worsen. Urgent ENT evaluation and imaging are needed to assess infection extent and potential IV antibiotic requirement if condition deteriorates. - Prescribe doxycycline  100 mg BID - Refer urgently to ENT specialist - Order stat facial CT scan - Order stat blood work including CMP, CBC, and CRP - Discuss emergency department precautions including worsening pain, fevers, chills, nausea, vomiting      There are no discontinued medications.  Return if symptoms worsen or fail to improve.        Subjective:   Encounter date: 12/21/2023  Theresa Marquez is a 58 y.o. female who has Family history of thyroid  disease; Fatigue; Neck swelling; Obesity, morbid, BMI 40.0-49.9 (HCC); Tobacco abuse; Bilateral lower extremity edema; Recurrent sinus infections; Rhinitis; OSA (obstructive sleep apnea); AKI (acute kidney injury) (HCC); Wheezing; Chronic bilateral low back pain with bilateral sciatica; Allergy; Cervicalgia; Sleep disturbance; Pruritic dermatitis; Weakness of extremity; Seizure-like activity (HCC); Neuralgia; Rash of both hands; Syncope; Frequent falls; Lumbar foraminal stenosis; Small vessel disease (HCC); Closed fracture of tooth; Sedimentation rate elevation; Pain in thoracic spine; Paresthesia; Myalgia due to statin; Lymphadenopathy; Positive colorectal cancer screening using Cologuard  test; Class 3 severe obesity due to excess calories with serious comorbidity and body mass index (BMI) of 45.0 to 49.9 in adult; Abdominal bloating; Depression, major, single episode, moderate (HCC); Small airways disease; Colon, diverticulosis; Pulmonary artery hypertension (HCC); Coronary artery disease involving native coronary artery of native heart without angina pectoris; Aortic atherosclerosis (HCC); Umbilical hernia without obstruction and without gangrene; Tonsillitis and adenoiditis, chronic; Hepatomegaly; Metabolic dysfunction-associated steatotic liver disease (MASLD); Type 2 diabetes mellitus without complication, with long-term current use of insulin (HCC); Simple chronic bronchitis (HCC); Breast asymmetry in left breast on screeing mammogram; Mass of upper outer quadrant of left breast; Chronic cough; Nausea; Hoarseness; Vasomotor symptoms due to menopause; Gastroesophageal reflux disease; Visual disturbances; and Drug side effects on their problem list..   She  has a past medical history of Allergy, Chronic pain, COPD (chronic obstructive pulmonary disease) (HCC), Coronary artery disease, Diabetes mellitus without complication (HCC), Endometrial polyp (08/09/2017), and Sleep apnea..   She presents with chief complaint of Skin Infection .   Discussed the use of AI scribe software for clinical note transcription with the patient, who gave verbal consent to proceed.  History of Present Illness Theresa Marquez is a 58 year old female who presents with a rapidly progressing facial infection.  The facial infection began as a small pimple at the corner of her mouth. Initially managed with warm compresses, it rapidly progressed to a marble-sized swelling and then significantly increased in size overnight. The swelling is painful, and she has considered drastic measures due to the discomfort.  She experiences swelling in her cheek, near her mouth, and up near her eye. Despite the  swelling, she has no fever or chills. Warm compresses have facilitated drainage, but the pain persists.  She is currently taking celecoxib  200 mg twice daily for pain management. She has been  in contact with the medical office, seeking advice on whether to visit urgent care or manage the condition at home, but found it difficult to convey the severity of her symptoms over the phone.  She has tolerated some food and drink although it is decreased due to pain.   No fever, chills,     ROS  Past Surgical History:  Procedure Laterality Date   CHOLECYSTECTOMY     DG GALL BLADDER Right     Outpatient Medications Prior to Visit  Medication Sig Dispense Refill   azelastine  (ASTELIN ) 0.1 % nasal spray Place 2 sprays into both nostrils 2 (two) times daily. Use in each nostril as directed 30 mL 12   celecoxib  (CELEBREX ) 200 MG capsule Take 1 capsule (200 mg total) by mouth 2 (two) times daily as needed (pain). 180 capsule 0   esomeprazole  (NEXIUM ) 40 MG capsule TAKE 1 CAPSULE BY MOUTH EVERY DAY 30 capsule 3   famotidine  (PEPCID ) 40 MG tablet Take 1 tablet (40 mg total) by mouth at bedtime. 90 tablet 3   furosemide  (LASIX ) 40 MG tablet Take 1 tablet (40 mg total) by mouth daily. 90 tablet 3   ipratropium (ATROVENT ) 0.06 % nasal spray Place 2 sprays into both nostrils 2 (two) times daily. 12 mL 2   levalbuterol  (XOPENEX  HFA) 45 MCG/ACT inhaler Inhale 1-2 puffs into the lungs every 6 (six) hours as needed for wheezing. 15 g 0   levocetirizine (XYZAL  ALLERGY 24HR) 5 MG tablet Take 1 tablet (5 mg total) by mouth every evening. 30 tablet 3   pravastatin  (PRAVACHOL ) 20 MG tablet TAKE 1 TABLET BY MOUTH EVERY DAY FOR CHOLESTEROL 90 tablet 0   Semaglutide , 1 MG/DOSE, 4 MG/3ML SOPN Inject 1 mg as directed once a week. 9 mL 0   spironolactone  (ALDACTONE ) 25 MG tablet TAKE 1 TABLET BY MOUTH EVERY DAY 90 tablet 3   Tiotropium Bromide-Olodaterol (STIOLTO RESPIMAT ) 2.5-2.5 MCG/ACT AERS Inhale 2 puffs into the  lungs daily. 1 each 11   bacitracin  500 UNIT/GM ointment Apply 1 Application topically 2 (two) times daily. Apply to affected area BID x 10 days then stop (Patient not taking: Reported on 12/21/2023) 15 g 0   mometasone  (NASONEX ) 50 MCG/ACT nasal spray Place 2 sprays into the nose daily. (Patient not taking: Reported on 12/21/2023) 1 each 12   polyethylene glycol-electrolytes (NULYTELY ) 420 g solution Take by mouth. (Patient not taking: Reported on 12/21/2023)     promethazine -dextromethorphan (PROMETHAZINE -DM) 6.25-15 MG/5ML syrup Take 5 mLs by mouth 4 (four) times daily as needed. (Patient not taking: Reported on 12/21/2023) 118 mL 0   No facility-administered medications prior to visit.    Family History  Problem Relation Age of Onset   Kidney disease Mother    Rectal cancer Father    Colon cancer Father    Cancer Father    Colon cancer Maternal Grandmother    Cerebral palsy Daughter    Breast cancer Neg Hx    Crohn's disease Neg Hx    Colon polyps Neg Hx    Esophageal cancer Neg Hx    Stomach cancer Neg Hx     Social History   Socioeconomic History   Marital status: Divorced    Spouse name: Not on file   Number of children: 3   Years of education: Not on file   Highest education level: Associate degree: academic program  Occupational History   Occupation: [employer]    Comment: Full-Time.   Occupation: unemployed  Tobacco Use  Smoking status: Every Day    Current packs/day: 0.25    Average packs/day: 0.3 packs/day for 54.2 years (13.5 ttl pk-yrs)    Types: Cigarettes    Start date: 10/26/1969   Smokeless tobacco: Never   Tobacco comments:    Smoking cessation  Vaping Use   Vaping status: Never Used  Substance and Sexual Activity   Alcohol use: Not Currently    Comment: socially   Drug use: Never   Sexual activity: Not Currently    Birth control/protection: Post-menopausal  Other Topics Concern   Not on file  Social History Narrative   Right Handed    Lives in a  one story home with a basement   Social Drivers of Health   Financial Resource Strain: High Risk (06/13/2023)   Overall Financial Resource Strain (CARDIA)    Difficulty of Paying Living Expenses: Very hard  Food Insecurity: No Food Insecurity (12/01/2023)   Hunger Vital Sign    Worried About Running Out of Food in the Last Year: Never true    Ran Out of Food in the Last Year: Never true  Transportation Needs: No Transportation Needs (12/01/2023)   PRAPARE - Administrator, Civil Service (Medical): No    Lack of Transportation (Non-Medical): No  Physical Activity: Unknown (06/13/2023)   Exercise Vital Sign    Days of Exercise per Week: 0 days    Minutes of Exercise per Session: Not on file  Stress: No Stress Concern Present (06/13/2023)   [employer] of Occupational Health - Occupational Stress Questionnaire    Feeling of Stress : Only a little  Social Connections: Moderately Isolated (06/13/2023)   Social Connection and Isolation Panel    Frequency of Communication with Friends and Family: Three times a week    Frequency of Social Gatherings with Friends and Family: Never    Attends Religious Services: 1 to 4 times per year    Active Member of [employer] or Organizations: No    Attends Engineer, structural: Not on file    Marital Status: Divorced  Intimate Partner Violence: Not At Risk (12/01/2023)   Humiliation, Afraid, Rape, and Kick questionnaire    Fear of Current or Ex-Partner: No    Emotionally Abused: No    Physically Abused: No    Sexually Abused: No                                                                                                  Objective:  Physical Exam: BP 114/66   Pulse (!) 107   Temp 98.6 F (37 C) (Temporal)   Ht 5' 4 (1.626 m)   Wt 243 lb 3.2 oz (110.3 kg)   LMP  (LMP Unknown)   SpO2 97%   BMI 41.75 kg/m    Physical Exam GENERAL: Alert, cooperative, well developed, no acute distress. HEENT: Normocephalic, normal  oropharynx, moist mucous membranes. Facial cellulitis on right lower cheek, attached to base of right lip, tender on palpation, no drainage, not tracking to sinus or periorbital area. CHEST: Clear to auscultation bilaterally. No wheezes, rhonchi, or  crackles. CARDIOVASCULAR: Normal heart rate and rhythm, S1 and S2 normal without murmurs. ABDOMEN: Soft, non-tender, non-distended, without organomegaly. Normal bowel sounds. EXTREMITIES: No cyanosis or edema. NEUROLOGICAL: Cranial nerves grossly intact. Moves all extremities without gross motor or sensory deficit.      Physical Exam  No results found.  Recent Results (from the past 2160 hours)  POCT glycosylated hemoglobin (Hb A1C)     Status: Abnormal   Collection Time: 11/14/23  2:38 PM  Result Value Ref Range   Hemoglobin A1C 5.7 (A) 4.0 - 5.6 %   HbA1c POC (<> result, manual entry)     HbA1c, POC (prediabetic range)     HbA1c, POC (controlled diabetic range)          Beverley Adine Hummer, MD, MS

## 2023-12-22 ENCOUNTER — Ambulatory Visit: Payer: Self-pay | Admitting: Family Medicine

## 2024-01-03 ENCOUNTER — Other Ambulatory Visit: Payer: Self-pay

## 2024-01-03 DIAGNOSIS — R569 Unspecified convulsions: Secondary | ICD-10-CM

## 2024-01-03 NOTE — Patient Instructions (Addendum)
 Visit Information  Theresa Marquez was given information about Medicaid Managed Care team care coordination services as a part of their Healthy [facility] Medicaid benefit. Theresa Marquez verbally consented to engagement with the [facility] Managed Care team.   If you are experiencing a medical emergency, please call 911 or report to your local emergency department or urgent care.   If you have a non-emergency medical problem during routine business hours, please contact your provider's office and ask to speak with a nurse.   For questions related to your Healthy [facility] health plan, please call: [phone] or visit the homepage here: [email or web address]  If you would like to schedule transportation through your Healthy [facility] plan, please call the following number at least 2 days in advance of your appointment: [phone]  For information about your ride after you set it up, call Ride Assist at [phone]. Use this number to activate a Will Call pickup, or if your transportation is late for a scheduled pickup. Use this number, too, if you need to make a change or cancel a previously scheduled reservation.  If you need transportation services right away, call [phone]. The after-hours call center is staffed 24 hours to handle ride assistance and urgent reservation requests (including discharges) 365 days a year. Urgent trips include sick visits, hospital discharge requests and life-sustaining treatment.  Call the [facility] Line at [phone], at any time, 24 hours a day, 7 days a week. If you are in danger or need immediate medical attention call 911.  If you would like help to quit smoking, call 1-800-QUIT-NOW (312-584-9662) OR Espaol: 1-855-Djelo-Ya (8-144-664-6430) o para ms informacin haga clic aqu or Text READY to 799-599 to register via text  Theresa Marquez - following are the goals we discussed in your visit today:   Goals  Addressed             This Visit's Progress    VBCI RN Care Plan   Improving    Problems:  Chronic Disease Management support and education needs related to CAD, DMII, and HTN  Goal: Over the next 3 months the Patient will attend all scheduled medical appointments: 01/20/24 Pulmonology, 02/06/24 ENT, 02/14/24 PCP as evidenced by patient report and chart review        continue to work with RN Care Manager and/or Social Worker to address care management and care coordination needs related to CAD, DMII, and HTN as evidenced by adherence to care management team scheduled appointments     demonstrate a decrease CAD, DMII, and HTN in exacerbations as evidenced by patient report and chart review Obtain cardiology and neurology clearance in order to have scheduled EGD/COLON scheduled for 02/13/24  Interventions:   Evaluation of current treatment plan related to CAD, DMII, and HTN, medical treatment self-management and patient's adherence to plan as established by provider. Discussed plans with patient for ongoing care management follow up and provided patient with direct contact information for care management team Evaluation of current treatment plan related to current symptoms of self reported chronic pain, all over body swelling and patient's adherence to plan as established by provider Reviewed medications with patient and discussed rationale for use Reviewed scheduled/upcoming provider appointments including PCP and speciality providers as well as upcoming scheduled procedures. Discussed plans with patient for ongoing care management follow up and provided patient with direct contact information for care management team.   Patient Self-Care Activities:  Attend all scheduled provider appointments Call pharmacy for medication refills 3-7 days in  advance of running out of medications Call provider office for new concerns or questions  Perform all self care activities independently  Take  medications as prescribed   Schedule visit with Ophthalmology Schedule visit with OBGYN Schedule pre surgical clearance with cardiologist and neurologist  Plan:  Telephone follow up appointment with care management team member scheduled for:  01/17/24 at 3:00   Olam Idol BSN RN CCM [place]  [facility], [facility] Health RN Care Manager Direct Dial: [phone] Fax: [phone]                Patient verbalizes understanding of instructions and care plan provided today and agrees to view in MyChart. Active MyChart status and patient understanding of how to access instructions and care plan via MyChart confirmed with patient.     RN Care Manager will request a referral to Neurology as per your request.  I will also send a message to our Clinical Pharmacist about the PA for Nasonex  Telephone follow up appointment with Managed Medicaid care management team member scheduled for: 01/17/24 at 3:00  SIG  Olam Idol BSN RN CCM [place]  [facility], [facility] Health RN Care Manager Direct Dial: [phone] Fax: [phone]   Following is a copy of your plan of care:   VBCI CM Care Plan/Goals Addressed Today  VBCI Care Management Care Plan/Goals Addressed this Encounter     Goals Addressed                     This Visit's Progress     VBCI RN Care Plan     Improving      Problems:  Chronic Disease Management support and education needs related to CAD, DMII, and HTN   Goal: Over the next 3 months the Patient will attend all scheduled medical appointments: 01/20/24 Pulmonology, 02/06/24 ENT, 02/14/24 PCP as evidenced by patient report and chart review        continue to work with RN Care Manager and/or Social Worker to address care management and care coordination needs related to CAD, DMII, and HTN as evidenced by adherence to care management team scheduled appointments     demonstrate a decrease CAD, DMII, and HTN in  exacerbations as evidenced by patient report and chart review Obtain cardiology and neurology clearance in order to have scheduled EGD/COLON scheduled for 02/13/24   Interventions:    Evaluation of current treatment plan related to CAD, DMII, and HTN, medical treatment self-management and patient's adherence to plan as established by provider. Discussed plans with patient for ongoing care management follow up and provided patient with direct contact information for care management team Evaluation of current treatment plan related to current symptoms of self reported chronic pain, all over body swelling and patient's adherence to plan as established by provider Reviewed medications with patient and discussed rationale for use Reviewed scheduled/upcoming provider appointments including PCP and speciality providers as well as upcoming scheduled procedures. Discussed plans with patient for ongoing care management follow up and provided patient with direct contact information for care management team.   Patient Self-Care Activities:  Attend all scheduled provider appointments Call pharmacy for medication refills 3-7 days in advance of running out of medications Call provider office for new concerns or questions  Perform all self care activities independently  Take medications as prescribed   Schedule visit with Ophthalmology Schedule visit with OBGYN Schedule pre surgical clearance with cardiologist and neurologist   Plan:  Telephone follow up appointment with  care management team member scheduled for:  01/17/24 at 3:00     Olam Idol BSN RN CCM [place]  [facility], [facility] Health RN Care Manager Direct Dial: [phone] Fax: [phone]        There are no care plans that you recently modified to display for this patient.

## 2024-01-03 NOTE — Patient Outreach (Signed)
 Complex Care Management   Visit Note  01/03/2024  Name:  Theresa Marquez MRN: [id number] DOB: 14-Aug-1965  Situation: Referral received for Complex Care Management related to Chronic Sinusitis I obtained verbal consent from Patient.  Visit completed with patient  on the phone  Background:   Past Medical History:  Diagnosis Date   Allergy    Chronic pain    COPD (chronic obstructive pulmonary disease) (HCC)    Coronary artery disease    Diabetes mellitus without complication (HCC)    Endometrial polyp 08/09/2017   Formatting of this note might be different from the original. S/p hysteroscopy with D&C 10/2017 with Dr Janeen, was benign   Sleep apnea     Assessment: Patient Reported Symptoms:  Cognitive Cognitive Status: Alert and oriented to person, place, and time      Neurological Neurological Review of Symptoms: Weakness, Numbness (bilateral arm numbness and weakness left leg weakness) Neurological Management Strategies: Medication therapy, Routine screening  HEENT HEENT Symptoms Reported: Nasal discharge, Other: (patient has chronic sinusitis, ENT visit 02/06/24  new episode of facial cellulitis treated with abx, no resolved) HEENT Management Strategies: Medication therapy, Routine screening    Cardiovascular Cardiovascular Symptoms Reported: Swelling in legs or feet Does patient have uncontrolled Hypertension?: No Cardiovascular Management Strategies: Routine screening  Respiratory Respiratory Symptoms Reported: No symptoms reported Other Respiratory Symptoms: patient reports coughing significantly improved Respiratory Management Strategies: Routine screening  Endocrine Endocrine Symptoms Reported: No symptoms reported Is patient diabetic?: Yes Is patient checking blood sugars at home?: No    Gastrointestinal Additional Gastrointestinal Details: EGD/COLON rescheduled for 02/13/24  patient needs neuro and cardiology clearance prior to procedure      Genitourinary  Genitourinary Symptoms Reported: No symptoms reported    Integumentary Integumentary Symptoms Reported: Other Other Integumentary Symptoms: facial cellulitis treated with abx Skin Management Strategies: Routine screening, Medication therapy  Musculoskeletal Musculoskelatal Symptoms Reviewed: Muscle pain Musculoskeletal Management Strategies: Routine screening, Medication therapy Falls in the past year?: No    Psychosocial Psychosocial Symptoms Reported: Not assessed            12/21/2023    3:23 PM  Depression screen PHQ 2/9  Decreased Interest 0  Down, Depressed, Hopeless 0  PHQ - 2 Score 0  Altered sleeping 3  Tired, decreased energy 3  Change in appetite 3  Feeling bad or failure about yourself  0  Trouble concentrating 0  Moving slowly or fidgety/restless 0  Suicidal thoughts 0  PHQ-9 Score 9  Difficult doing work/chores Not difficult at all    There were no vitals filed for this visit.  Medications Reviewed Today     Reviewed by Lonzell Planas, RN (Registered Nurse) on 01/03/24 at 1433  Med List Status: <None>   Medication Order Taking? Sig Documenting Provider Last Dose Status Informant  azelastine  (ASTELIN ) 0.1 % nasal spray [id number] Yes Place 2 sprays into both nostrils 2 (two) times daily. Use in each nostril as directed Okey Burns, MD  Active   bacitracin  500 UNIT/GM ointment [id number]  Apply 1 Application topically 2 (two) times daily. Apply to affected area BID x 10 days then stop  Patient not taking: Reported on 01/03/2024   Soldatova, Liuba, MD  Active   celecoxib  (CELEBREX ) 200 MG capsule [id number] Yes Take 1 capsule (200 mg total) by mouth 2 (two) times daily as needed (pain). Sebastian Beverley NOVAK, MD  Active   esomeprazole  (NEXIUM ) 40 MG capsule [id number] Yes TAKE 1 CAPSULE BY MOUTH EVERY DAY Sebastian Beverley  B, MD  Active   famotidine  (PEPCID ) 40 MG tablet [id number] Yes Take 1 tablet (40 mg total) by mouth at bedtime. Sebastian Beverley NOVAK, MD  Active    furosemide  (LASIX ) 40 MG tablet [id number] Yes Take 1 tablet (40 mg total) by mouth daily. Monetta Redell PARAS, MD  Active   ipratropium (ATROVENT ) 0.06 % nasal spray [id number]  Place 2 sprays into both nostrils 2 (two) times daily. Sebastian Beverley NOVAK, MD  Expired 12/21/23 2359   levalbuterol  (XOPENEX  HFA) 45 MCG/ACT inhaler [id number] Yes Inhale 1-2 puffs into the lungs every 6 (six) hours as needed for wheezing. Sebastian Beverley NOVAK, MD  Active   levocetirizine (XYZAL  ALLERGY 24HR) 5 MG tablet [id number] Yes Take 1 tablet (5 mg total) by mouth every evening. Soldatova, Liuba, MD  Active   mometasone  (NASONEX ) 50 MCG/ACT nasal spray [id number] Yes Place 2 sprays into the nose daily. Soldatova, Liuba, MD  Active            Med Note DANICE, Teyana Pierron   Thu Dec 01, 2023  3:36 PM) Patient states as per pharmacy, needs PA.  Request sent to clinical pharmacist  polyethylene glycol-electrolytes (NULYTELY ) 420 g solution [id number] Yes Take by mouth. [provider]  Active   pravastatin  (PRAVACHOL ) 20 MG tablet [id number] Yes TAKE 1 TABLET BY MOUTH EVERY DAY FOR CHOLESTEROL Sebastian Beverley NOVAK, MD  Active   promethazine -dextromethorphan (PROMETHAZINE -DM) 6.25-15 MG/5ML syrup [id number]  Take 5 mLs by mouth 4 (four) times daily as needed.  Patient not taking: Reported on 12/21/2023   Sebastian Beverley NOVAK, MD  Active   Semaglutide , 1 MG/DOSE, 4 MG/3ML SOPN [id number] Yes Inject 1 mg as directed once a week. Sebastian Beverley NOVAK, MD  Active   spironolactone  (ALDACTONE ) 25 MG tablet [id number] Yes TAKE 1 TABLET BY MOUTH EVERY DAY Sebastian Beverley NOVAK, MD  Active   Tiotropium Bromide-Olodaterol (STIOLTO RESPIMAT ) 2.5-2.5 MCG/ACT AERS [id number] Yes Inhale 2 puffs into the lungs daily. Hunsucker, Donnice SAUNDERS, MD  Active             Recommendation:   Specialty provider follow-up Request sent to PCP to make referral to new neurologist as per patient request Patient to schedule presurgical clearance appointments with cardiology and  neurology prior to EGD/COLON scheduled for 02/13/24 Message sent to Clinical Pharmacist to assist with PA for Nasonex   Follow Up Plan:   Telephone follow up appointment date/time:  01/17/24   Olam Idol BSN RN CCM [place]  [facility], [facility] Health RN Care Manager Direct Dial: [phone] Fax: [phone]

## 2024-01-04 NOTE — Addendum Note (Signed)
 Addended by: SEBASTIAN RIGHTER B on: 01/04/2024 01:16 PM   Modules accepted: Orders

## 2024-01-17 ENCOUNTER — Other Ambulatory Visit: Payer: Self-pay

## 2024-01-17 NOTE — Patient Instructions (Signed)
 Visit Information  Theresa Marquez was given information about Medicaid Managed Care team care coordination services as a part of their Healthy [facility] Medicaid benefit. Theresa Marquez verbally consented to engagement with the [facility] Managed Care team.   If you are experiencing a medical emergency, please call 911 or report to your local emergency department or urgent care.   If you have a non-emergency medical problem during routine business hours, please contact your provider's office and ask to speak with a nurse.   For questions related to your Healthy [facility] health plan, please call: [phone] or visit the homepage here: [email or web address]  If you would like to schedule transportation through your Healthy [facility] plan, please call the following number at least 2 days in advance of your appointment: [phone]  For information about your ride after you set it up, call Ride Assist at [phone]. Use this number to activate a Will Call pickup, or if your transportation is late for a scheduled pickup. Use this number, too, if you need to make a change or cancel a previously scheduled reservation.  If you need transportation services right away, call [phone]. The after-hours call center is staffed 24 hours to handle ride assistance and urgent reservation requests (including discharges) 365 days a year. Urgent trips include sick visits, hospital discharge requests and life-sustaining treatment.  Call the [facility] Line at [phone], at any time, 24 hours a day, 7 days a week. If you are in danger or need immediate medical attention call 911.  If you would like help to quit smoking, call 1-800-QUIT-NOW (579-549-7857) OR Espaol: 1-855-Djelo-Ya (8-144-664-6430) o para ms informacin haga clic aqu or Text READY to 799-599 to register via text  Theresa Marquez - following are the goals we discussed in your visit today:   Goals  Addressed             This Visit's Progress    COMPLETED: VBCI RN Care Plan   No change    Problems:  Chronic Disease Management support and education needs related to CAD, DMII, and HTN  Goal: Over the next 3 months the Patient will attend all scheduled medical appointments: 01/20/24 Pulmonology, 02/06/24 ENT, 02/14/24 PCP as evidenced by patient report and chart review        continue to work with RN Care Manager and/or Social Worker to address care management and care coordination needs related to CAD, DMII, and HTN as evidenced by adherence to care management team scheduled appointments     demonstrate a decrease CAD, DMII, and HTN in exacerbations as evidenced by patient report and chart review Obtain cardiology and neurology clearance in order to have scheduled EGD/COLON scheduled for 02/13/24  Interventions:   Evaluation of current treatment plan related to CAD, DMII, and HTN, medical treatment self-management and patient's adherence to plan as established by provider. Discussed plans with patient for ongoing care management follow up and provided patient with direct contact information for care management team Evaluation of current treatment plan related to current symptoms of self reported chronic pain, all over body swelling and patient's adherence to plan as established by provider Reviewed medications with patient and discussed rationale for use Reviewed scheduled/upcoming provider appointments including PCP and speciality providers as well as upcoming scheduled procedures. Discussed plans with patient for ongoing care management follow up and provided patient with direct contact information for care management team.   Patient Self-Care Activities:  Attend all scheduled provider appointments Call pharmacy for medication refills 3-7  days in advance of running out of medications Call provider office for new concerns or questions  Perform all self care activities independently   Take medications as prescribed   Schedule visit with Ophthalmology Schedule visit with OBGYN Schedule pre surgical clearance with cardiologist and neurologist Patient has not improved in completing any of the above goals, declines to continue MM CCM RNCM assistance.  Plan:  The patient has been provided with contact information for the care management team and has been advised to call with any health related questions or concerns.    Olam Idol BSN RN CCM [place]  [facility], [facility] Health RN Care Manager Direct Dial: [phone] Fax: [phone]             Please see education materials related to  provided    Patient verbalizes understanding of instructions and care plan provided today and agrees to view in MyChart. Active MyChart status and patient understanding of how to access instructions and care plan via MyChart confirmed with patient.     No further follow up required: Patient has declined continued enrollment in MM RNCM   SIG  Olam Idol BSN RN CCM [place]  [facility], [facility] RN Care Manager Direct Dial: [phone] Fax: [phone]   Following is a copy of your plan of care:  There are no care plans that you recently modified to display for this patient.

## 2024-01-17 NOTE — Patient Outreach (Signed)
 Complex Care Management   Visit Note  01/17/2024  Name:  Theresa Marquez MRN: [id number] DOB: [birth year]  Situation: Referral received for Complex Care Management related to Diabetes with Complications I obtained verbal consent from Patient.  Visit completed with patient  on the phone  Background:   Past Medical History:  Diagnosis Date   Allergy    Chronic pain    COPD (chronic obstructive pulmonary disease) (HCC)    Coronary artery disease    Diabetes mellitus without complication (HCC)    Endometrial polyp 08/09/2017   Formatting of this note might be different from the original. S/p hysteroscopy with [facility] 10/2017 with Dr Janeen, was benign   Sleep apnea     Assessment: Patient Reported Symptoms:  Cognitive Cognitive Status: Alert and oriented to person, place, and time, No symptoms reported      Neurological Neurological Review of Symptoms: Weakness, Numbness (patient reports constant weakness and numbness due to back pain.  declined referral to ortho or PT, stating it never does anything and doctors just tell me theres nothing wrong with me) Neurological Management Strategies: Medication therapy, Routine screening  HEENT HEENT Symptoms Reported:  (patient reports facial cellulitis has completely resolved) HEENT Management Strategies: Routine screening    Cardiovascular Cardiovascular Symptoms Reported: Swelling in legs or feet (patient reports LE edema is constant) Does patient have uncontrolled Hypertension?: No Cardiovascular Management Strategies: Routine screening  Respiratory Respiratory Symptoms Reported: No symptoms reported    Endocrine Endocrine Symptoms Reported: No symptoms reported Is patient diabetic?: Yes Is patient checking blood sugars at home?: No    Gastrointestinal Additional Gastrointestinal Details: patient was provided with new neuro referral to get surgical clearance, and will call her cardiologist to get appointment for surgical clearance.       Genitourinary Genitourinary Symptoms Reported: No symptoms reported    Integumentary Integumentary Symptoms Reported: No symptoms reported Other Integumentary Symptoms: patient reports sx resolved. Skin Management Strategies: Medication therapy  Musculoskeletal Musculoskelatal Symptoms Reviewed: Difficulty walking, Muscle pain Additional Musculoskeletal Details: patient reports increased back pain, can barely walk.  RNCM offered to assist in placing referral to ortho or PT, patient declined stating those don't help Musculoskeletal Management Strategies: Adequate rest, Coping strategies, Routine screening Falls in the past year?: No    Psychosocial Psychosocial Symptoms Reported: Not assessed            12/21/2023    3:23 PM  Depression screen PHQ 2/9  Decreased Interest 0  Down, Depressed, Hopeless 0  PHQ - 2 Score 0  Altered sleeping 3  Tired, decreased energy 3  Change in appetite 3  Feeling bad or failure about yourself  0  Trouble concentrating 0  Moving slowly or fidgety/restless 0  Suicidal thoughts 0  PHQ-9 Score 9  Difficult doing work/chores Not difficult at all    There were no vitals filed for this visit.  Medications Reviewed Today     Reviewed by Lonzell Planas, RN (Registered Nurse) on 01/17/24 at 1505  Med List Status: <None>   Medication Order Taking? Sig Documenting Provider Last Dose Status Informant  azelastine  (ASTELIN ) 0.1 % nasal spray [id number]  Place 2 sprays into both nostrils 2 (two) times daily. Use in each nostril as directed Okey Burns, MD  Active   bacitracin  500 UNIT/GM ointment [id number]  Apply 1 Application topically 2 (two) times daily. Apply to affected area BID x 10 days then stop  Patient not taking: Reported on 01/03/2024   Soldatova, Liuba, MD  Active  celecoxib  (CELEBREX ) 200 MG capsule [id number]  Take 1 capsule (200 mg total) by mouth 2 (two) times daily as needed (pain). Sebastian Beverley NOVAK, MD  Active   esomeprazole   (NEXIUM ) 40 MG capsule [id number]  TAKE 1 CAPSULE BY MOUTH EVERY DAY Sebastian Beverley NOVAK, MD  Active   famotidine  (PEPCID ) 40 MG tablet [id number]  Take 1 tablet (40 mg total) by mouth at bedtime. Sebastian Beverley NOVAK, MD  Active   furosemide  (LASIX ) 40 MG tablet [id number]  Take 1 tablet (40 mg total) by mouth daily. Monetta Redell PARAS, MD  Active   ipratropium (ATROVENT ) 0.06 % nasal spray [id number]  Place 2 sprays into both nostrils 2 (two) times daily. Sebastian Beverley NOVAK, MD  Expired 12/21/23 2359   levalbuterol  (XOPENEX  HFA) 45 MCG/ACT inhaler [id number]  Inhale 1-2 puffs into the lungs every 6 (six) hours as needed for wheezing. Sebastian Beverley NOVAK, MD  Active   levocetirizine (XYZAL  ALLERGY 24HR) 5 MG tablet [id number]  Take 1 tablet (5 mg total) by mouth every evening. Soldatova, Liuba, MD  Active   mometasone  (NASONEX ) 50 MCG/ACT nasal spray [id number]  Place 2 sprays into the nose daily. Soldatova, Liuba, MD  Active            Med Note DANICE, Eurika Sandy   Thu Dec 01, 2023  3:36 PM) Patient states as per pharmacy, needs PA.  Request sent to clinical pharmacist  polyethylene glycol-electrolytes (NULYTELY ) 420 g solution [id number]  Take by mouth. [provider]  Active   pravastatin  (PRAVACHOL ) 20 MG tablet [id number]  TAKE 1 TABLET BY MOUTH EVERY DAY FOR CHOLESTEROL Sebastian Beverley NOVAK, MD  Active   promethazine -dextromethorphan (PROMETHAZINE -DM) 6.25-15 MG/5ML syrup [id number]  Take 5 mLs by mouth 4 (four) times daily as needed.  Patient not taking: Reported on 12/21/2023   Sebastian Beverley NOVAK, MD  Active   Semaglutide , 1 MG/DOSE, 4 MG/3ML SOPN [id number]  Inject 1 mg as directed once a week. Sebastian Beverley NOVAK, MD  Active   spironolactone  (ALDACTONE ) 25 MG tablet [id number]  TAKE 1 TABLET BY MOUTH EVERY DAY Sebastian Beverley NOVAK, MD  Active   Tiotropium Bromide-Olodaterol (STIOLTO RESPIMAT ) 2.5-2.5 MCG/ACT AERS [id number]  Inhale 2 puffs into the lungs daily. Hunsucker, Donnice SAUNDERS, MD  Active              Recommendation:   Continue Current Plan of Care  Follow Up Plan:   Closing From:  Complex Care Management Patient declines to continue with RpH, or RNCM.    SIG  Olam Idol BSN RN CCM [place]  [facility], [facility] RN Care Manager Direct Dial: [phone] Fax: [phone]

## 2024-01-19 ENCOUNTER — Ambulatory Visit

## 2024-01-20 ENCOUNTER — Ambulatory Visit: Admitting: Pulmonary Disease

## 2024-01-20 ENCOUNTER — Encounter: Payer: Self-pay | Admitting: Pulmonary Disease

## 2024-01-20 VITALS — BP 130/78 | HR 94 | Temp 98.6°F | Ht 64.0 in | Wt 246.0 lb

## 2024-01-20 DIAGNOSIS — R0609 Other forms of dyspnea: Secondary | ICD-10-CM | POA: Diagnosis not present

## 2024-01-20 LAB — PULMONARY FUNCTION TEST
DL/VA % pred: 130 %
DL/VA: 5.52 ml/min/mmHg/L
DLCO cor % pred: 64 %
DLCO cor: 13.14 ml/min/mmHg
DLCO unc % pred: 66 %
DLCO unc: 13.6 ml/min/mmHg
FEF 25-75 Post: 1.39 L/s
FEF 25-75 Pre: 2.06 L/s
FEF2575-%Change-Post: -32 %
FEF2575-%Pred-Post: 56 %
FEF2575-%Pred-Pre: 84 %
FEV1-%Change-Post: -4 %
FEV1-%Pred-Post: 64 %
FEV1-%Pred-Pre: 67 %
FEV1-Post: 1.69 L
FEV1-Pre: 1.77 L
FEV1FVC-%Change-Post: -1 %
FEV1FVC-%Pred-Pre: 105 %
FEV6-%Change-Post: -3 %
FEV6-%Pred-Post: 63 %
FEV6-%Pred-Pre: 65 %
FEV6-Post: 2.06 L
FEV6-Pre: 2.13 L
FEV6FVC-%Pred-Post: 103 %
FEV6FVC-%Pred-Pre: 103 %
FVC-%Change-Post: -3 %
FVC-%Pred-Post: 61 %
FVC-%Pred-Pre: 63 %
FVC-Post: 2.07 L
FVC-Pre: 2.13 L
Post FEV1/FVC ratio: 82 %
Post FEV6/FVC ratio: 100 %
Pre FEV1/FVC ratio: 83 %
Pre FEV6/FVC Ratio: 100 %
RV % pred: 63 %
RV: 1.24 L
TLC % pred: 69 %
TLC: 3.51 L

## 2024-01-20 NOTE — Patient Instructions (Signed)
 Full pft performed today.

## 2024-01-20 NOTE — Progress Notes (Signed)
 @Patient  ID: Theresa Marquez, female    DOB: Sep 07, 1965, 58 y.o.   MRN: [id number]  No chief complaint on file.   Referring provider: Sebastian Beverley NOVAK, MD  HPI:   58 y.o. woman whom are seen for evaluation of chronic cough.  With recent ENT note reviewed.  Most recent PCP note reviewed.  Returns for routine follow-up.  Try Stiolto last time to help with bronchospastic cough as well as dyspnea.  Unfortunately as soon as she was actually the Stiolto she going to coughing fits.  Tried few times.  Was not able to tolerate this.  Seen by ENT again.  Again feels like laryngeal reflux is main contributor.  Discussed at length that even with lack of symptoms can be a major issue.  She reports good adherence to her multiple antacids.  No improvement in cough.  PFTs obtained today.  There are no obvious culprits for her dyspnea.  Other than potential restriction related to habitus.  Full interpretation below.  Reviewed most recent CT scan 07/2023 with very mild emphysematous changes, no signs of ILD etc.  We discussed at length that if dyspnea is an issue from the lungs that bronchodilators would be most helpful.  She uses albuterol  in the past without improvement.  We could try long-acting bronchodilator in the future.  I am suspicious there is another etiology driving her symptoms based on relatively reassuring chest images as well as pulmonary function test and lack of improvement with inhalers.  HPI initial visit: Cough present for a long time.  Dry.  Feels like in her throat.  Like phlegm is stuck.  No phlegm ever comes.  Frequently clearing her throat E TC.  Worse at night when she lies down.  No seasonal or environmental factors she can identify it made things better or worse.  No alleviating  factors.  Sometimes cough is triggered by deep breaths or laughing.  Has tried intranasal steroids, antihistamine.  No improvement.  Saw ENT and sounds like on fiberoptic exam concern for reflux.  She denies any  heartburn symptoms.  We did discuss silent reflux.  She smokes.  She has cut back.  40+ pack year.  Average a pack a day for 40 years.  She had a chest x-ray 08/2023 that revealed clear lungs per report.  This was done via [facility].  In the ED.  She reports dyspnea on exertion.  Reports of asthma.  Has albuterol  but does not use for cough diffuse for chest tightness and not really shortness of breath.  We discussed at length the etiology of cough in general and her in particular.  How we should treat this.  Further evaluation for this and her dyspnea on exertion.  We discussed smoking.  We discussed her occupational exposure to solvents etc. in the [employer] .  We discussed further workup with pulmonary function test.  Discussed using inhalers to help both with cough if related to bronchospasm as well as to help with shortness of breath.  Questionaires / Pulmonary Flowsheets:   ACT:      No data to display          MMRC:     No data to display          Epworth:      No data to display          Tests:   FENO:  No results found for: NITRICOXIDE  PFT:    Latest Ref Rng & Units 01/20/2024  8:06 AM  PFT Results  FVC-Pre L 2.13  P  FVC-Predicted Pre % 63  P  FVC-Post L 2.07  P  FVC-Predicted Post % 61  P  Pre FEV1/FVC % % 83  P  Post FEV1/FCV % % 82  P  FEV1-Pre L 1.77  P  FEV1-Predicted Pre % 67  P  FEV1-Post L 1.69  P  DLCO uncorrected ml/min/mmHg 13.60  P  DLCO UNC% % 66  P  DLCO corrected ml/min/mmHg 13.14  P  DLCO COR %Predicted % 64  P  DLVA Predicted % 130  P  TLC L 3.51  P  TLC % Predicted % 69  P  RV % Predicted % 63  P    P Preliminary result  Personally reviewed interpreted spirometry suggestive of moderate restriction versus air trapping.  No bronchodilator response.  Mild to moderate restriction noted on lung volumes.  DLCO is moderately reduced and likely under estimated given the volumes used for calculation compared to TLC measured,  volumes used for calculations are lower than TLC by significant proportion.  WALK:      No data to display          Imaging: Personally reviewed and as per EMR and discussion in this note CT MAXILLOFACIAL W CONTRAST Result Date: 12/21/2023 CLINICAL DATA:  Initial evaluation for acute infection, abscess. EXAM: CT MAXILLOFACIAL WITH CONTRAST TECHNIQUE: Multidetector CT imaging of the maxillofacial structures was performed with intravenous contrast. Multiplanar CT image reconstructions were also generated. RADIATION DOSE REDUCTION: This exam was performed according to the departmental dose-optimization program which includes automated exposure control, adjustment of the mA and/or kV according to patient size and/or use of iterative reconstruction technique. CONTRAST:  75mL OMNIPAQUE  IOHEXOL  300 MG/ML  SOLN COMPARISON:  None Available. FINDINGS: Osseous: No acute osseous abnormality. No discrete or worrisome osseous lesions. Left-sided facet arthrosis noted at C2-3. Few small dental caries noted. Orbits: Globes and orbital soft tissues within normal limits. No evidence for intraorbital or postseptal cellulitis. Sinuses: Mild mucosal thickening present about the inferior aspect of the left maxillary sinus. Few small maxillary sinus retention cyst noted. Paranasal sinuses are otherwise clear. Mastoid air cells and middle ear cavities are well pneumatized and free of fluid. Soft tissues: Soft tissue swelling with hazy inflammatory stranding seen involving the right lower face, concerning for infection/cellulitis (series 301, images 28-14). Changes are localized about the right corner of mouth. No discrete abscess or drainable fluid collection is seen. No extension to involve the deeper spaces of the face/neck at this time. Limited intracranial: Unremarkable. IMPRESSION: Soft tissue swelling with hazy inflammatory stranding involving the right lower face, concerning for acute infection/cellulitis. No discrete  abscess or drainable fluid collection. No extension to involve the deeper spaces of the face/neck at this time. Electronically Signed   By: Morene Hoard M.D.   On: 12/21/2023 19:59    Lab Results: Personally reviewed CBC    Component Value Date/Time   WBC 17.7 (H) 12/21/2023 1522   RBC 4.42 12/21/2023 1522   HGB 14.6 12/21/2023 1522   HGB 14.8 08/31/2022 1446   HCT 43.9 12/21/2023 1522   PLT 424.0 (H) 12/21/2023 1522   PLT 350 08/31/2022 1446   MCV 99.2 12/21/2023 1522   MCH 29.5 08/31/2022 1446   MCHC 33.3 12/21/2023 1522   RDW 16.7 (H) 12/21/2023 1522   LYMPHSABS 3.8 12/21/2023 1522   MONOABS 0.8 12/21/2023 1522   EOSABS 0.3 12/21/2023 1522   BASOSABS 0.1 12/21/2023 1522  BMET    Component Value Date/Time   NA 134 (L) 12/21/2023 1522   NA 143 05/10/2022 1506   K 3.2 (L) 12/21/2023 1522   CL 97 12/21/2023 1522   CO2 29 12/21/2023 1522   GLUCOSE 131 (H) 12/21/2023 1522   BUN 7 12/21/2023 1522   BUN 6 05/10/2022 1506   CREATININE 0.82 12/21/2023 1522   CREATININE 0.87 08/31/2022 1446   CALCIUM  9.5 12/21/2023 1522   GFRNONAA >60 08/31/2022 1446    BNP    Component Value Date/Time   BNP 33.3 12/01/2021 2106    ProBNP    Component Value Date/Time   PROBNP 145 05/10/2022 1506    Specialty Problems       Pulmonary Problems   Recurrent sinus infections   Rhinitis   OSA (obstructive sleep apnea)   Wheezing   Small airways disease   Tonsillitis and adenoiditis, chronic   Simple chronic bronchitis (HCC)   Chronic cough    Allergies  Allergen Reactions   Codeine Other (See Comments)    Hallucinations  Hallucinates    Prednisone Swelling   Dietary Management Product Other (See Comments)    Breaks out in hives when drinking diet soda   Statins Other (See Comments)    Myalgias    Immunization History  Administered Date(s) Administered   Influenza,inj,Quad PF,6+ Mos 06/19/2018   Tdap 09/23/2019   Zoster Recombinant(Shingrix ) 11/30/2021     Past Medical History:  Diagnosis Date   Allergy    Chronic pain    COPD (chronic obstructive pulmonary disease) (HCC)    Coronary artery disease    Diabetes mellitus without complication (HCC)    Endometrial polyp 08/09/2017   Formatting of this note might be different from the original. S/p hysteroscopy with D&C 10/2017 with Dr Janeen, was benign   Sleep apnea     Tobacco History: Social History   Tobacco Use  Smoking Status Every Day   Current packs/day: 0.25   Average packs/day: 0.3 packs/day for 54.2 years (13.6 ttl pk-yrs)   Types: Cigarettes   Start date: 10/26/1969  Smokeless Tobacco Never  Tobacco Comments   Smoking cessation   Ready to quit: Not Answered Counseling given: Not Answered Tobacco comments: Smoking cessation   Continue to not smoke  Outpatient Encounter Medications as of 01/20/2024  Medication Sig   azelastine  (ASTELIN ) 0.1 % nasal spray Place 2 sprays into both nostrils 2 (two) times daily. Use in each nostril as directed   celecoxib  (CELEBREX ) 200 MG capsule Take 1 capsule (200 mg total) by mouth 2 (two) times daily as needed (pain).   esomeprazole  (NEXIUM ) 40 MG capsule TAKE 1 CAPSULE BY MOUTH EVERY DAY   famotidine  (PEPCID ) 40 MG tablet Take 1 tablet (40 mg total) by mouth at bedtime.   furosemide  (LASIX ) 40 MG tablet Take 1 tablet (40 mg total) by mouth daily.   ipratropium (ATROVENT ) 0.06 % nasal spray Place 2 sprays into both nostrils 2 (two) times daily.   levalbuterol  (XOPENEX  HFA) 45 MCG/ACT inhaler Inhale 1-2 puffs into the lungs every 6 (six) hours as needed for wheezing.   levocetirizine (XYZAL  ALLERGY 24HR) 5 MG tablet Take 1 tablet (5 mg total) by mouth every evening.   mometasone  (NASONEX ) 50 MCG/ACT nasal spray Place 2 sprays into the nose daily.   polyethylene glycol-electrolytes (NULYTELY ) 420 g solution Take by mouth.   pravastatin  (PRAVACHOL ) 20 MG tablet TAKE 1 TABLET BY MOUTH EVERY DAY FOR CHOLESTEROL   Semaglutide , 1 MG/DOSE,  4 MG/3ML SOPN Inject 1 mg as directed once a week.   spironolactone  (ALDACTONE ) 25 MG tablet TAKE 1 TABLET BY MOUTH EVERY DAY   Tiotropium Bromide-Olodaterol (STIOLTO RESPIMAT ) 2.5-2.5 MCG/ACT AERS Inhale 2 puffs into the lungs daily.   bacitracin  500 UNIT/GM ointment Apply 1 Application topically 2 (two) times daily. Apply to affected area BID x 10 days then stop (Patient not taking: Reported on 01/20/2024)   promethazine -dextromethorphan (PROMETHAZINE -DM) 6.25-15 MG/5ML syrup Take 5 mLs by mouth 4 (four) times daily as needed. (Patient not taking: Reported on 01/20/2024)   No facility-administered encounter medications on file as of 01/20/2024.     Review of Systems  Review of Systems  N/a Physical Exam  BP 130/78   Pulse 94   Temp 98.6 F (37 C) (Temporal)   Ht 5' 4 (1.626 m)   Wt 246 lb (111.6 kg)   LMP  (LMP Unknown)   SpO2 99%   BMI 42.23 kg/m   Wt Readings from Last 5 Encounters:  01/20/24 246 lb (111.6 kg)  12/21/23 243 lb 3.2 oz (110.3 kg)  12/06/23 250 lb (113.4 kg)  11/23/23 252 lb 6.4 oz (114.5 kg)  11/14/23 253 lb (114.8 kg)    BMI Readings from Last 5 Encounters:  01/20/24 42.23 kg/m  12/21/23 41.75 kg/m  12/06/23 42.91 kg/m  11/23/23 43.32 kg/m  11/14/23 43.43 kg/m     Physical Exam General: Sitting in chair, no acute stress Eyes: EOMI, no icterus Neck: Supple, no JVP appreciated Pulmonary: Distant, clear Cardiovascular: Warm, regular rate and rhythm Abdomen: Nondistended MSK: No synovitis, no joint effusion Neuro: Normal gait, no weakness Psych: Normal mood, full affect   Assessment & Plan:   Chronic cough: Likely related to silent reflux as well as postnasal drip, sinus issues.  Some suspicion for bronchospasm given cough with deep breaths laughing etc. chest x-ray report clear 08/2023.  Cough worse with Stiolto.  No improvement with bronchodilators short acting, albuterol  in the past.  Most likely later pharyngeal reflux, silent reflux.   Difficult to treat.  As well as component of postnasal drip.  She is not on all her nasal sprays given awaiting prior Auth from ENT office.  Encouraged ongoing ENT follow-up.  Dyspnea on exertion: Suspect multifactorial related deconditioning with multiple joint issues, habitus/weight, chronic pain, some concern for smoking-related lung disease, occupational exposure to solvents etc. CT scan 07/2023 with mild mosaicism, mild emphysema, no evidence of ILD, clear lungs.  PFTs 12/2023 with mild to moderate restriction likely related to habitus.  No improvement with bronchodilators.  Unlikely to be significant pulmonary contributor.  Consider prescribing long-acting bronchodilators, specifically Bevespi in the future given she is failed Anoro and Stiolto.  Tobacco abuse: Discussed lung cancer screening in the past.  She is precontemplative.  Return if symptoms worsen or fail to improve.   Theresa JONELLE Beals, MD 01/20/2024  I spent 41 minutes in the care of the patient today face-to-face visit, review of records coordination of care.

## 2024-01-20 NOTE — Progress Notes (Signed)
 Full pft performed today.
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
## 2024-02-01 DIAGNOSIS — J449 Chronic obstructive pulmonary disease, unspecified: Secondary | ICD-10-CM | POA: Insufficient documentation

## 2024-02-01 DIAGNOSIS — E119 Type 2 diabetes mellitus without complications: Secondary | ICD-10-CM | POA: Insufficient documentation

## 2024-02-01 DIAGNOSIS — G8929 Other chronic pain: Secondary | ICD-10-CM | POA: Insufficient documentation

## 2024-02-01 DIAGNOSIS — I2510 Atherosclerotic heart disease of native coronary artery without angina pectoris: Secondary | ICD-10-CM | POA: Insufficient documentation

## 2024-02-01 NOTE — Progress Notes (Signed)
 Called patient to complete pre op phone call for upcoming colonoscopy. Patient stated that she will most likely have to reschedule. She needs to see a neurologist for clearance and she has not been in contact with their office yet.  COVID Vaccine Completed:  Date of COVID positive in last 90 days:  PCP - Beverley Hummer, MD Cardiologist - Redell Leiter, MD LOV 06/07/23 Epic Pulmonary- Dr. Annella LOV 01/20/24  Chest x-ray - CT 07/29/23 Epic EKG - 05/29/23 Epic Stress Test -  ECHO - 12/02/21 Epic Cardiac Cath -  Pacemaker/ICD device last checked: Spinal Cord Stimulator:  Bowel Prep -   Sleep Study -  CPAP -   Fasting Blood Sugar -  Checks Blood Sugar _____ times a day  Last dose of GLP1 agonist-  Semaglutide  GLP1 instructions:  Do not take after  02/05/24   Last dose of SGLT-2 inhibitors-  N/A SGLT-2 instructions:  Do not take after     Blood Thinner Instructions:  Last dose:   Time: Aspirin Instructions: Last Dose:  Activity level:  Can go up a flight of stairs and perform activities of daily living without stopping and without symptoms of chest pain or shortness of breath.  Able to exercise without symptoms  Unable to go up a flight of stairs without symptoms of     Anesthesia review: CAD, HTN, aortic atherosclerosis, OSA, DM2, COPD  Patient denies shortness of breath, fever, cough and chest pain at PAT appointment  Patient verbalized understanding of instructions that were given to them at the PAT appointment. Patient was also instructed that they will need to review over the PAT instructions again at home before surgery.

## 2024-02-02 ENCOUNTER — Ambulatory Visit: Admitting: Cardiology

## 2024-02-02 DIAGNOSIS — G4733 Obstructive sleep apnea (adult) (pediatric): Secondary | ICD-10-CM

## 2024-02-02 DIAGNOSIS — R55 Syncope and collapse: Secondary | ICD-10-CM

## 2024-02-02 DIAGNOSIS — J410 Simple chronic bronchitis: Secondary | ICD-10-CM

## 2024-02-03 ENCOUNTER — Other Ambulatory Visit: Payer: Self-pay

## 2024-02-03 DIAGNOSIS — R55 Syncope and collapse: Secondary | ICD-10-CM

## 2024-02-06 ENCOUNTER — Telehealth: Payer: Self-pay | Admitting: Gastroenterology

## 2024-02-06 ENCOUNTER — Telehealth: Payer: Self-pay | Admitting: Neurology

## 2024-02-06 ENCOUNTER — Ambulatory Visit (INDEPENDENT_AMBULATORY_CARE_PROVIDER_SITE_OTHER): Admitting: Otolaryngology

## 2024-02-06 NOTE — Telephone Encounter (Signed)
 Pt called in stating she received a call to schedule an EMG. She does not want to do the test.

## 2024-02-06 NOTE — Telephone Encounter (Signed)
 Dr Wilhelmenia this pt has not heard from neuro in regards to follow up prior to her endo colon on Monday.  [facility] is asking about clearance.  I only see the initials of the docs so I can not check on it. Not sure who to contact- have you heard anything?

## 2024-02-06 NOTE — Telephone Encounter (Signed)
 The pt has been advised that her case will need to be postponed until all her workup is complete and she is cleared to proceed.  Appt cancelled for now and 3 month recall placed.

## 2024-02-06 NOTE — Telephone Encounter (Signed)
 See alternate (duplicate note )

## 2024-02-06 NOTE — Telephone Encounter (Addendum)
 Procedure:Colonoscopy/Endoscopy Procedure date: 02/13/24 Procedure location: [facility] Arrival Time: 9:15 am Spoke with the patient Y/N:   I left a detailed message on [phone] on 02/06/24 @ 10:09 am for the patient to return call   Procedures have been cancelled.  Any prep concerns? ___  Has the patient obtained the prep from the pharmacy ? ___ Do you have a care partner and transportation: ___ Any additional concerns? ___

## 2024-02-06 NOTE — Telephone Encounter (Signed)
 Thanks for the follow-up.  Our office has been trying to contact patient to schedule EEG and follow-up.  Will keep you posted once completed.

## 2024-02-06 NOTE — Progress Notes (Deleted)
 ENT CONSULT:  Reason for Consult: chronic tonsillitis    HPI: Discussed the use of AI scribe software for clinical note transcription with the patient, who gave verbal consent to proceed.  History of Present Illness Theresa Marquez is a 58 year old female current smoker, hx of sleep apnea who presents with chronic cough and throat irritation.  She has experienced a chronic dry and violent cough for approximately two years, leading to episodes of syncope. There is a persistent sensation of a foreign body in her throat, which she attempts to expel without success. The cough has recently worsened, prompting her to temporarily cease smoking. Previous treatment with Augmentin for ten days was ineffective. She also experiences feverish and myalgic sensations, and the cough has resulted in muscle tears in her ribs and back.  She has a history of chronic tonsillitis, with a CT neck scan in January revealing inflamed tonsils and adenoids. She attributes her cough to throat issues rather than pulmonary issue. A previous diagnosis of COPD was made by a pulmonary doctor, although she has not had recent pulmonology follow-up/workup.  Her sleep apnea remains untreated due to repeated cancellations of CPAP setup appointments. She experiences sleep disturbances, particularly when symptoms of cough are severe.  She has a long history of smoking but has significantly reduced her smoking recently due to the worsening of her cough. She is currently taking Nexium  for reflux. She uses a NeilMed bottle for nasal irrigation and has been prescribed allergy medication in the past, though she is not currently taking any.   Records Reviewed:  Visit at [facility] 09/24/23  58 y/o female comes in c/o flu like symptoms for the past 2 weeks. Pt has been taking mucinex without any relief.   Take the Augmentin 1 pill twice a day for the next 10 days. You may use Tylenol  and ibuprofen  as needed for pain relief  and fever  reduction. Use a saline nasal spray to help with nasal congestion. Use your albuterol  inhaler as needed for cough, shortness of breath, and wheezing.    Past Medical History:  Diagnosis Date   Abdominal bloating 07/15/2023   AKI (acute kidney injury) (HCC) 12/14/2021   Allergy    Aortic atherosclerosis (HCC) 08/15/2023   Bilateral lower extremity edema 11/10/2021   Breast asymmetry in left breast on screeing mammogram 08/18/2023   Cervicalgia 08/24/2022   Chronic bilateral low back pain with bilateral sciatica 02/25/2022   Chronic cough 10/13/2023   Chronic pain    Class 3 severe obesity due to excess calories with serious comorbidity and body mass index (BMI) of 45.0 to 49.9 in adult 07/15/2023   Closed fracture of tooth 03/01/2023   Colon, diverticulosis 08/15/2023   COPD (chronic obstructive pulmonary disease) (HCC)    Coronary artery disease    Coronary artery disease involving native coronary artery of native heart without angina pectoris 08/15/2023   Depression, major, single episode, moderate (HCC) 07/15/2023   Diabetes mellitus without complication (HCC)    Drug side effects 11/14/2023   Family history of thyroid  disease 10/05/2021   Fatigue 12/24/2014   Frequent falls 03/01/2023   Gastroesophageal reflux disease 11/14/2023   Hepatomegaly 08/15/2023   Hoarseness 10/13/2023   Lumbar foraminal stenosis 03/01/2023   Lymphadenopathy 07/15/2023   Mass of upper outer quadrant of left breast 09/09/2023   Metabolic dysfunction-associated steatotic liver disease (MASLD) 08/15/2023   Myalgia due to statin 07/14/2023   Nausea 10/13/2023   Neck swelling 10/05/2021   Neuralgia 12/10/2022  Obesity, morbid, BMI 40.0-49.9 (HCC) 11/10/2021   OSA (obstructive sleep apnea) 12/14/2021   Pain in thoracic spine 06/07/2023   Paresthesia 06/07/2023   Positive colorectal cancer screening using Cologuard test 07/15/2023   Pruritic dermatitis 08/24/2022   Pulmonary artery hypertension  (HCC) 08/15/2023   Rash of both hands 12/10/2022   Recurrent sinus infections 11/10/2021   Rhinitis 11/30/2021   Sedimentation rate elevation 03/24/2023   Seizure-like activity (HCC) 11/14/2022   Simple chronic bronchitis (HCC) 08/16/2023   Sleep disturbance 08/24/2022   Small airways disease 08/15/2023   Small vessel disease (HCC) 03/01/2023   Syncope 03/01/2023   Tobacco abuse 11/10/2021   Tonsillitis and adenoiditis, chronic 08/15/2023   Type 2 diabetes mellitus without complication, with long-term current use of insulin (HCC) 08/15/2023   Umbilical hernia without obstruction and without gangrene 08/15/2023   Vasomotor symptoms due to menopause 11/14/2023   Visual disturbances 11/14/2023   Weakness of extremity 11/14/2022   Wheezing 12/14/2021    Past Surgical History:  Procedure Laterality Date   CHOLECYSTECTOMY     DG GALL BLADDER Right     Family History  Problem Relation Age of Onset   Kidney disease Mother    Rectal cancer Father    Colon cancer Father    Cancer Father    Colon cancer Maternal Grandmother    Cerebral palsy Daughter    Breast cancer Neg Hx    Crohn's disease Neg Hx    Colon polyps Neg Hx    Esophageal cancer Neg Hx    Stomach cancer Neg Hx     Social History:  reports that she has been smoking cigarettes. She started smoking about 54 years ago. She has a 13.6 pack-year smoking history. She has never used smokeless tobacco. She reports that she does not currently use alcohol. She reports that she does not use drugs.  Allergies:  Allergies  Allergen Reactions   Codeine Other (See Comments)    Hallucinations  Hallucinates    Prednisone Swelling   Dietary Management Product Other (See Comments)    Breaks out in hives when drinking diet soda   Statins Other (See Comments)    Myalgias    Medications: I have reviewed the patient's current medications.  The PMH, PSH, Medications, Allergies, and SH were reviewed and  updated.  ROS: Constitutional: Negative for fever, weight loss and weight gain. Cardiovascular: Negative for chest pain and dyspnea on exertion. Respiratory: Is not experiencing shortness of breath at rest. Gastrointestinal: Negative for nausea and vomiting. Neurological: Negative for headaches. Psychiatric: The patient is not nervous/anxious  There were no vitals taken for this visit. There is no height or weight on file to calculate BMI.  PHYSICAL EXAM:  Exam: General: Well-developed, well-nourished Communication and Voice: raspy Respiratory Respiratory effort: Equal inspiration and expiration without stridor Cardiovascular Peripheral Vascular: Warm extremities with equal color/perfusion Eyes: No nystagmus with equal extraocular motion bilaterally Neuro/Psych/Balance: Patient oriented to person, place, and time; Appropriate mood and affect; Gait is intact with no imbalance; Cranial nerves I-XII are intact Head and Face Inspection: Normocephalic and atraumatic without mass or lesion Palpation: Facial skeleton intact without bony stepoffs Salivary Glands: No mass or tenderness Facial Strength: Facial motility symmetric and full bilaterally ENT Pinna: External ear intact and fully developed External canal: Canal is patent with intact skin Tympanic Membrane: Clear and mobile External Nose: No scar or anatomic deformity Internal Nose: Septum is deviated to the right. No polyp, or purulence. Mucosal edema and erythema present.  Bilateral inferior turbinate hypertrophy.  Lips, Teeth, and gums: Mucosa and teeth intact and viable TMJ: No pain to palpation with full mobility Oral cavity/oropharynx: No erythema or exudate, no lesions present tonsils 2+ b/l Nasopharynx: No mass or lesion with intact mucosa Adenoid hypertrophy Hypopharynx: Intact mucosa without pooling of secretions Larynx Glottic: Full true vocal cord mobility without lesion or mass mild VF edema, supraglottic  compression with exam difficult to fully evaluate surface of VF Supraglottic: Normal appearing epiglottis and AE folds Interarytenoid Space: severe pachydermia&edema Subglottic Space: Patent without lesion or edema Neck Neck and Trachea: Midline trachea without mass or lesion Thyroid : No mass or nodularity Lymphatics: No lymphadenopathy  Procedure: Preoperative diagnosis: sore throat and chronic cough hx of smoking   Postoperative diagnosis:   Same + GERD LPR + PND  Procedure: Flexible fiberoptic laryngoscopy  Surgeon: Elena Larry, MD  Anesthesia: Topical lidocaine  and Afrin Complications: None Condition is stable throughout exam  Indications and consent:  The patient presents to the clinic with above symptoms. Indirect laryngoscopy view was incomplete. Thus it was recommended that they undergo a flexible fiberoptic laryngoscopy. All of the risks, benefits, and potential complications were reviewed with the patient preoperatively and verbal informed consent was obtained.  Procedure: The patient was seated upright in the clinic. Topical lidocaine  and Afrin were applied to the nasal cavity. After adequate anesthesia had occurred, I then proceeded to pass the flexible telescope into the nasal cavity. The nasal cavity was patent without rhinorrhea or polyp. The nasopharynx was also patent without mass or lesion. The base of tongue was visualized and was normal. There were no signs of pooling of secretions in the piriform sinuses. The true vocal folds were mobile bilaterally. There were no signs of glottic or supraglottic mucosal lesion or mass. There was severe interarytenoid pachydermia and post cricoid edema. The telescope was then slowly withdrawn and the patient tolerated the procedure throughout.    PROCEDURE NOTE: nasal endoscopy  Preoperative diagnosis: chronic nasal congestion symptoms  Postoperative diagnosis: same  Procedure: Diagnostic nasal endoscopy (68768)  Surgeon:  Elena Larry, M.D.  Anesthesia: Topical lidocaine  and Afrin  H&P REVIEW: The patient's history and physical were reviewed today prior to procedure. All medications were reviewed and updated as well. Complications: None Condition is stable throughout exam Indications and consent: The patient presents with symptoms of chronic sinusitis not responding to previous therapies. All the risks, benefits, and potential complications were reviewed with the patient preoperatively and informed consent was obtained. The time out was completed with confirmation of the correct procedure.   Procedure: The patient was seated upright in the clinic. Topical lidocaine  and Afrin were applied to the nasal cavity. After adequate anesthesia had occurred, the rigid nasal endoscope was passed into the nasal cavity. The nasal mucosa, turbinates, septum, and sinus drainage pathways were visualized bilaterally. This revealed no purulence or significant secretions that might be cultured. There were no polyps or sites of significant inflammation. The mucosa was intact and there was no crusting present. The scope was then slowly withdrawn and the patient tolerated the procedure well. There were no complications or blood loss.   Studies Reviewed: CT soft tissue neck 07/29/23 FINDINGS: Pharynx and larynx: The glottis is closed now. Generalized pharyngeal and tonsillar hypertrophy again noted but does not appear significantly changed since the 2019 CT. There are chronic postinflammatory dystrophic calcifications of the palatine tonsils. No inflammation in the parapharyngeal or retropharyngeal spaces. Incidental chronic retropharyngeal course of both carotid arteries, normal variant.  No laryngeal or pharyngeal mass or hyperenhancement identified.   Salivary glands: Negative.  Negative sublingual space.   Thyroid : Negative.   Lymph nodes: Bilateral cervical lymph nodes are subcentimeter, and are generally regressed from  the 2019 CT. No enlarged or heterogeneous lymph nodes. No cervical lymphadenopathy.   Vascular: Retropharyngeal course of both carotid arteries, normal variant. The major vascular structures in the neck and at the skull base are enhancing and appear to be patent. Mild for age cervical carotid atherosclerosis.   Limited intracranial: Negative.   Visualized orbits: Negative.   Mastoids and visualized paranasal sinuses: Visualized paranasal sinuses and mastoids are stable and well aerated.   Skeleton: Occasional dental caries. Ordinary cervical spine degeneration. No acute or suspicious osseous abnormality identified.   Upper chest: Reported separately.  Calcified aortic atherosclerosis.   IMPRESSION: 1. No acute or inflammatory process identified in the Neck. No mass or lymphadenopathy. Chronic generalized pharyngeal tonsillar/adenoid hypertrophy is not significantly changed from 2019.  CT chest 07/29/23 IMPRESSION: 1. Mild heterogeneous pulmonary parenchyma, can be seen with small airways disease. 2. Dilated main pulmonary artery, suggestive of pulmonary arterial hypertension. 3. Coronary artery calcifications. Aortic Atherosclerosis  Assessment/Plan: Encounter Diagnoses  Name Primary?   Tobacco abuse Yes   OSA (obstructive sleep apnea)    Environmental and seasonal allergies    Globus sensation    Chronic GERD    Post-nasal drip    Tonsillar hypertrophy    Chronic sore throat    Adenoid hypertrophy      Assessment and Plan Assessment & Plan Chronic Cough Chronic cough due to postnasal drainage and reflux-induced laryngeal inflammation vs pulmonary vs (hx of smoking). Smoking and reflux are significant contributors based on exam, she had significant nasal congestion which could be 2/2 smoking vs environmental allergies, and her flexible scope exam showed severe post-cricoid edema/pachydermia c/w GERD LOR - Prescribe Xyzal  for allergies. - Prescribe Nasonex  for nasal  inflammation. - Continue Nexium  30 minutes before breakfast. - Prescribe Pepcid  for nighttime use. - Recommend Reflux Gourmet postprandially, especially after dinner. - Advise gargling with saline. - Advise nasal irrigation with saline using NeilMed bottle.  Chronic Tonsillitis Tonsillar and Adenoid Hypertrophy Chronic tonsillitis with inflamed tonsils and adenoids. Tonsillectomy considered if other treatments fail. - medical management of GERD and post-nasal drainage as above - gargle with salt water - Consider tonsillectomy if other treatments fail.  Obstructive Sleep Apnea Obstructive sleep apnea untreated due to appointment cancellations. CPAP therapy necessary despite potential side effects 2/2 diagnosis of OSA on sleep study - Refer to [facility] for CPAP management.  Pulmonary Arterial Hypertension and Small Airway Disease on CT chest  Pulmonary arterial hypertension and small airway disease. Referral to pulmonology necessary for comprehensive management. - Refer to pulmonology for further evaluation.  Smoking Cessation Long history of smoking contributing to postnasal drainage and throat irritation. Smoking cessation crucial for symptom improvement. Spent 3 min counseling. - Provided information on [place] [facility] for smoking cessation.  Superficial Skin Infection Superficial skin infection around nose and eyes, possibly related to vitamin deficiency. - Prescribe antibiotic ointment for skin infection. This could either be superficial skin infection vs cheilosis   Follow-up Follow-up planned to assess response to treatments and referrals. - Schedule follow-up appointment in four months.      Thank you for allowing me to participate in the care of this patient. Please do not hesitate to contact me with any questions or concerns.   Elena Larry, MD Otolaryngology [facility]  Phone: [phone] Fax: [phone]    02/06/2024, 12:10 PM

## 2024-02-06 NOTE — Telephone Encounter (Signed)
 Theresa Marquez HERO [facility]   02/06/24  3:59 PM Note Patient called and stated that she received a call from us  stating that we would need her to follow up with a heart doctor and a neurologist before she can even have this procedure done. Patient stated that she is not able to see the heart doctor until August the 19 th and the neurologist for sometime in November. Patient thought this procedure was postpone. Patient is requesting a call back. Please advise.

## 2024-02-06 NOTE — Telephone Encounter (Signed)
 Patient called and stated that she received a call from us  stating that we would need her to follow up with a heart doctor and a neurologist before she can even have this procedure done. Patient stated that she is not able to see the heart doctor until August the 19 th and the neurologist for sometime in November. Patient thought this procedure was postpone. Patient is requesting a call back. Please advise.

## 2024-02-06 NOTE — Telephone Encounter (Signed)
 Hey everyone, I sent a message back in June (12/06/2023) about this patient who came in for EGD/colonoscopy but as a result of recent issues of syncope and weakness, she was not a candidate for us  to do a procedure in our endoscopy center. She is scheduled for us  to actually do her procedure next week. This is scheduled in the hospital-based setting. Wondering if you all had any updates in regards to how she is doing or optimization, since she was having new symptoms. Looks like she may not have followed up with you all? For now we plan to keep her for her hospital-based procedure next week, unless you all see any issues with which we need to cancel. Thanks. GM

## 2024-02-07 ENCOUNTER — Telehealth: Payer: Self-pay | Admitting: Neurology

## 2024-02-07 NOTE — Telephone Encounter (Signed)
 I called patient on  02-06-24 and 02-07-24 to sch a EEG and then a follow up with Dr Tobie and had to leave a VM .

## 2024-02-07 NOTE — Telephone Encounter (Signed)
 Routine EEG.  Follow-up with me 1-2 weeks after testing to review results.  If she does not want to proceed with EEG, then ok to schedule follow-up.

## 2024-02-10 ENCOUNTER — Telehealth: Payer: Self-pay | Admitting: *Deleted

## 2024-02-10 NOTE — Telephone Encounter (Signed)
   Name: Theresa Marquez  DOB: 03/03/1966  MRN: [id number]  Primary Cardiologist: None   Preoperative team, please contact this patient and set up a phone call appointment for further preoperative risk assessment. Please obtain consent and complete medication review. Thank you for your help.  I confirm that guidance regarding antiplatelet and oral anticoagulation therapy has been completed and, if necessary, noted below.  Patient is not on anticoagulation or antiplatelet per review of medical record in Epic.    I also confirmed the patient resides in the state of [place] . As per [facility] Medical Board telemedicine laws, the patient must reside in the state in which the provider is licensed.   Lamarr Satterfield, NP 02/10/2024, 11:57 AM [facility]

## 2024-02-10 NOTE — Telephone Encounter (Signed)
   Pre-operative Risk Assessment    Patient Name: Theresa Marquez  DOB: 06/18/1966 MRN: [id number]      Request for Surgical Clearance    Procedure:  Colonoscopy  Date of Surgery:  Clearance TBD                                 Surgeon:  Aloha Finner, MD Surgeon's Group or Practice Name:  [facility] Phone number:  [phone] Fax number:  [phone]   Type of Clearance Requested:   - Medical    Type of Anesthesia:  General (Propofol)   Additional requests/questions:    Bonney Arloa Donovan Levorn   02/10/2024, 11:30 AM

## 2024-02-10 NOTE — Telephone Encounter (Signed)
 Pt has appt 02/14/24 with Delon Hoover, NP. I will update the preop APP of the in office appt.

## 2024-02-11 DIAGNOSIS — L03113 Cellulitis of right upper limb: Secondary | ICD-10-CM | POA: Diagnosis not present

## 2024-02-13 ENCOUNTER — Other Ambulatory Visit: Payer: Self-pay

## 2024-02-13 ENCOUNTER — Encounter (HOSPITAL_COMMUNITY): Admission: RE | Payer: Self-pay | Source: Home / Self Care

## 2024-02-13 ENCOUNTER — Ambulatory Visit (HOSPITAL_COMMUNITY): Admission: RE | Admit: 2024-02-13 | Source: Home / Self Care | Admitting: Gastroenterology

## 2024-02-13 DIAGNOSIS — R6510 Systemic inflammatory response syndrome (SIRS) of non-infectious origin without acute organ dysfunction: Secondary | ICD-10-CM | POA: Diagnosis not present

## 2024-02-13 DIAGNOSIS — G4489 Other headache syndrome: Secondary | ICD-10-CM | POA: Diagnosis not present

## 2024-02-13 DIAGNOSIS — M79603 Pain in arm, unspecified: Secondary | ICD-10-CM | POA: Diagnosis not present

## 2024-02-13 DIAGNOSIS — R0789 Other chest pain: Secondary | ICD-10-CM | POA: Diagnosis not present

## 2024-02-13 DIAGNOSIS — R079 Chest pain, unspecified: Secondary | ICD-10-CM | POA: Diagnosis not present

## 2024-02-13 DIAGNOSIS — B349 Viral infection, unspecified: Secondary | ICD-10-CM | POA: Diagnosis not present

## 2024-02-13 DIAGNOSIS — E876 Hypokalemia: Secondary | ICD-10-CM | POA: Insufficient documentation

## 2024-02-13 DIAGNOSIS — B029 Zoster without complications: Secondary | ICD-10-CM | POA: Insufficient documentation

## 2024-02-13 DIAGNOSIS — R000 Tachycardia, unspecified: Secondary | ICD-10-CM | POA: Diagnosis not present

## 2024-02-13 DIAGNOSIS — M549 Dorsalgia, unspecified: Secondary | ICD-10-CM | POA: Diagnosis not present

## 2024-02-13 SURGERY — COLONOSCOPY
Anesthesia: Monitor Anesthesia Care

## 2024-02-13 NOTE — Progress Notes (Deleted)
  Cardiology Office Note   Date:  02/14/2024  ID:  SHAHED YEOMAN, DOB 1965-08-15, MRN [id number] PCP: Sebastian Beverley NOVAK, MD  [facility] Providers Cardiologist:  None { Click to update primary MD,subspecialty MD or APP then REFRESH:1}    History of Present Illness Theresa Marquez is a 58 y.o. female with a past medical history of coronary artery calcifications noted on CT imaging,  08/13/23 CT chest coronary artery calcifications, dilated main pulmonary artery 12/02/21 echo EF 55-60%, grade I DD  Theresa Marquez is a 58 y.o. female with a history of coronary and aortic atherosclerosis on CT scan chronic cough and chronic bronchitis who is being seen today for the evaluation of loss of consciousness/syncope while in the pool not requiring bystander CPR at the request of Sebastian Beverley NOVAK, MD. she was seen by me December 2024 with lower extremity edema in the setting of gabapentin  therapy obstructive sleep apnea morbid obesity and muscle weakness.  She had an echocardiogram in 2023 showing structurally normal right and left heart.  She was seen by me 06/07/2019 for for lower extremity edema in the setting of gabapentin  therapy obesity and obstructive sleep apnea.  Chest CTA January 2025 showed coronary artery calcification mild enlargement of main pulmonary artery and mild aortic atherosclerosis.   Coronary CTA, echo, live monitor    Colonoscopy ROS: ***  Studies Reviewed      *** Risk Assessment/Calculations {Does this patient have ATRIAL FIBRILLATION?:[id number]} No BP recorded.  {Refresh Note OR Click here to enter BP  :1}***       Physical Exam VS:  LMP  (LMP Unknown)        Wt Readings from Last 3 Encounters:  01/20/24 246 lb (111.6 kg)  12/21/23 243 lb 3.2 oz (110.3 kg)  12/06/23 250 lb (113.4 kg)    GEN: Well nourished, well developed in no acute distress NECK: No JVD; No carotid bruits CARDIAC: ***RRR, no murmurs, rubs, gallops RESPIRATORY:  Clear to  auscultation without rales, wheezing or rhonchi  ABDOMEN: Soft, non-tender, non-distended EXTREMITIES:  No edema; No deformity   ASSESSMENT AND PLAN ***    {Are you ordering a CV Procedure (e.g. stress test, cath, DCCV, TEE, etc)?   Press F2        :[id number]}  Dispo: ***  Signed, Delon JAYSON Hoover, NP

## 2024-02-14 ENCOUNTER — Ambulatory Visit: Admitting: Family Medicine

## 2024-02-14 ENCOUNTER — Ambulatory Visit: Admitting: Cardiology

## 2024-02-15 ENCOUNTER — Ambulatory Visit (INDEPENDENT_AMBULATORY_CARE_PROVIDER_SITE_OTHER): Admitting: Family Medicine

## 2024-02-15 ENCOUNTER — Encounter: Payer: Self-pay | Admitting: Family Medicine

## 2024-02-15 VITALS — BP 127/81 | HR 97 | Temp 97.2°F | Resp 18 | Wt 243.2 lb

## 2024-02-15 DIAGNOSIS — Z6841 Body Mass Index (BMI) 40.0 and over, adult: Secondary | ICD-10-CM | POA: Diagnosis not present

## 2024-02-15 DIAGNOSIS — Z794 Long term (current) use of insulin: Secondary | ICD-10-CM

## 2024-02-15 DIAGNOSIS — E119 Type 2 diabetes mellitus without complications: Secondary | ICD-10-CM

## 2024-02-15 DIAGNOSIS — L03113 Cellulitis of right upper limb: Secondary | ICD-10-CM

## 2024-02-15 DIAGNOSIS — E66813 Obesity, class 3: Secondary | ICD-10-CM | POA: Diagnosis not present

## 2024-02-15 DIAGNOSIS — B029 Zoster without complications: Secondary | ICD-10-CM

## 2024-02-15 MED ORDER — INDOMETHACIN 50 MG PO CAPS: 50.0000 mg | ORAL_CAPSULE | Freq: Four times a day (QID) | ORAL | 0 refills | Status: DC | PRN

## 2024-02-15 MED ORDER — SEMAGLUTIDE (2 MG/DOSE) 8 MG/3ML ~~LOC~~ SOPN: 2.0000 mg | PEN_INJECTOR | SUBCUTANEOUS | 3 refills | Status: AC

## 2024-02-15 MED ORDER — NYSTATIN 100000 UNIT/ML MT SUSP: 5.0000 mL | Freq: Three times a day (TID) | OROMUCOSAL | 0 refills | Status: DC | PRN

## 2024-02-15 NOTE — Patient Instructions (Signed)
  VISIT SUMMARY: You came in today for a follow-up on your diabetes management and recent shingles and cellulitis. Your diabetes is well controlled with a recent A1c of 5.7. You were diagnosed with shingles and cellulitis of the right upper arm four days ago and have been experiencing pain, fever, and a severe sore throat. You are currently taking acyclovir and doxycycline  for these conditions.  YOUR PLAN: -TYPE 2 DIABETES MELLITUS: Your diabetes is well controlled with a recent A1c of 5.7%. No changes to your current diabetes management plan are needed at this time.  -MORBID OBESITY DUE TO EXCESS CALORIES: Morbid obesity means having a very high body weight. Your current BMI is 41, and you are taking semaglutide  (Ozempic ) 1 mg weekly. We discussed increasing your dose to 2 mg weekly to help with weight management, but this will be done after you recover from your current illness.  -SHINGLES (HERPES ZOSTER) AND CELLULITIS OF RIGHT UPPER ARM: Shingles is a viral infection causing a painful rash, and cellulitis is a bacterial skin infection. You have been experiencing pain, fever, and a severe sore throat. Continue taking acyclovir and doxycycline . For pain and fever, you are prescribed indomethacin  50 mg every 6 hours as needed. For throat pain relief , you are prescribed Magic Mouthwash with lidocaine , diphenhydramine, aluminum magnesium  hydroxide, and nystatin .  INSTRUCTIONS: Increase semaglutide  (Ozempic ) to 2 mg weekly after you recover from your current illness. Continue taking acyclovir and doxycycline  as prescribed. Use indomethacin  75 mg twice a day as needed for pain and fever. Use Magic Mouthwash as prescribed for throat pain relief .                      Contains text generated by Abridge.                                 Contains text generated by Abridge.

## 2024-02-15 NOTE — Progress Notes (Signed)
 Assessment & Plan   Assessment/Plan:     Assessment & Plan Type 2 diabetes mellitus Type 2 diabetes mellitus is well controlled with a recent A1c of 5.7%.  Morbid obesity due to excess calories Morbid obesity with a BMI of 41. Currently on semaglutide  (Ozempic ) 1 mg weekly without significant weight loss. Discussed increasing the dose to 2 mg weekly to aid in weight management. - Increase semaglutide  (Ozempic ) to 2 mg weekly after recovery from current illness.  Shingles (herpes zoster) and cellulitis of right upper arm Recently diagnosed with shingles and cellulitis of the right upper arm. Symptoms include a painful rash, fever, sore throat, and difficulty swallowing. Currently on acyclovir and doxycycline . Differential diagnosis included internal shingles versus cellulitis. Pain and fever persist, indicating the need for additional pain management and symptomatic relief. - Continue acyclovir and doxycycline . - Prescribe indomethacin  50 mg qid as needed for pain and fever. - Prescribe Magic Mouthwash with lidocaine , diphenhydramine, aluminum magnesium  hydroxide, and nystatin  for throat pain relief .      Medications Discontinued During This Encounter  Medication Reason   Semaglutide ,0.25 or 0.5MG /DOS, (OZEMPIC , 0.25 OR 0.5 MG/DOSE,) 2 MG/3ML SOPN     Return if symptoms worsen or fail to improve, for shingles, sore throat.        Subjective:   Encounter date: 02/15/2024  Theresa Marquez is a 58 y.o. female who has Family history of thyroid  disease; Fatigue; Neck swelling; Obesity, morbid, BMI 40.0-49.9 (HCC); Tobacco abuse; Bilateral lower extremity edema; Recurrent sinus infections; Rhinitis; OSA (obstructive sleep apnea); AKI (acute kidney injury) (HCC); Wheezing; Chronic bilateral low back pain with bilateral sciatica; Allergy; Cervicalgia; Sleep disturbance; Pruritic dermatitis; Weakness of extremity; Seizure-like activity (HCC); Neuralgia; Rash of both hands; Syncope;  Frequent falls; Lumbar foraminal stenosis; Small vessel disease (HCC); Closed fracture of tooth; Sedimentation rate elevation; Pain in thoracic spine; Paresthesia; Myalgia due to statin; Lymphadenopathy; Positive colorectal cancer screening using Cologuard test; Class 3 severe obesity due to excess calories with serious comorbidity and body mass index (BMI) of 45.0 to 49.9 in adult; Abdominal bloating; Depression, major, single episode, moderate (HCC); Small airways disease; Colon, diverticulosis; Pulmonary artery hypertension (HCC); Coronary artery disease involving native coronary artery of native heart without angina pectoris; Aortic atherosclerosis (HCC); Umbilical hernia without obstruction and without gangrene; Tonsillitis and adenoiditis, chronic; Hepatomegaly; Metabolic dysfunction-associated steatotic liver disease (MASLD); Type 2 diabetes mellitus without complication, with long-term current use of insulin (HCC); Simple chronic bronchitis (HCC); Breast asymmetry in left breast on screeing mammogram; Mass of upper outer quadrant of left breast; Chronic cough; Nausea; Hoarseness; Vasomotor symptoms due to menopause; Gastroesophageal reflux disease; Visual disturbances; Drug side effects; Chronic pain; COPD (chronic obstructive pulmonary disease) (HCC); Coronary artery disease; Diabetes mellitus without complication (HCC); Herpes zoster without complication; Hypokalemia due to inadequate potassium intake; and Viral syndrome on their problem list..   She  has a past medical history of Abdominal bloating (07/15/2023), AKI (acute kidney injury) (HCC) (12/14/2021), Allergy, Aortic atherosclerosis (HCC) (08/15/2023), Bilateral lower extremity edema (11/10/2021), Breast asymmetry in left breast on screeing mammogram (08/18/2023), Cataract, Cervicalgia (08/24/2022), Chronic bilateral low back pain with bilateral sciatica (02/25/2022), Chronic cough (10/13/2023), Chronic pain, Class 3 severe obesity due to excess  calories with serious comorbidity and body mass index (BMI) of 45.0 to 49.9 in adult (07/15/2023), Closed fracture of tooth (03/01/2023), Colon, diverticulosis (08/15/2023), COPD (chronic obstructive pulmonary disease) (HCC), Coronary artery disease, Coronary artery disease involving native coronary artery of native heart without angina pectoris (08/15/2023), Depression, major, single  episode, moderate (HCC) (07/15/2023), Diabetes mellitus without complication (HCC), Drug side effects (11/14/2023), Family history of thyroid  disease (10/05/2021), Fatigue (12/24/2014), Frequent falls (03/01/2023), Gastroesophageal reflux disease (11/14/2023), Hepatomegaly (08/15/2023), Hoarseness (10/13/2023), Lumbar foraminal stenosis (03/01/2023), Lymphadenopathy (07/15/2023), Mass of upper outer quadrant of left breast (09/09/2023), Metabolic dysfunction-associated steatotic liver disease (MASLD) (08/15/2023), Myalgia due to statin (07/14/2023), Nausea (10/13/2023), Neck swelling (10/05/2021), Neuralgia (12/10/2022), Obesity, morbid, BMI 40.0-49.9 (HCC) (11/10/2021), OSA (obstructive sleep apnea) (12/14/2021), Pain in thoracic spine (06/07/2023), Paresthesia (06/07/2023), Positive colorectal cancer screening using Cologuard test (07/15/2023), Pruritic dermatitis (08/24/2022), Pulmonary artery hypertension (HCC) (08/15/2023), Rash of both hands (12/10/2022), Recurrent sinus infections (11/10/2021), Rhinitis (11/30/2021), Sedimentation rate elevation (03/24/2023), Seizure-like activity (HCC) (11/14/2022), Simple chronic bronchitis (HCC) (08/16/2023), Sleep apnea, Sleep disturbance (08/24/2022), Small airways disease (08/15/2023), Small vessel disease (HCC) (03/01/2023), Syncope (03/01/2023), Tobacco abuse (11/10/2021), Tonsillitis and adenoiditis, chronic (08/15/2023), Type 2 diabetes mellitus without complication, with long-term current use of insulin (HCC) (08/15/2023), Umbilical hernia without obstruction and without gangrene  (08/15/2023), Vasomotor symptoms due to menopause (11/14/2023), Visual disturbances (11/14/2023), Weakness of extremity (11/14/2022), and Wheezing (12/14/2021)..   She presents with chief complaint of Medical Management of Chronic Issues (3 month follow up. //HM due- diabetic eye and foot exam, vaccinations and colonoscopy ), Rash (Pt c/o of rash on arms. Pt went to the ED on 02/11/2024), and Muscle Pain (Pt c/o of muscle pain with sore throat) .   Discussed the use of AI scribe software for clinical note transcription with the patient, who gave verbal consent to proceed.  History of Present Illness Theresa Marquez is a 58 year old female with diabetes and morbid obesity who presents for follow-up on diabetes management and recent shingles and cellulitis.  Her diabetes is well controlled with a recent A1c of 5.7. She is currently taking semaglutide  1 mg weekly for her diabetes and weight management. No significant weight loss has been noted since starting the 1 mg dose.  Four days ago, she was diagnosed with shingles and cellulitis of the right upper arm at an urgent care visit. She was prescribed acyclovir and doxycycline . The pain is still present, with occasional burning sensations, but it is not as severe as before. She experienced a high fever of 103F, which she managed to reduce by sitting in a pool. Initially, Tylenol  and Advil  were ineffective for the fever.  She describes the onset of her symptoms as initially resembling mosquito bites, which then progressed to pea-sized, hard lesions. The rash spread in a line up her arm to her shoulder, with painful knots along the way. She was concerned it might be a brown recluse spider bite due to a past experience.  She reports a severe sore throat, describing it as feeling like 'swallowing glass,' and difficulty swallowing, with liquids sometimes shooting up her nose. Despite these symptoms, she feels somewhat better today after breaking her  fever.  She has a history of morbid obesity with a BMI of 41, currently weighing 243 pounds. She smokes cigarettes. She was previously on Celebrex  but stopped taking it when she started using other medications for her current condition.     ROS  Past Surgical History:  Procedure Laterality Date   CHOLECYSTECTOMY     DG GALL BLADDER Right    TUBAL LIGATION      Outpatient Medications Prior to Visit  Medication Sig Dispense Refill   Semaglutide ,0.25 or 0.5MG /DOS, (OZEMPIC , 0.25 OR 0.5 MG/DOSE,) 2 MG/3ML SOPN Inject 0.25 mg into the skin.  acyclovir (ZOVIRAX) 800 MG tablet Take 800 mg by mouth 5 (five) times daily.     azelastine  (ASTELIN ) 0.1 % nasal spray Place 2 sprays into both nostrils 2 (two) times daily. Use in each nostril as directed 30 mL 12   bacitracin  500 UNIT/GM ointment Apply 1 Application topically 2 (two) times daily. Apply to affected area BID x 10 days then stop (Patient not taking: Reported on 01/20/2024) 15 g 0   esomeprazole  (NEXIUM ) 40 MG capsule TAKE 1 CAPSULE BY MOUTH EVERY DAY 30 capsule 3   famotidine  (PEPCID ) 40 MG tablet Take 1 tablet (40 mg total) by mouth at bedtime. 90 tablet 3   furosemide  (LASIX ) 40 MG tablet Take 1 tablet (40 mg total) by mouth daily. 90 tablet 3   ipratropium (ATROVENT ) 0.06 % nasal spray Place 2 sprays into both nostrils 2 (two) times daily. 12 mL 2   levalbuterol  (XOPENEX  HFA) 45 MCG/ACT inhaler Inhale 1-2 puffs into the lungs every 6 (six) hours as needed for wheezing. 15 g 0   levocetirizine (XYZAL  ALLERGY 24HR) 5 MG tablet Take 1 tablet (5 mg total) by mouth every evening. 30 tablet 3   mometasone  (NASONEX ) 50 MCG/ACT nasal spray Place 2 sprays into the nose daily. 1 each 12   polyethylene glycol-electrolytes (NULYTELY ) 420 g solution Take by mouth.     pravastatin  (PRAVACHOL ) 20 MG tablet TAKE 1 TABLET BY MOUTH EVERY DAY FOR CHOLESTEROL 90 tablet 0   promethazine -dextromethorphan (PROMETHAZINE -DM) 6.25-15 MG/5ML syrup Take 5 mLs  by mouth 4 (four) times daily as needed. (Patient not taking: Reported on 01/20/2024) 118 mL 0   spironolactone  (ALDACTONE ) 25 MG tablet TAKE 1 TABLET BY MOUTH EVERY DAY 90 tablet 3   Tiotropium Bromide-Olodaterol (STIOLTO RESPIMAT ) 2.5-2.5 MCG/ACT AERS Inhale 2 puffs into the lungs daily. 1 each 11   No facility-administered medications prior to visit.    Family History  Problem Relation Age of Onset   Kidney disease Mother    Rectal cancer Father    Colon cancer Father    Cancer Father    Diabetes Father    Colon cancer Maternal Grandmother    Cerebral palsy Daughter    Breast cancer Neg Hx    Crohn's disease Neg Hx    Colon polyps Neg Hx    Esophageal cancer Neg Hx    Stomach cancer Neg Hx     Social History   Socioeconomic History   Marital status: Divorced    Spouse name: Not on file   Number of children: 3   Years of education: Not on file   Highest education level: Associate degree: academic program  Occupational History   Occupation: [employer]    Comment: Full-Time.   Occupation: unemployed  Tobacco Use   Smoking status: Every Day    Current packs/day: 0.25    Average packs/day: 0.3 packs/day for 54.3 years (13.6 ttl pk-yrs)    Types: Cigarettes    Start date: 10/26/1969   Smokeless tobacco: Never   Tobacco comments:    Smoking cessation  Vaping Use   Vaping status: Never Used  Substance and Sexual Activity   Alcohol use: Not Currently    Comment: socially   Drug use: Never   Sexual activity: Not Currently    Birth control/protection: Post-menopausal  Other Topics Concern   Not on file  Social History Narrative   Right Handed    Lives in a one story home with a basement   Social Drivers of Health  Financial Resource Strain: High Risk (06/13/2023)   Overall Financial Resource Strain (CARDIA)    Difficulty of Paying Living Expenses: Very hard  Food Insecurity: No Food Insecurity (01/17/2024)   Hunger Vital Sign    Worried About Running Out of Food in  the Last Year: Never true    Ran Out of Food in the Last Year: Never true  Transportation Needs: No Transportation Needs (01/17/2024)   PRAPARE - Administrator, Civil Service (Medical): No    Lack of Transportation (Non-Medical): No  Physical Activity: Unknown (06/13/2023)   Exercise Vital Sign    Days of Exercise per Week: 0 days    Minutes of Exercise per Session: Not on file  Stress: No Stress Concern Present (06/13/2023)   [employer] of Occupational Health - Occupational Stress Questionnaire    Feeling of Stress : Only a little  Social Connections: Moderately Isolated (06/13/2023)   Social Connection and Isolation Panel    Frequency of Communication with Friends and Family: Three times a week    Frequency of Social Gatherings with Friends and Family: Never    Attends Religious Services: 1 to 4 times per year    Active Member of [employer] or Organizations: No    Attends Engineer, structural: Not on file    Marital Status: Divorced  Intimate Partner Violence: Not At Risk (02/13/2024)   Received from [facility]   HITS    Over the last 12 months how often did your partner physically hurt you?: Never    Over the last 12 months how often did your partner insult you or talk down to you?: Never    Over the last 12 months how often did your partner threaten you with physical harm?: Never    Over the last 12 months how often did your partner scream or curse at you?: Never                                                                                                  Objective:  Physical Exam: BP 127/81 (BP Location: Left Arm, Patient Position: Sitting, Cuff Size: Large)   Pulse 97   Temp (!) 97.2 F (36.2 C) (Temporal)   Resp 18   Wt 243 lb 3.2 oz (110.3 kg)   LMP  (LMP Unknown)   SpO2 97%   BMI 41.75 kg/m   Wt Readings from Last 3 Encounters:  02/15/24 243 lb 3.2 oz (110.3 kg)  01/20/24 246 lb (111.6 kg)  12/21/23 243 lb 3.2 oz (110.3 kg)     Physical Exam MEASUREMENTS: Weight- 243, BMI- 41.0. GENERAL: Alert, cooperative, well developed, no acute distress. HEENT: Normocephalic, moist mucous membranes, white spots at the back of the throat. CHEST: Clear to auscultation bilaterally, no wheezes, rhonchi, or crackles. CARDIOVASCULAR: Normal heart rate and rhythm, S1 and S2 normal without murmurs. ABDOMEN: Soft, non-tender, non-distended, without organomegaly, normal bowel sounds. EXTREMITIES: No cyanosis or edema. NEUROLOGICAL: Cranial nerves grossly intact, moves all extremities without gross motor or sensory deficit.   Physical Exam  CT MAXILLOFACIAL W CONTRAST Result Date: 12/21/2023  CLINICAL DATA:  Initial evaluation for acute infection, abscess. EXAM: CT MAXILLOFACIAL WITH CONTRAST TECHNIQUE: Multidetector CT imaging of the maxillofacial structures was performed with intravenous contrast. Multiplanar CT image reconstructions were also generated. RADIATION DOSE REDUCTION: This exam was performed according to the departmental dose-optimization program which includes automated exposure control, adjustment of the mA and/or kV according to patient size and/or use of iterative reconstruction technique. CONTRAST:  75mL OMNIPAQUE  IOHEXOL  300 MG/ML  SOLN COMPARISON:  None Available. FINDINGS: Osseous: No acute osseous abnormality. No discrete or worrisome osseous lesions. Left-sided facet arthrosis noted at C2-3. Few small dental caries noted. Orbits: Globes and orbital soft tissues within normal limits. No evidence for intraorbital or postseptal cellulitis. Sinuses: Mild mucosal thickening present about the inferior aspect of the left maxillary sinus. Few small maxillary sinus retention cyst noted. Paranasal sinuses are otherwise clear. Mastoid air cells and middle ear cavities are well pneumatized and free of fluid. Soft tissues: Soft tissue swelling with hazy inflammatory stranding seen involving the right lower face, concerning for  infection/cellulitis (series 301, images 28-14). Changes are localized about the right corner of mouth. No discrete abscess or drainable fluid collection is seen. No extension to involve the deeper spaces of the face/neck at this time. Limited intracranial: Unremarkable. IMPRESSION: Soft tissue swelling with hazy inflammatory stranding involving the right lower face, concerning for acute infection/cellulitis. No discrete abscess or drainable fluid collection. No extension to involve the deeper spaces of the face/neck at this time. Electronically Signed   By: Morene Hoard M.D.   On: 12/21/2023 19:59    Recent Results (from the past 2160 hours)  Comp Met (CMET)     Status: Abnormal   Collection Time: 12/21/23  3:22 PM  Result Value Ref Range   Sodium 134 (L) 135 - 145 mEq/L   Potassium 3.2 (L) 3.5 - 5.1 mEq/L   Chloride 97 96 - 112 mEq/L   CO2 29 19 - 32 mEq/L   Glucose, Bld 131 (H) 70 - 99 mg/dL   BUN 7 6 - 23 mg/dL   Creatinine, Ser 9.17 0.40 - 1.20 mg/dL   Total Bilirubin 0.4 0.2 - 1.2 mg/dL   Alkaline Phosphatase 121 (H) 39 - 117 U/L   AST 20 0 - 37 U/L   ALT 24 0 - 35 U/L   Total Protein 7.8 6.0 - 8.3 g/dL   Albumin 3.5 3.5 - 5.2 g/dL   GFR 21.10 >39.99 mL/min    Comment: Calculated using the CKD-EPI Creatinine Equation (2021)   Calcium  9.5 8.4 - 10.5 mg/dL  CBC w/Diff     Status: Abnormal   Collection Time: 12/21/23  3:22 PM  Result Value Ref Range   WBC 17.7 (H) 4.0 - 10.5 K/uL   RBC 4.42 3.87 - 5.11 Mil/uL   Hemoglobin 14.6 12.0 - 15.0 g/dL   HCT 56.0 63.9 - 53.9 %   MCV 99.2 78.0 - 100.0 fl   MCHC 33.3 30.0 - 36.0 g/dL   RDW 83.2 (H) 88.4 - 84.4 %   Platelets 424.0 (H) 150.0 - 400.0 K/uL   Neutrophils Relative % 71.5 43.0 - 77.0 %   Lymphocytes Relative 21.7 12.0 - 46.0 %   Monocytes Relative 4.4 3.0 - 12.0 %   Eosinophils Relative 1.9 0.0 - 5.0 %   Basophils Relative 0.5 0.0 - 3.0 %   Neutro Abs 12.7 (H) 1.4 - 7.7 K/uL   Lymphs Abs 3.8 0.7 - 4.0 K/uL    Monocytes Absolute 0.8 0.1 -  1.0 K/uL   Eosinophils Absolute 0.3 0.0 - 0.7 K/uL   Basophils Absolute 0.1 0.0 - 0.1 K/uL  C-reactive protein     Status: None   Collection Time: 12/21/23  3:22 PM  Result Value Ref Range   CRP 10.3 0.5 - 20.0 mg/dL  Pulmonary function test     Status: None   Collection Time: 01/20/24  8:06 AM  Result Value Ref Range   FVC-Pre 2.13 L   FVC-%Pred-Pre 63 %   FVC-Post 2.07 L   FVC-%Pred-Post 61 %   FVC-%Change-Post -3 %   FEV1-Pre 1.77 L   FEV1-%Pred-Pre 67 %   FEV1-Post 1.69 L   FEV1-%Pred-Post 64 %   FEV1-%Change-Post -4 %   FEV6-Pre 2.13 L   FEV6-%Pred-Pre 65 %   FEV6-Post 2.06 L   FEV6-%Pred-Post 63 %   FEV6-%Change-Post -3 %   Pre FEV1/FVC ratio 83 %   FEV1FVC-%Pred-Pre 105 %   Post FEV1/FVC ratio 82 %   FEV1FVC-%Change-Post -1 %   Pre FEV6/FVC Ratio 100 %   FEV6FVC-%Pred-Pre 103 %   Post FEV6/FVC ratio 100 %   FEV6FVC-%Pred-Post 103 %   FEF 25-75 Pre 2.06 L/sec   FEF2575-%Pred-Pre 84 %   FEF 25-75 Post 1.39 L/sec   FEF2575-%Pred-Post 56 %   FEF2575-%Change-Post -32 %   RV 1.24 L   RV % pred 63 %   TLC 3.51 L   TLC % pred 69 %   DLCO unc 13.60 ml/min/mmHg   DLCO unc % pred 66 %   DLCO cor 13.14 ml/min/mmHg   DLCO cor % pred 64 %   DL/VA 4.47 ml/min/mmHg/L   DL/VA % pred 869 %        Beverley Adine Hummer, MD, MS

## 2024-02-20 ENCOUNTER — Other Ambulatory Visit (INDEPENDENT_AMBULATORY_CARE_PROVIDER_SITE_OTHER): Payer: Self-pay | Admitting: Otolaryngology

## 2024-02-20 ENCOUNTER — Other Ambulatory Visit: Payer: Self-pay | Admitting: Family Medicine

## 2024-02-20 DIAGNOSIS — G8929 Other chronic pain: Secondary | ICD-10-CM

## 2024-02-20 NOTE — Telephone Encounter (Signed)
 Medication requested is not on her medication list is this ok to fill?  Dis regard previous medication request for Semaglutide , 2 MG/DOSE, 8 MG/3ML SOPN [

## 2024-02-27 NOTE — Progress Notes (Unsigned)
 Cardiology Office Note   Date:  02/28/2024  ID:  Theresa Marquez, DOB 1966/05/11, MRN [id number] PCP: Sebastian Beverley NOVAK, MD  [facility] Providers Cardiologist:  Redell Leiter, MD Cardiology APP:  Carlin Delon BROCKS, NP     History of Present Illness Theresa Marquez is a 58 y.o. female with a past medical history of coronary artery calcifications noted on CT imaging, HFpEF, history of syncope and collapse, suspected OSA, dyslipidemia.  08/13/23 CT chest coronary artery calcifications, dilated main pulmonary artery 12/02/21 echo EF 55-60%, grade I DD  She established care with Dr. Leiter in 2023 for the evaluation of heart failure at the best of her PCP.  She had been admitted  [facility] in June 2023 with shortness of breath, pedal edema, diagnosed with heart failure, she was diuresed 10 pounds during this hospitalization and transition to Lasix , Aldactone , SGLT2.  Most recently evaluated by Dr. Leiter on 06/07/2023 for pedal edema, this was felt to be in the setting of gabapentin  use as well as obesity and OSA.    She presents today with several concerns. She has been bothered for the last few years by several symptoms that have been hard for her to explain. She has been evaluated by pulmonology--advised no COPD; neurology--getting a second opinion by [facility], ENT--advised she has GERD. She states she stays dizzy frequently, and has episodes of syncope. She recounts two episodes where her head will start to nod and then she passes out, she has no other prodromal symptoms. She states she was advised she has OSA, currently not treated as she was advised her tonsils/allergy symptoms would prohibit her from wearing an apparatus, also she feels she does not have OSA as I can sleep for 2-3 days straight. She has some episodes of chest pain, not consistent with angina; has pedal edema if she does not take her diuretic.  She denies palpitations, dyspnea, pnd, orthopnea, n, v,  weight gain, or early satiety.    ROS: Review of Systems  Constitutional:  Positive for malaise/fatigue.  Cardiovascular:  Positive for leg swelling.  Musculoskeletal:  Positive for joint pain and myalgias.  Neurological:  Positive for dizziness and loss of consciousness.  All other systems reviewed and are negative.    Studies Reviewed EKG Interpretation Date/Time:  Tuesday February 28 2024 09:31:22 EDT Ventricular Rate:  92 PR Interval:  150 QRS Duration:  80 QT Interval:  356 QTC Calculation: 440 R Axis:   13  Text Interpretation: Normal sinus rhythm Nonspecific T wave abnormality When compared with ECG of 07-Jun-2023 16:32, No significant change was found Confirmed by Carlin Delon [phone]) on 02/28/2024 9:35:22 AM     Cardiac Studies & Procedures   ______________________________________________________________________________________________     ECHOCARDIOGRAM  ECHOCARDIOGRAM COMPLETE 12/02/2021  Narrative ECHOCARDIOGRAM REPORT    Patient Name:   Theresa Marquez Date of Exam: 12/02/2021 Medical Rec #:  [id number]       Height:       64.0 in Accession #:    [id number]      Weight:       280.6 lb Date of Birth:  12-Sep-1965       BSA:          2.259 m Patient Age:    56 years        BP:           132/73 mmHg Patient Gender: F  HR:           82 bpm. Exam Location:  Inpatient  Procedure: 2D Echo, Cardiac Doppler and Color Doppler  Indications:    Chest pain  History:        Patient has no prior history of Echocardiogram examinations. CHF.  Sonographer:    Elida Casey Referring Phys: [id number] TERRY LOISE HURST   Sonographer Comments: Patient is morbidly obese. Image acquisition challenging due to respiratory motion. IMPRESSIONS   1. Limited study due to poor echo windows. 2. Left ventricular ejection fraction, by estimation, is 55 to 60%. The left ventricle has normal function. Left ventricular endocardial border not optimally defined to evaluate  regional wall motion. Left ventricular diastolic parameters are consistent with Grade I diastolic dysfunction (impaired relaxation). 3. Right ventricular systolic function is normal. The right ventricular size is normal. Tricuspid regurgitation signal is inadequate for assessing PA pressure. 4. The mitral valve is normal in structure. Trivial mitral valve regurgitation. No evidence of mitral stenosis. 5. The aortic valve was not well visualized. Aortic valve regurgitation is not visualized. No aortic stenosis is present. 6. The inferior vena cava is dilated in size with <50% respiratory variability, suggesting right atrial pressure of 15 mmHg. 7. Cannot exclude a small PFO.  Comparison(s): No prior Echocardiogram.  FINDINGS Left Ventricle: Left ventricular ejection fraction, by estimation, is 55 to 60%. The left ventricle has normal function. Left ventricular endocardial border not optimally defined to evaluate regional wall motion. The left ventricular internal cavity size was normal in size. There is no left ventricular hypertrophy. Left ventricular diastolic parameters are consistent with Grade I diastolic dysfunction (impaired relaxation).  Right Ventricle: The right ventricular size is normal. No increase in right ventricular wall thickness. Right ventricular systolic function is normal. Tricuspid regurgitation signal is inadequate for assessing PA pressure.  Left Atrium: Left atrial size was normal in size.  Right Atrium: Right atrial size was normal in size.  Pericardium: There is no evidence of pericardial effusion.  Mitral Valve: The mitral valve is normal in structure. There is mild thickening of the mitral valve leaflet(s). Mild mitral annular calcification. Trivial mitral valve regurgitation. No evidence of mitral valve stenosis.  Tricuspid Valve: The tricuspid valve is normal in structure. Tricuspid valve regurgitation is trivial.  Aortic Valve: The aortic valve was not well  visualized. Aortic valve regurgitation is not visualized. No aortic stenosis is present. Aortic valve peak gradient measures 14.1 mmHg.  Pulmonic Valve: The pulmonic valve was not well visualized. Pulmonic valve regurgitation is trivial.  Aorta: The aortic root and ascending aorta are structurally normal, with no evidence of dilitation.  Venous: The inferior vena cava is dilated in size with less than 50% respiratory variability, suggesting right atrial pressure of 15 mmHg.  IAS/Shunts: Cannot exclude a small PFO.   LEFT VENTRICLE PLAX 2D LVIDd:         6.10 cm Diastology LVIDs:         5.10 cm LV e' medial:    6.90 cm/s LV PW:         0.90 cm LV E/e' medial:  13.5 LV IVS:        1.00 cm LV e' lateral:   9.14 cm/s LV E/e' lateral: 10.2   RIGHT VENTRICLE          IVC RV Basal diam:  3.40 cm  IVC diam: 2.30 cm RV Mid diam:    3.20 cm TAPSE (M-mode): 3.0 cm  LEFT ATRIUM  Index        RIGHT ATRIUM           Index LA diam:      3.50 cm 1.55 cm/m   RA Area:     21.40 cm LA Vol (A2C): 56.1 ml 24.83 ml/m  RA Volume:   62.50 ml  27.66 ml/m LA Vol (A4C): 57.7 ml 25.54 ml/m AORTIC VALVE               PULMONIC VALVE AV Vmax:      187.50 cm/s  PV Vmax:       1.32 m/s AV Peak Grad: 14.1 mmHg    PV Peak grad:  7.0 mmHg LVOT Vmax:    166.00 cm/s LVOT Vmean:   114.000 cm/s LVOT VTI:     0.352 m  AORTA Ao Asc diam: 3.30 cm  MITRAL VALVE MV Area (PHT): 4.06 cm    SHUNTS MV Decel Time: 187 msec    Systemic VTI: 0.35 m MV E velocity: 93.00 cm/s MV A velocity: 97.90 cm/s MV E/A ratio:  0.95  Powell Sorrow MD Electronically signed by Powell Sorrow MD Signature Date/Time: 12/02/2021/10:17:01 AM    Final          ______________________________________________________________________________________________      Risk Assessment/Calculations           Physical Exam VS:  BP 102/64   Pulse 92   Ht 5' 4 (1.626 m)   Wt 239 lb 3.2 oz (108.5 kg)   LMP  (LMP  Unknown)   SpO2 97%   BMI 41.06 kg/m        Wt Readings from Last 3 Encounters:  02/28/24 239 lb 3.2 oz (108.5 kg)  02/15/24 243 lb 3.2 oz (110.3 kg)  01/20/24 246 lb (111.6 kg)    GEN: Well nourished, well developed in no acute distress NECK: No JVD; No carotid bruits CARDIAC: RRR, no murmurs, rubs, gallops RESPIRATORY:  Clear to auscultation without rales, wheezing or rhonchi  ABDOMEN: Soft, non-tender, non-distended EXTREMITIES:  No edema; No deformity   ASSESSMENT AND PLAN Syncope and collapse-her symptoms are very hard to decipher her, she does not appear to have any prodromal symptoms.  Will arrange a live monitor to rule out any arrhythmias.  Will arrange for repeat echocardiogram as well.  Coronary artery calcifications-this was noted on CT imaging, she is having some episodes of chest pain but they do not sound to be consistent with angina however she has comorbid conditions so we will arrange for coronary CTA to rule out any obstructive disease.  Will premedicate with metoprolol  100 mg p.o. prior to her exam and check BMET.  DOE-likely multifactorial, repeating echocardiogram for history of syncope.  Dyslipidemia with statin intolerance- followed by PCP, most recent LDL was 148 which is obviously uncontrolled, will make recommendations for lipid-lowering medication after her upcoming coronary CTA.  Preoperative cardiovascular evaluation-upcoming colonoscopy.  Obviously we need to evaluate for causes of syncope prior to clearing her for this.       Dispo: Live monitor for 2 weeks, echocardiogram for any structural abnormalities, coronary CTA, follow up in 8 weeks with Dr. Monetta.   Signed, Delon JAYSON Hoover, NP

## 2024-02-28 ENCOUNTER — Ambulatory Visit: Attending: Cardiology | Admitting: Cardiology

## 2024-02-28 ENCOUNTER — Ambulatory Visit: Attending: Cardiology

## 2024-02-28 ENCOUNTER — Encounter: Payer: Self-pay | Admitting: Cardiology

## 2024-02-28 VITALS — BP 102/64 | HR 92 | Ht 64.0 in | Wt 239.2 lb

## 2024-02-28 DIAGNOSIS — I2510 Atherosclerotic heart disease of native coronary artery without angina pectoris: Secondary | ICD-10-CM

## 2024-02-28 DIAGNOSIS — R0600 Dyspnea, unspecified: Secondary | ICD-10-CM | POA: Diagnosis not present

## 2024-02-28 DIAGNOSIS — R55 Syncope and collapse: Secondary | ICD-10-CM

## 2024-02-28 DIAGNOSIS — R072 Precordial pain: Secondary | ICD-10-CM

## 2024-02-28 DIAGNOSIS — Z01818 Encounter for other preprocedural examination: Secondary | ICD-10-CM

## 2024-02-28 DIAGNOSIS — I700 Atherosclerosis of aorta: Secondary | ICD-10-CM

## 2024-02-28 DIAGNOSIS — Z01812 Encounter for preprocedural laboratory examination: Secondary | ICD-10-CM

## 2024-02-28 MED ORDER — METOPROLOL TARTRATE 100 MG PO TABS: 100.0000 mg | ORAL_TABLET | Freq: Once | ORAL | 0 refills | Status: DC

## 2024-02-28 NOTE — Patient Instructions (Signed)
 Medication Instructions:  Your physician recommends that you continue on your current medications as directed. Please refer to the Current Medication list given to you today.  *If you need a refill on your cardiac medications before your next appointment, please call your pharmacy*  Lab Work: NONE If you have labs (blood work) drawn today and your tests are completely normal, you will receive your results only by: MyChart Message (if you have MyChart) OR A paper copy in the mail If you have any lab test that is abnormal or we need to change your treatment, we will call you to review the results.  Testing/Procedures: Your physician has requested that you have an echocardiogram. Echocardiography is a painless test that uses sound waves to create images of your heart. It provides your doctor with information about the size and shape of your heart and how well your heart's chambers and valves are working. This procedure takes approximately one hour. There are no restrictions for this procedure. Please do NOT wear cologne, perfume, aftershave, or lotions (deodorant is allowed). Please arrive 15 minutes prior to your appointment time.  Please note: We ask at that you not bring children with you during ultrasound (echo/ vascular) testing. Due to room size and safety concerns, children are not allowed in the ultrasound rooms during exams. Our front office staff cannot provide observation of children in our lobby area while testing is being conducted. An adult accompanying a patient to their appointment will only be allowed in the ultrasound room at the discretion of the ultrasound technician under special circumstances. We apologize for any inconvenience.   You have been asked to wear a [employer] Heart Monitor today. It is to be worn for 14 days. Please remove the monitor on 03/13/24 and mail back in the box provided.  If you have any questions about the monitor please call the company at [phone]      Your cardiac CT will be scheduled at one of the below locations:   [facility] [address] [place], [place] [phone]  Please follow these instructions carefully (unless otherwise directed):  An IV will be required for this test and Nitroglycerin  will be given.  Hold all erectile dysfunction medications at least 3 days (72 hrs) prior to test. (Ie viagra, cialis, sildenafil, tadalafil, etc)   On the Night Before the Test: Be sure to Drink plenty of water. Do not consume any caffeinated/decaffeinated beverages or chocolate 12 hours prior to your test. Do not take any antihistamines 12 hours prior to your test.   On the Day of the Test: Drink plenty of water until 1 hour prior to the test. Do not eat any food 1 hour prior to test. You may take your regular medications prior to the test.  Take metoprolol  (Lopressor ) two hours prior to test. If you take Furosemide /Hydrochlorothiazide/Spironolactone /Chlorthalidone, please HOLD on the morning of the test. Patients who wear a continuous glucose monitor MUST remove the device prior to scanning. FEMALES- please wear underwire-free bra if available, avoid dresses & tight clothing  After the Test: Drink plenty of water. After receiving IV contrast, you may experience a mild flushed feeling. This is normal. On occasion, you may experience a mild rash up to 24 hours after the test. This is not dangerous. If this occurs, you can take Benadryl 25 mg, Zyrtec , Claritin , or Allegra and increase your fluid intake. (Patients taking Tikosyn should avoid Benadryl, and may take Zyrtec , Claritin , or Allegra) If you experience trouble breathing, this can  be serious. If it is severe call 911 IMMEDIATELY. If it is mild, please call our office.  We will call to schedule your test 2-4 weeks out understanding that some insurance companies will need an authorization prior to the service being performed.   For more information and frequently  asked questions, please visit our website : [email or web address]  For non-scheduling related questions, please contact the cardiac imaging nurse navigator should you have any questions/concerns: Cardiac Imaging Nurse Navigators Direct Office Dial: [phone]   For scheduling needs, including cancellations and rescheduling, please call [place], [phone].        Follow-Up: At [facility], you and your health needs are our priority.  As part of our continuing mission to provide you with exceptional heart care, our providers are all part of one team.  This team includes your primary Cardiologist (physician) and Advanced Practice Providers or APPs (Physician Assistants and Nurse Practitioners) who all work together to provide you with the care you need, when you need it.  Your next appointment:   8 week(s)  Provider:   Redell Leiter, MD    We recommend signing up for the patient portal called MyChart.  Sign up information is provided on this After Visit Summary.  MyChart is used to connect with patients for Virtual Visits (Telemedicine).  Patients are able to view lab/test results, encounter notes, upcoming appointments, etc.  Non-urgent messages can be sent to your provider as well.   To learn more about what you can do with MyChart, go to [email or web address].   Other Instructions

## 2024-02-29 ENCOUNTER — Ambulatory Visit: Payer: Self-pay | Admitting: Cardiology

## 2024-02-29 LAB — BASIC METABOLIC PANEL WITH GFR
BUN/Creatinine Ratio: 13 (ref 9–23)
BUN: 12 mg/dL (ref 6–24)
CO2: 23 mmol/L (ref 20–29)
Calcium: 10.1 mg/dL (ref 8.7–10.2)
Chloride: 96 mmol/L (ref 96–106)
Creatinine, Ser: 0.92 mg/dL (ref 0.57–1.00)
Glucose: 81 mg/dL (ref 70–99)
Potassium: 4.7 mmol/L (ref 3.5–5.2)
Sodium: 136 mmol/L (ref 134–144)
eGFR: 72 mL/min/1.73

## 2024-03-02 ENCOUNTER — Encounter (HOSPITAL_COMMUNITY): Payer: Self-pay

## 2024-03-06 ENCOUNTER — Ambulatory Visit (HOSPITAL_BASED_OUTPATIENT_CLINIC_OR_DEPARTMENT_OTHER): Admission: RE | Admit: 2024-03-06 | Source: Ambulatory Visit

## 2024-03-15 ENCOUNTER — Other Ambulatory Visit: Payer: Self-pay | Admitting: Family Medicine

## 2024-03-15 DIAGNOSIS — L03113 Cellulitis of right upper limb: Secondary | ICD-10-CM

## 2024-03-15 DIAGNOSIS — B029 Zoster without complications: Secondary | ICD-10-CM

## 2024-03-16 ENCOUNTER — Ambulatory Visit (INDEPENDENT_AMBULATORY_CARE_PROVIDER_SITE_OTHER): Admitting: Neurology

## 2024-03-16 ENCOUNTER — Ambulatory Visit: Admitting: Cardiology

## 2024-03-16 ENCOUNTER — Encounter (HOSPITAL_COMMUNITY): Payer: Self-pay

## 2024-03-16 ENCOUNTER — Encounter: Payer: Self-pay | Admitting: Neurology

## 2024-03-16 VITALS — BP 126/77 | HR 88 | Ht 64.0 in | Wt 241.0 lb

## 2024-03-16 DIAGNOSIS — G4711 Idiopathic hypersomnia with long sleep time: Secondary | ICD-10-CM

## 2024-03-16 DIAGNOSIS — G4710 Hypersomnia, unspecified: Secondary | ICD-10-CM

## 2024-03-16 DIAGNOSIS — R55 Syncope and collapse: Secondary | ICD-10-CM

## 2024-03-16 DIAGNOSIS — R062 Wheezing: Secondary | ICD-10-CM | POA: Diagnosis not present

## 2024-03-16 DIAGNOSIS — Z7951 Long term (current) use of inhaled steroids: Secondary | ICD-10-CM

## 2024-03-16 DIAGNOSIS — G4733 Obstructive sleep apnea (adult) (pediatric): Secondary | ICD-10-CM | POA: Diagnosis not present

## 2024-03-16 DIAGNOSIS — J449 Chronic obstructive pulmonary disease, unspecified: Secondary | ICD-10-CM
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
## 2024-03-16 NOTE — Patient Instructions (Addendum)
 COPD Chronic Obstructive Pulmonary Disease  Chronic obstructive pulmonary disease (COPD) is a long-term (chronic) lung problem. When you have COPD, it can feel harder to breathe in or out. The condition may get worse over time. There are things you can do to keep yourself as healthy as possible. What are the causes? Smoking. This is the most common cause. Breathing in fumes, smoke, or chemicals for a long time. Genes that are inherited, which means they are passed down from parent to child. What are the signs or symptoms? Shortness of breath. This may happen all the time. This may get worse when you move your body. This may get worse over time. You may have times when this becomes much worse all of a sudden. These are called flare-ups or exacerbations. A long-term cough, with or without thick mucus. Wheezing. Chest tightness. Feeling tired. Not being able to do activities like you used to do. How is this diagnosed? This condition is diagnosed based on: Your medical history. A physical exam. Lung (pulmonary) function tests. You may have a test that measures the air flow out of the lungs when you breathe out. You may also have tests, including: Chest X-ray. CT scan. Blood tests. How is this treated? This condition may be treated by: Quitting smoking, if you smoke. Using oxygen. Taking medicines. These may include: Inhalers. These have medicines in them that you breathe in. Daily inhalers. These help to prevent symptoms from happening. They are usually taken every day to prevent COPD flare-ups. Quick relief inhalers. These act fast to relieve symptoms. They are used only when needed and provide short-term relief. Other medicines that you breathe in or swallow. These may be used to open the airways, thin mucus, or treat infections. Breathing exercises to help you control or catch your breath. A mucus clearing device, if you have a lot of thick mucus. Pulmonary rehab. A place  where you will learn about your condition and the best ways for you to manage it. Surgery. Follow these instructions at home: Medicines Take your medicines as told by your health care provider. Talk to your provider before taking any cough or allergy medicines. You may need to avoid medicines that cause your lungs to be dry. Lifestyle Several times a day, wash your hands with soap and water for at least 20 seconds. If you cannot use soap and water, use hand sanitizer. This may help keep you from getting an infection. Avoid being around crowds or people who are sick. Do not smoke or use any products that contain nicotine  or tobacco. If you need help quitting, ask your provider. Stay active. Learn how to pace your activity during the day. Learn how to breathe to control your stress and catch your breath. Drink enough fluid to keep your pee (urine) pale yellow, unless you have been told not to. Eat healthy foods. Eat smaller meals more often. Get enough sleep. Most adults need 7 or more hours per night. General instructions Make a COPD action plan with your provider. This helps you to know what to do if you feel worse than usual. Make sure you get all the shots, also called vaccines, that your provider recommends. Ask your provider about a flu shot and a pneumonia shot. If you need home oxygen therapy, ask your provider how often to check your oxygen level with a device called an oximeter. Keep all follow-up visits to review your COPD action plan. Your provider will want to check on your condition often to  keep you healthy and out of the hospital. Contact a health care provider if: You are coughing up more mucus than usual. There is a change in the color or thickness of the mucus. It is harder to breathe than usual or you are short of breath while you are resting. You need to use your quick relief inhaler more often. You have trouble doing your normal activities such as getting dressed or  walking in the house. Your skin color or fingernails turn blue. You have a fever or chills. Get help right away if: You are short of breath and cannot: Talk in full sentences. Do normal activities. You have chest pain. You feel confused. These symptoms may be an emergency. Call 911 right away. Do not wait to see if the symptoms will go away. Do not drive yourself to the hospital. This information is not intended to replace advice given to you by your health care provider. Make sure you discuss any questions you have with your health care provider. Document Revised: 03/17/2023 Document Reviewed: 08/30/2022 Elsevier Patient Education  2024 Else[employer] Inc.  CPAP and BIPAP Information CPAP and BIPAP use air pressure to keep your airways open and help you breathe well. CPAP and BIPAP use different amounts of pressure. Your health care provider will tell you whether CPAP or BIPAP would be best for you. CPAP stands for continuous positive airway pressure. With CPAP, the amount of pressure stays the same while you breathe in and out. BIPAP stands for bi-level positive airway pressure. With BIPAP, the amount of pressure will be higher when you breathe in and lower when you breathe out. This allows you to take bigger breaths. CPAP or BIPAP may be used in the hospital or at home. You may need to have a sleep study before your provider can order a device for you to use at home. What are the advantages? CPAP and BIPAP are most often used for obstructive sleep apnea to keep the airways from collapsing when the muscles relax during sleep. CPAP or BIPAP can be used if you have: Chronic obstructive pulmonary disease. Heart failure. Medical conditions that cause muscle weakness. Other problems that cause breathing to be shallow, weak, or difficult. What are the risks? Your provider will talk with you about risks. These may include: Sores on your nose or face caused from the mask, prongs, or nasal  pillows. Dry or stuffy nose or nosebleeds. Feeling gassy or bloated. Sinus or lung infection if the equipment is not cleaned well. When should CPAP or BIPAP be used? In most cases, CPAP or BIPAP is used during sleep at night or whenever the main sleep time happens. It's also used during naps. People with some medical conditions may need to wear the mask when they're awake. Follow instructions from your provider about when to use your CPAP or BIPAP. What happens during CPAP or BIPAP?  Both CPAP and BIPAP use a small machine that uses electricity to create air pressure. A long tube connects the device to a plastic mask. Air is blown through the mask into your nose or mouth. The amount of pressure that's used to blow the air can be adjusted. Your provider will set the pressure setting and help you find the best mask for you. Tips for using the mask There are different types and sizes of masks. If your mask does not fit well, talk with your provider about getting a different one. Some common types of masks include: Full face masks, which fit  over the mouth and nose. Nasal masks, which fit over the nose. Nasal pillow or prong masks, which fit into the nostrils. The mask needs to be snug to your face, so some people feel trapped or closed in at first. If you feel this way, you may need to get used to the mask. Hold the mask loosely over your nose or mouth and then gradually put the the mask on more snugly. Slowly increase the amount of time you use the mask. If you have trouble with your mask not fitting well or leaking, talk with your provider. Do not stop using the mask. Tips for using the device Follow instructions from your provider about how to and how often to use the device. For home use, CPAP and BIPAP devices come from home health care companies. There are many different brands. Your health insurance company will help to decide which device you get. Keep the CPAP or BIPAP device and  attachments clean. Ask your home health care company or check the instruction book for cleaning instructions. Make sure the humidifier is filled with germ-free (sterile) water and is working correctly. This will help prevent a dry or stuffy nose or nosebleeds. A nasal saline mist or spray may keep your nose from getting dry and sore. Do not eat or drink while the CPAP or BIPAP device is on. Food or drinks could get pushed into your lungs by the pressure of the CPAP or BIPAP. Follow these instructions at home: Take over-the-counter and prescription medicines only as told by your provider. Do not smoke, vape, or use nicotine  or tobacco. Contact a health care provider if: You have redness or pressure sores on your head, face, mouth, or nose from the mask or headgear. You have trouble using the CPAP or BIPAP device. You have trouble going to sleep or staying asleep. Someone tells you that you snore even when wearing your CPAP or BIPAP device. Get help right away if: You have trouble breathing. You feel confused. These symptoms may be an emergency. Get help right away. Call 911. Do not wait to see if the symptoms will go away. Do not drive yourself to the hospital. This information is not intended to replace advice given to you by your health care provider. Make sure you discuss any questions you have with your health care provider. Document Revised: 10/06/2022 Document Reviewed: 10/06/2022 Elsevier Patient Education  2024 Elsevier Inc.  Narcolepsy Narcolepsy is a disorder that causes people to fall asleep suddenly and without control (have sleep attacks) during the daytime. It is a lifelong disorder. Narcolepsy disrupts the sleep cycle at night, which then causes daytime sleepiness. What are the causes? The cause of narcolepsy is not fully understood, but it may be related to: Low levels of hypocretin, a chemical (neurotransmitter) in the brain that controls sleep and wake cycles. Hypocretin  imbalance may be caused by: Abnormal genes that are passed from parent to child (inherited). An autoimmune disease in which the body's defense system (immune system) attacks the brain cells that make hypocretin. Infection, tumor, or injury in the area of the brain that controls sleep. Exposure to poisons (toxins), such as heavy metals, pesticides, and secondhand smoke. What are the signs or symptoms? Symptoms of this condition include: Excessive daytime sleepiness. This is the most common symptom and is usually the first symptom you will notice. This may affect your performance at work or school. Sleep attacks. You may fall asleep in the middle of an activity, especially low-energy  activities like reading or watching TV. Feeling like you cannot think clearly and having trouble focusing or remembering things. You may also feel depressed. Sudden muscle weakness (cataplexy). When this occurs, your speech may become slurred, or your knees may buckle. Cataplexy is usually triggered by surprise, anger, fear, or laughter. Losing the ability to speak or move (sleep paralysis). This may occur just as you start to fall asleep or wake up. You will be aware of the paralysis. It usually lasts for just a few seconds or minutes. Seeing, hearing, tasting, smelling, or feeling things that are not real (hallucinations). Hallucinations may occur with sleep paralysis. They can happen when you are falling asleep, waking up, or dozing. Trouble staying asleep at night (insomnia) and restless sleep. How is this diagnosed? This condition may be diagnosed based on: A physical exam to rule out any other problems that may be causing your symptoms. You may be asked to write down your sleeping patterns for several weeks in a sleep diary. This will help your health care provider make a diagnosis. Sleep studies that measure how well your REM sleep is regulated. These tests also measure your heart rate, breathing, movement, and  brain waves. These tests include: An overnight sleep study (polysomnogram). A daytime sleep study that is done while you take several naps during the day (multiple sleep latency test, MSLT). This test measures how quickly you fall asleep and how quickly you enter REM sleep. Removal of spinal fluid to measure hypocretin levels. How is this treated? There is no cure for this condition, but treatment can help relieve symptoms. Treatment may include: Lifestyle and sleeping strategies to help you cope with the condition, such as: Exercising regularly. Maintaining a regular sleep schedule. Avoiding caffeine and large meals before bed. Medicines. These may include: Medicines that help keep you awake and alert (stimulants) to fight daytime sleepiness. Medicines that treat depression (antidepressants). These may be used to treat cataplexy. Sodium oxybate. This is a strong medicine to help you relax (sedative) that you may take at night. It can help control daytime sleepiness and cataplexy. Other treatments may include mental health counseling or joining a support group. Follow these instructions at home: Sleeping habits  Get about 8 hours of sleep every night. Go to sleep and get up at about the same time every day. Keep your bedroom dark, quiet, and comfortable. When you feel very tired, take short naps. Schedule naps so that you take them at about the same time every day. Before bedtime: Avoid bright lights and screens. Relax. Try activities like reading or taking a warm bath. Activity Get at least 20 minutes of exercise every day. This will help you sleep better at night and reduce daytime sleepiness. Avoid exercising within 3 hours of bedtime. Do not drive or use machinery if you are sleepy. If possible, take a nap before driving. Do not swim or go out on the water without a life jacket. Eating and drinking Do not drink alcohol or caffeinated beverages within 4-5 hours of bedtime. Do not  eat a large meal before bedtime. Eat meals at about the same times every day. General instructions  Take over-the-counter and prescription medicines only as told by your health care provider. Keep a sleep diary as told by your health care provider. Tell your employer or teachers that you have narcolepsy. You may be able to adjust your schedule to include time for naps. Do not use any products that contain nicotine  or tobacco. These products include  cigarettes, chewing tobacco, and vaping devices, such as e-cigarettes. If you need help quitting, ask your health care provider. Where to find more information [employer] of Neurological Disorders and Stroke: [email or web address] Contact a health care provider if: Your symptoms are not getting better. You have fast or irregular heartbeats (palpitations). You are having a hard time determining what is real and what is not (psychosis). Get help right away if: You hurt yourself during a sleep attack or an attack of cataplexy. You have chest pain. You have trouble breathing. These symptoms may be an emergency. Get help right away. Call 911. Do not wait to see if the symptoms will go away. Do not drive yourself to the hospital. Summary Narcolepsy is a disorder that causes people to fall asleep suddenly and without control during the daytime (sleep attacks). Narcolepsy is a lifelong disorder with no cure. Treatment can help relieve symptoms. Go to sleep and get up at about the same time every day. Follow instructions about sleep and activities as told by your health care provider. Take over-the-counter and prescription medicines only as told by your health care provider. This information is not intended to replace advice given to you by your health care provider. Make sure you discuss any questions you have with your health care provider. Document Revised: 10/16/2021 Document Reviewed: 10/16/2021 Elsevier Patient Education  2024 Elsevier  Inc.Hypersomnia Hypersomnia is a condition in which a person feels very tired during the day even though the person gets plenty of sleep at night. A person with this condition may take naps during the day and may find it very difficult to wake up from sleep. Hypersomnia may affect a person's ability to think, concentrate, drive, or remember things. What are the causes? The cause of this condition may not be known. Possible causes include: Taking certain medicines. Using drugs or alcohol. Sleep disorders, such as narcolepsy and sleep apnea. Injury to the head, brain, or spinal cord. Tumors. Certain medical conditions. These include: Depression. Diabetes. Gastroesophageal reflux disease (GERD). An underactive thyroid  gland (hypothyroidism). What are the signs or symptoms? The main symptoms of hypersomnia include: Feeling very tired throughout the day, regardless of how much sleep you got the night before. Having trouble waking up. Others may find it difficult to wake you up when you are sleeping. Sleeping for longer and longer periods at a time. Taking naps throughout the day. Other symptoms may include: Feeling restless, anxious, or annoyed. Lacking energy. Having trouble with: Remembering. Speaking. Thinking. Loss of appetite. Seeing, hearing, tasting, smelling, or feeling things that are not real (hallucinations). How is this diagnosed? This condition may be diagnosed based on: Your symptoms and medical history. Your sleeping habits. Your health care provider may ask you to write down your sleeping habits in a daily sleep log, along with any symptoms you have. A series of tests that are done while you sleep (sleep study or polysomnogram). A test that measures how quickly you can fall asleep during the day (daytime nap study or multiple sleep latency test). How is this treated? This condition may be treated by: Following a regular sleep routine. Making lifestyle changes, such  as changing your eating habits, getting regular exercise, and avoiding alcohol or caffeinated beverages. Taking medicines to make you more alert (stimulants) during the day. Treating any underlying medical causes of hypersomnia. Follow these instructions at home: Sleep habits Stick to a routine that includes going to bed and waking up at the same times every day and night. Practice  a relaxing bedtime routine. This may include reading, meditation, deep breathing, or taking a warm bath before going to sleep. Exercise regularly as told by your health care provider. However, avoid exercising in the hours right before bedtime. Keep your sleep environment at a cooler temperature, darkened, and quiet. Sleep with pillows and a mattress that are comfortable and supportive. Schedule short 20-minute naps for when you feel sleepiest during the day. Talk with your employer or teachers about your hypersomnia. If possible, adjust your schedule so that: You have a regular daytime work schedule. You can take a scheduled nap during the day. You do not have to work or be active at night. Do not eat a heavy meal for a few hours before bedtime. Eat your meals at about the same times every day. Safety  Do not drive or use machinery if you are sleepy. Ask your health care provider if it is safe for you to drive. Wear a life jacket when swimming or spending time near water. General instructions  Take over-the-counter and prescription medicines only as told by your health care provider. This includes supplements. Avoid drinking alcohol or caffeinated beverages. Keep a sleep log that will help your health care provider manage your condition. This may include information about: What time you go to bed each night. How often you wake up at night. How many hours you sleep at night. How often and for how long you nap during the day. Any observations from others, such as leg movements during sleep, sleep walking, or  snoring. Keep all follow-up visits. This is important. Contact a health care provider if: You have new symptoms. Your symptoms get worse. Get help right away if: You have thoughts about hurting yourself or someone else. Get help right away if you feel like you may hurt yourself or others, or have thoughts about taking your own life. Go to your nearest emergency room or: Call 911. Call the National Suicide Prevention Lifeline at (442)821-3868 or 988. This is open 24 hours a day. Text the Crisis Text Line at 2180448776. Summary Hypersomnia refers to a condition in which you feel very tired during the day even though you get plenty of sleep at night. A person with this condition may take naps during the day and may find it very difficult to wake up from sleep. Hypersomnia may affect a person's ability to think, concentrate, drive, or remember things. Treatment may include a regular sleep routine and making some lifestyle changes. This information is not intended to replace advice given to you by your health care provider. Make sure you discuss any questions you have with your health care provider. Document Revised: 05/25/2021 Document Reviewed: 05/25/2021 Elsevier Patient Education  2024 [employer].

## 2024-03-16 NOTE — Addendum Note (Signed)
 Addended by: CHALICE SAUNAS on: 03/16/2024 04:44 PM   Modules accepted: Orders

## 2024-03-19 ENCOUNTER — Telehealth (HOSPITAL_COMMUNITY): Payer: Self-pay | Admitting: *Deleted

## 2024-03-19 NOTE — Telephone Encounter (Signed)
 Reaching out to patient to offer assistance regarding upcoming cardiac imaging study; pt verbalizes understanding of appt date/time, parking situation and where to check in, pre-test NPO status and medications ordered, and verified current allergies; name and call back number provided for further questions should they arise Sid Seats RN Navigator Cardiac Imaging [facility] [phone] office [phone] cell

## 2024-03-20 ENCOUNTER — Encounter (HOSPITAL_BASED_OUTPATIENT_CLINIC_OR_DEPARTMENT_OTHER): Payer: Self-pay

## 2024-03-20 ENCOUNTER — Ambulatory Visit (HOSPITAL_BASED_OUTPATIENT_CLINIC_OR_DEPARTMENT_OTHER)
Admission: RE | Admit: 2024-03-20 | Discharge: 2024-03-20 | Disposition: A | Discharge status: Home / Self Care | Source: Ambulatory Visit | Attending: Cardiology | Admitting: Cardiology

## 2024-03-20 DIAGNOSIS — R072 Precordial pain: Secondary | ICD-10-CM | POA: Insufficient documentation

## 2024-03-20 MED ORDER — IOHEXOL 350 MG/ML SOLN
100.0000 mL | Freq: Once | INTRAVENOUS | Status: AC | PRN
  Administered 2024-03-20: 110 mL via INTRAVENOUS

## 2024-03-20 MED ORDER — METOPROLOL TARTRATE 5 MG/5ML IV SOLN
10.0000 mg | Freq: Once | INTRAVENOUS | Status: AC
  Administered 2024-03-20: 10 mg via INTRAVENOUS

## 2024-03-20 MED ORDER — NITROGLYCERIN 0.4 MG SL SUBL
0.8000 mg | SUBLINGUAL_TABLET | Freq: Once | SUBLINGUAL | Status: AC
  Administered 2024-03-20: 0.8 mg via SUBLINGUAL

## 2024-03-21 ENCOUNTER — Ambulatory Visit (HOSPITAL_BASED_OUTPATIENT_CLINIC_OR_DEPARTMENT_OTHER)
Admission: RE | Admit: 2024-03-21 | Discharge: 2024-03-21 | Disposition: A | Discharge status: Home / Self Care | Source: Ambulatory Visit | Attending: Cardiology | Admitting: Cardiology

## 2024-03-21 DIAGNOSIS — R0609 Other forms of dyspnea: Secondary | ICD-10-CM

## 2024-03-21 DIAGNOSIS — R0600 Dyspnea, unspecified: Secondary | ICD-10-CM | POA: Insufficient documentation

## 2024-03-21 LAB — ECHOCARDIOGRAM COMPLETE
AR max vel: 2.22 cm2
AV Area VTI: 2.19 cm2
AV Area mean vel: 2.02 cm2
AV Mean grad: 7 mmHg
AV Peak grad: 12.3 mmHg
Ao pk vel: 1.75 m/s
Area-P 1/2: 2.91 cm2
Calc EF: 58.2 %
MV M vel: 2.31 m/s
MV Peak grad: 21.3 mmHg
S' Lateral: 3.1 cm
Single Plane A2C EF: 59.7 %
Single Plane A4C EF: 55.5 %

## 2024-03-22 DIAGNOSIS — R55 Syncope and collapse: Secondary | ICD-10-CM | POA: Diagnosis not present

## 2024-03-23 ENCOUNTER — Telehealth: Payer: Self-pay

## 2024-03-23 NOTE — Telephone Encounter (Signed)
 Pt viewed Echo results on My Chart per Delon Carlin Bari note. Routed to PCP.

## 2024-03-23 NOTE — Telephone Encounter (Signed)
 Pt viewed CT Angio results on My Chart per Delon Carlin Bari note. Routed to PCP.

## 2024-03-30 ENCOUNTER — Ambulatory Visit: Payer: Self-pay | Admitting: Neurology

## 2024-03-30 DIAGNOSIS — G4733 Obstructive sleep apnea (adult) (pediatric): Secondary | ICD-10-CM

## 2024-03-30 DIAGNOSIS — G4710 Hypersomnia, unspecified: Secondary | ICD-10-CM

## 2024-03-30 DIAGNOSIS — J438 Other emphysema: Secondary | ICD-10-CM

## 2024-03-30 DIAGNOSIS — E66813 Obesity, class 3: Secondary | ICD-10-CM

## 2024-03-30 DIAGNOSIS — R55 Syncope and collapse: Secondary | ICD-10-CM

## 2024-03-30 LAB — NARCOLEPSY EVALUATION
DQA1*01:02: POSITIVE
DQB1*06:02: NEGATIVE

## 2024-04-09 ENCOUNTER — Ambulatory Visit: Admitting: Neurology

## 2024-04-09 DIAGNOSIS — R55 Syncope and collapse: Secondary | ICD-10-CM | POA: Diagnosis not present

## 2024-04-09 DIAGNOSIS — G4711 Idiopathic hypersomnia with long sleep time: Secondary | ICD-10-CM

## 2024-04-09 DIAGNOSIS — G4710 Hypersomnia, unspecified: Secondary | ICD-10-CM

## 2024-04-20 NOTE — Procedures (Signed)
 [facility]  EEG (ELECTROENCEPHALOGRAM) REPORT  [email or web address] [place]   ORDERING CLINICIAN: Dedra Gores, M.D.  TECHNOLOGIST:  Burnard Plummer , REEGT TECHNIQUE:  This EEG study was done with scalp electrodes positioned according to the 10-20 International system of electrode placement. Electrical activity was reviewed with band pass filter of 1-70Hz , sensitivity of 7 uV/mm, display speed of 43mm/sec with a 60Hz  notched filter applied as appropriate. EEG data were recorded continuously and digitally stored.    Recoding duration : Activation included:  Photic stimulation and Hyperventilation .      Description: The EEG's posterior dominant background rhythm of 10.5 hertz was symmetrically displayed while the patient's eyes were closed and promptly attenuated with eye opening. At baseline, the recording showed a moderately -high amplitude EEG in symmetric fashion.  Photic stimulation was initiated at frequencies from 3- through 21 hertz, resulting in photic entrainment at  frequencies above 9 herz ,  without periodic or rhythmic discharges, or epileptiform activity.  Hyperventilation maneuver was initiated,  leading to further amplitude build-up without epileptiform activity.   The patient is awake and drowsy and finally asleep during the recording.   During drowsiness, there is an increase in theta slowing of the background.  Sleep was captured following HV maneuver,  characterized by vertex sharp waves.   EKG lead was unremarkable ( regular rhythm of 60 bpm) .   Impression: Normal EEG .   Hyperventilation and photic stimulation did not elicit any abnormalities.  There were no epileptiform discharges or electrographic seizures seen.     Clinical Correlation: A normal EEG does not exclude a clinical diagnosis of epilepsy.  If further clinical questions remain, prolonged EEG may be helpful.  Clinical correlation is advised.   Dr. Dedra Gores, M.D. Accredited by  the ABPN, ABSM.

## 2024-04-23 ENCOUNTER — Other Ambulatory Visit: Payer: Self-pay

## 2024-04-23 DIAGNOSIS — H269 Unspecified cataract: Secondary | ICD-10-CM | POA: Insufficient documentation

## 2024-04-23 NOTE — Progress Notes (Deleted)
 Cardiology Office Note:    Date:  04/23/2024   ID:  [name], DOB 20-May-1966, MRN [id number]  PCP:  Sebastian Beverley NOVAK, MD  Cardiologist:  Redell Leiter, MD    Referring MD: Sebastian Beverley NOVAK, MD    ASSESSMENT:    1. Bilateral lower extremity edema   2. OSA (obstructive sleep apnea)   3. Morbid obesity (HCC)    PLAN:    In order of problems listed above:  ***   Next appointment: ***   Medication Adjustments/Labs and Tests Ordered: Current medicines are reviewed at length with the patient today.  Concerns regarding medicines are outlined above.  No orders of the defined types were placed in this encounter.  No orders of the defined types were placed in this encounter.    History of Present Illness:    Theresa Marquez is a 58 y.o. female with a hx of lower extremity edema and shortness of breath in the context of high-dose steroid therapy obstructive sleep apnea history of cigarette smoking quite heavy and probable COPD last seen 06/06/2024. Beltre edema was not a reflection of heart failure but was in the context of lung disease gabapentin  therapy and obesity with a BMI of 48.  He was subsequently seen by Delon Hoover nurse practitioner for episodes of syncope and dizziness.  She had an echocardiogram repeated 03/21/2024 left ventricle normal in size wall thickness normal ejection fraction GLS and normal diastolic filling right ventricle was also normal. She had a cardiac CTA performed 03/20/2024 with his calcium  score 155 95th percentile and moderate stenosis 50 to 69% of the small obtuse marginal branch. She had an event monitor initiated 02/28/2023 rhythm was sinus throughout 4 symptomatic events were all sinus without arrhythmia there is no bradycardia both ventricular and supraventricular ectopy were rare and there were brief runs of atrial premature contractions. Compliance with diet, lifestyle and medications: *** Past Medical History:  Diagnosis Date    Abdominal bloating 07/15/2023   AKI (acute kidney injury) 12/14/2021   Allergy    Aortic atherosclerosis 08/15/2023   Bilateral lower extremity edema 11/10/2021   Breast asymmetry in left breast on screeing mammogram 08/18/2023   Cataract    Cervicalgia 08/24/2022   Chronic bilateral low back pain with bilateral sciatica 02/25/2022   Chronic cough 10/13/2023   Chronic pain    Class 3 severe obesity due to excess calories with serious comorbidity and body mass index (BMI) of 45.0 to 49.9 in adult [facility]) 07/15/2023   Closed fracture of tooth 03/01/2023   Colon, diverticulosis 08/15/2023   COPD (chronic obstructive pulmonary disease) (HCC)    Coronary artery disease    Coronary artery disease involving native coronary artery of native heart without angina pectoris 08/15/2023   Depression, major, single episode, moderate (HCC) 07/15/2023   Diabetes mellitus without complication (HCC)    Drug side effects 11/14/2023   Family history of thyroid  disease 10/05/2021   Fatigue 12/24/2014   Frequent falls 03/01/2023   Gastroesophageal reflux disease 11/14/2023   Hepatomegaly 08/15/2023   Hoarseness 10/13/2023   Lumbar foraminal stenosis 03/01/2023   Lymphadenopathy 07/15/2023   Mass of upper outer quadrant of left breast 09/09/2023   Metabolic dysfunction-associated steatotic liver disease (MASLD) 08/15/2023   Myalgia due to statin 07/14/2023   Nausea 10/13/2023   Neck swelling 10/05/2021   Neuralgia 12/10/2022   Obesity, morbid, BMI 40.0-49.9 (HCC) 11/10/2021   OSA (obstructive sleep apnea) 12/14/2021   Pain in thoracic spine 06/07/2023   Paresthesia  06/07/2023   Positive colorectal cancer screening using Cologuard test 07/15/2023   Pruritic dermatitis 08/24/2022   Pulmonary artery hypertension (HCC) 08/15/2023   Rash of both hands 12/10/2022   Recurrent sinus infections 11/10/2021   Rhinitis 11/30/2021   Sedimentation rate elevation 03/24/2023   Seizure-like activity (HCC)  11/14/2022   Simple chronic bronchitis (HCC) 08/16/2023   Sleep apnea    Sleep disturbance 08/24/2022   Small airways disease 08/15/2023   Small vessel disease 03/01/2023   Syncope 03/01/2023   Tobacco abuse 11/10/2021   Tonsillitis and adenoiditis, chronic 08/15/2023   Type 2 diabetes mellitus without complication, with long-term current use of insulin (HCC) 08/15/2023   Umbilical hernia without obstruction and without gangrene 08/15/2023   Vasomotor symptoms due to menopause 11/14/2023   Visual disturbances 11/14/2023   Weakness of extremity 11/14/2022   Wheezing 12/14/2021    Current Medications: No outpatient medications have been marked as taking for the 04/24/24 encounter (Appointment) with Monetta Redell PARAS, MD.      EKGs/Labs/Other Studies Reviewed:    The following studies were reviewed today:  Cardiac Studies & Procedures   ______________________________________________________________________________________________     ECHOCARDIOGRAM  ECHOCARDIOGRAM COMPLETE 03/21/2024  Narrative ECHOCARDIOGRAM REPORT    Patient Name:   Theresa Marquez Date of Exam: 03/21/2024 Medical Rec #:  [id number]       Height:       64.0 in Accession #:    [id number]      Weight:       241.0 lb Date of Birth:  Mar 11, 1966       BSA:          2.118 m Patient Age:    58 years        BP:           126/77 mmHg Patient Gender: F               HR:           87 bpm. Exam Location:  [place]  Procedure: 2D Echo, Cardiac Doppler, Color Doppler and Strain Analysis (Both Spectral and Color Flow Doppler were utilized during procedure).  Indications:    R06.9 DOE  History:        Patient has prior history of Echocardiogram examinations, most recent 12/02/2021. CHF, CAD, COPD and Pulmonary HTN, Signs/Symptoms:Dyspnea; Risk Factors:Current Smoker, Hypertension, Diabetes, Dyslipidemia and Sleep Apnea.  Sonographer:    Alan Greenhouse RDMS, RVT, RDCS Referring Phys: [phone] DELON JAYSON HOOVER   Sonographer Comments: Suboptimal parasternal window. Image acquisition challenging due to patient body habitus. IMPRESSIONS   1. Left ventricular ejection fraction, by estimation, is 60 to 65%. Left ventricular ejection fraction by 2D MOD biplane is 58.2 %. The left ventricle has normal function. The left ventricle has no regional wall motion abnormalities. Left ventricular diastolic parameters are consistent with Grade I diastolic dysfunction (impaired relaxation). The average left ventricular global longitudinal strain is -21.7 %. The global longitudinal strain is normal. 2. Right ventricular systolic function is normal. The right ventricular size is normal. 3. The mitral valve is normal in structure. No evidence of mitral valve regurgitation. No evidence of mitral stenosis. 4. The aortic valve is normal in structure. Aortic valve regurgitation is not visualized. No aortic stenosis is present. 5. The inferior vena cava is normal in size with greater than 50% respiratory variability, suggesting right atrial pressure of 3 mmHg.  Comparison(s): Echocardiogram done 12/02/21 showed an EF of 55-60%.  FINDINGS Left Ventricle: Left ventricular  ejection fraction, by estimation, is 60 to 65%. Left ventricular ejection fraction by 2D MOD biplane is 58.2 %. The left ventricle has normal function. The left ventricle has no regional wall motion abnormalities. The average left ventricular global longitudinal strain is -21.7 %. Strain was performed and the global longitudinal strain is normal. The left ventricular internal cavity size was normal in size. There is no left ventricular hypertrophy. Left ventricular diastolic parameters are consistent with Grade I diastolic dysfunction (impaired relaxation). Normal left ventricular filling pressure.  Right Ventricle: The right ventricular size is normal. No increase in right ventricular wall thickness. Right ventricular systolic function is normal.  Left  Atrium: Left atrial size was normal in size.  Right Atrium: Right atrial size was normal in size.  Pericardium: There is no evidence of pericardial effusion.  Mitral Valve: The mitral valve is normal in structure. No evidence of mitral valve regurgitation. No evidence of mitral valve stenosis.  Tricuspid Valve: The tricuspid valve is normal in structure. Tricuspid valve regurgitation is not demonstrated. No evidence of tricuspid stenosis.  Aortic Valve: The aortic valve is normal in structure. Aortic valve regurgitation is not visualized. No aortic stenosis is present. Aortic valve mean gradient measures 7.0 mmHg. Aortic valve peak gradient measures 12.2 mmHg. Aortic valve area, by VTI measures 2.19 cm.  Pulmonic Valve: The pulmonic valve was not well visualized. Pulmonic valve regurgitation is not visualized. No evidence of pulmonic stenosis.  Aorta: The aortic root is normal in size and structure, the aortic arch was not well visualized and the ascending aorta was not well visualized.  Venous: The inferior vena cava is normal in size with greater than 50% respiratory variability, suggesting right atrial pressure of 3 mmHg.  IAS/Shunts: No atrial level shunt detected by color flow Doppler.   LEFT VENTRICLE PLAX 2D                        Biplane EF (MOD) LVIDd:         4.90 cm         LV Biplane EF:   Left LVIDs:         3.10 cm                          ventricular LV PW:         1.00 cm                          ejection LV IVS:        0.90 cm                          fraction by LVOT diam:     2.10 cm                          2D MOD LV SV:         77                               biplane is LV SV Index:   36                               58.2 %. LVOT Area:     3.46  cm Diastology LV e' medial:    5.77 cm/s LV Volumes (MOD)               LV E/e' medial:  13.2 LV vol d, MOD    67.3 ml       LV e' lateral:   7.51 cm/s A2C:                           LV E/e' lateral: 10.1 LV vol d,  MOD    53.7 ml A4C:                           2D Longitudinal LV vol s, MOD    27.1 ml       Strain A2C:                           2D Strain GLS   -20.2 % LV vol s, MOD    23.9 ml       (A4C): A4C:                           2D Strain GLS   -23.7 % LV SV MOD A2C:   40.2 ml       (A3C): LV SV MOD A4C:   53.7 ml       2D Strain GLS   -21.3 % LV SV MOD BP:    35.4 ml       (A2C): 2D Strain GLS   -21.7 % Avg:  RIGHT VENTRICLE RV S prime:     12.80 cm/s TAPSE (M-mode): 2.6 cm  LEFT ATRIUM             Index        RIGHT ATRIUM           Index LA diam:        2.70 cm 1.27 cm/m   RA Area:     10.40 cm LA Vol (A2C):   15.6 ml 7.37 ml/m   RA Volume:   22.60 ml  10.67 ml/m LA Vol (A4C):   31.0 ml 14.64 ml/m LA Biplane Vol: 23.7 ml 11.19 ml/m AORTIC VALVE AV Area (Vmax):    2.22 cm AV Area (Vmean):   2.02 cm AV Area (VTI):     2.19 cm AV Vmax:           175.00 cm/s AV Vmean:          127.000 cm/s AV VTI:            0.352 m AV Peak Grad:      12.2 mmHg AV Mean Grad:      7.0 mmHg LVOT Vmax:         112.00 cm/s LVOT Vmean:        74.100 cm/s LVOT VTI:          0.223 m LVOT/AV VTI ratio: 0.63  AORTA Ao Root diam: 3.20 cm  MITRAL VALVE MV Area (PHT): 2.91 cm    SHUNTS MV Decel Time: 261 msec    Systemic VTI:  0.22 m MR Peak grad: 21.3 mmHg    Systemic Diam: 2.10 cm MR Vmax:      231.00 cm/s MV E velocity: 76.00 cm/s MV A velocity: 90.70 cm/s MV E/A ratio:  0.84  Redell Leiter MD Electronically signed by Redell Leiter  MD Signature Date/Time: 03/21/2024/6:30:53 PM    Final    MONITORS  LONG TERM MONITOR-LIVE TELEMETRY (3-14 DAYS) 03/20/2024  Narrative Patch Wear Time:  11 days and 17 hours (2025-09-02T10:17:43-0400 to 2025-09-14T03:23:53-0400)  Patient had a min HR of 47 bpm, max HR of 167 bpm, and avg HR of 90 bpm. Predominant underlying rhythm was Sinus Rhythm. First Degree AV Block was present.  There were 4 symptomatic events all sinus rhythm and sinus  tachycardia without ectopy.  There were no pauses 3 seconds or greater no episodes of second or third-degree AV nodal block.  12 brief runs of APCs/atrial tachycardia  occurred, the run with the fastest interval lasting 6 beats with a max rate of 167 bpm, the longest lasting 9.3 secs with an avg rate of 151 bpm.  There were no episodes of atrial fibrillation or flutter.  Isolated SVEs were rare (<1.0%), SVE Couplets were rare (<1.0%), and SVE Triplets were rare (<1.0%).  Isolated VEs were rare (<1.0%), and no VE Couplets or VE Triplets were present.   CT SCANS  CT CORONARY MORPH W/CTA COR W/SCORE 03/20/2024  Addendum 03/25/2024  8:19 PM ADDENDUM REPORT: 03/25/2024 20:16  EXAM: OVER-READ INTERPRETATION  CT CHEST  The following report is an over-read performed by radiologist Dr. Suzen Dials of [facility] on 03/25/2024. This over-read does not include interpretation of cardiac or coronary anatomy or pathology. The coronary calcium  score/coronary CTA interpretation by the cardiologist is attached.  COMPARISON:  July 29, 2023  FINDINGS: Cardiovascular: There are no significant extracardiac vascular findings.  Mediastinum/Nodes: There are no enlarged lymph nodes within the visualized mediastinum.  Lungs/Pleura: There is no pleural effusion. The visualized lungs appear clear.  Upper abdomen: No significant findings in the visualized upper abdomen.  Musculoskeletal/Chest wall: No chest wall mass or suspicious osseous findings within the visualized chest.  IMPRESSION: No significant extracardiac findings within the visualized chest.   Electronically Signed By: Suzen Dials M.D. On: 03/25/2024 20:16  Narrative CLINICAL DATA:  CP  EXAM: Cardiac/Coronary  CTA  TECHNIQUE: The patient was scanned on a GE Apex scanner.  FINDINGS: A 120 kV prospective scan was triggered in the descending thoracic aorta at 111 HU's. Axial non-contrast 3 mm slices  were carried out through the heart. The data set was analyzed on a dedicated work station and scored using the Agatson method. Gantry rotation speed was 250 msecs and collimation was .6 mm. No beta blockade and 0.8 mg of sl NTG was given. The 3D data set was reconstructed at 47% of the R-R cycle. Diastolic phases were analyzed on a dedicated work station using MPR, MIP and VRT modes. The patient received 80 cc of contrast.  Aorta: Normal size.  No calcifications.  No dissection.  Aortic Valve:  Trileaflet.  No calcifications.  Coronary Arteries:  Normal coronary origin.  Right dominance.  RCA is a large dominant artery that gives rise to PDA and PLA. There is no plaque.  Left main is a large artery that gives rise to LAD and LCX arteries.  LAD is a large vessel that has few small plaques in its proximal and mid portion with mild stenosis of 25-49%. This artery gives rise to small D1 and large D2, D3.  LCX is a non-dominant artery that gives rise to one small OM1 and large OM2 branches. There is soft plaque in the proximal portion of the small OM1 with moderate stenosis of 50-69%.  Other findings:  Normal pulmonary vein drainage into the  left atrium.  Normal left atrial appendage without a thrombus.  Normal size of the pulmonary artery.  IMPRESSION: 1. Coronary calcium  score of 155. This was 95 percentile for age and sex matched control.  2. Normal coronary origin with right dominance.  3. CAD-RADS 3. Moderate stenosis. (50-69%) small OM1. Consider symptom-guided anti-ischemic pharmacotherapy as well as risk factor modification per guideline directed care.  Electronically Signed: By: Lamar Fitch M.D. On: 03/21/2024 16:58     ______________________________________________________________________________________________          Recent Labs: 07/14/2023: TSH 2.07 12/21/2023: ALT 24; Hemoglobin 14.6; Platelets 424.0 02/28/2024: BUN 12; Creatinine, Ser 0.92;  Potassium 4.7; Sodium 136  Recent Lipid Panel    Component Value Date/Time   CHOL 223 (H) 07/14/2023 1436   TRIG 208.0 (H) 07/14/2023 1436   HDL 32.50 (L) 07/14/2023 1436   CHOLHDL 7 07/14/2023 1436   VLDL 41.6 (H) 07/14/2023 1436   LDLCALC 148 (H) 07/14/2023 1436    Physical Exam:    VS:  LMP  (LMP Unknown)     Wt Readings from Last 3 Encounters:  03/16/24 241 lb (109.3 kg)  02/28/24 239 lb 3.2 oz (108.5 kg)  02/15/24 243 lb 3.2 oz (110.3 kg)     GEN: *** Well nourished, well developed in no acute distress HEENT: Normal NECK: No JVD; No carotid bruits LYMPHATICS: No lymphadenopathy CARDIAC: ***RRR, no murmurs, rubs, gallops RESPIRATORY:  Clear to auscultation without rales, wheezing or rhonchi  ABDOMEN: Soft, non-tender, non-distended MUSCULOSKELETAL:  No edema; No deformity  SKIN: Warm and dry NEUROLOGIC:  Alert and oriented x 3 PSYCHIATRIC:  Normal affect    Signed, Redell Leiter, MD  04/23/2024 9:04 PM    [facility]

## 2024-04-24 ENCOUNTER — Ambulatory Visit: Admitting: Cardiology

## 2024-04-24 ENCOUNTER — Telehealth: Payer: Self-pay | Admitting: Cardiology

## 2024-04-24 DIAGNOSIS — G4733 Obstructive sleep apnea (adult) (pediatric): Secondary | ICD-10-CM

## 2024-04-24 DIAGNOSIS — R600 Localized edema: Secondary | ICD-10-CM

## 2024-04-24 NOTE — Telephone Encounter (Signed)
 Patient woke up sick this morning and she thinks it may be the flu. She doesn't want to miss her appt and wants to know if it can be changed to a tele-visit. If not she will have to r/s

## 2024-04-25 ENCOUNTER — Encounter

## 2024-04-25 ENCOUNTER — Other Ambulatory Visit: Payer: Self-pay | Admitting: Family Medicine

## 2024-04-25 DIAGNOSIS — R140 Abdominal distension (gaseous): Secondary | ICD-10-CM

## 2024-05-02 NOTE — Progress Notes (Unsigned)
 Cardiology Office Note:    Date:  04/23/2024   ID:  [name], DOB 20-May-1966, MRN [id number]  PCP:  Sebastian Beverley NOVAK, MD  Cardiologist:  Redell Leiter, MD    Referring MD: Sebastian Beverley NOVAK, MD    ASSESSMENT:    1. Bilateral lower extremity edema   2. OSA (obstructive sleep apnea)   3. Morbid obesity (HCC)    PLAN:    In order of problems listed above:  ***   Next appointment: ***   Medication Adjustments/Labs and Tests Ordered: Current medicines are reviewed at length with the patient today.  Concerns regarding medicines are outlined above.  No orders of the defined types were placed in this encounter.  No orders of the defined types were placed in this encounter.    History of Present Illness:    Theresa Marquez is a 58 y.o. female with a hx of lower extremity edema and shortness of breath in the context of high-dose steroid therapy obstructive sleep apnea history of cigarette smoking quite heavy and probable COPD last seen 06/06/2024. Beltre edema was not a reflection of heart failure but was in the context of lung disease gabapentin  therapy and obesity with a BMI of 48.  He was subsequently seen by Delon Hoover nurse practitioner for episodes of syncope and dizziness.  She had an echocardiogram repeated 03/21/2024 left ventricle normal in size wall thickness normal ejection fraction GLS and normal diastolic filling right ventricle was also normal. She had a cardiac CTA performed 03/20/2024 with his calcium  score 155 95th percentile and moderate stenosis 50 to 69% of the small obtuse marginal branch. She had an event monitor initiated 02/28/2023 rhythm was sinus throughout 4 symptomatic events were all sinus without arrhythmia there is no bradycardia both ventricular and supraventricular ectopy were rare and there were brief runs of atrial premature contractions. Compliance with diet, lifestyle and medications: *** Past Medical History:  Diagnosis Date    Abdominal bloating 07/15/2023   AKI (acute kidney injury) 12/14/2021   Allergy    Aortic atherosclerosis 08/15/2023   Bilateral lower extremity edema 11/10/2021   Breast asymmetry in left breast on screeing mammogram 08/18/2023   Cataract    Cervicalgia 08/24/2022   Chronic bilateral low back pain with bilateral sciatica 02/25/2022   Chronic cough 10/13/2023   Chronic pain    Class 3 severe obesity due to excess calories with serious comorbidity and body mass index (BMI) of 45.0 to 49.9 in adult [facility]) 07/15/2023   Closed fracture of tooth 03/01/2023   Colon, diverticulosis 08/15/2023   COPD (chronic obstructive pulmonary disease) (HCC)    Coronary artery disease    Coronary artery disease involving native coronary artery of native heart without angina pectoris 08/15/2023   Depression, major, single episode, moderate (HCC) 07/15/2023   Diabetes mellitus without complication (HCC)    Drug side effects 11/14/2023   Family history of thyroid  disease 10/05/2021   Fatigue 12/24/2014   Frequent falls 03/01/2023   Gastroesophageal reflux disease 11/14/2023   Hepatomegaly 08/15/2023   Hoarseness 10/13/2023   Lumbar foraminal stenosis 03/01/2023   Lymphadenopathy 07/15/2023   Mass of upper outer quadrant of left breast 09/09/2023   Metabolic dysfunction-associated steatotic liver disease (MASLD) 08/15/2023   Myalgia due to statin 07/14/2023   Nausea 10/13/2023   Neck swelling 10/05/2021   Neuralgia 12/10/2022   Obesity, morbid, BMI 40.0-49.9 (HCC) 11/10/2021   OSA (obstructive sleep apnea) 12/14/2021   Pain in thoracic spine 06/07/2023   Paresthesia  06/07/2023   Positive colorectal cancer screening using Cologuard test 07/15/2023   Pruritic dermatitis 08/24/2022   Pulmonary artery hypertension (HCC) 08/15/2023   Rash of both hands 12/10/2022   Recurrent sinus infections 11/10/2021   Rhinitis 11/30/2021   Sedimentation rate elevation 03/24/2023   Seizure-like activity (HCC)  11/14/2022   Simple chronic bronchitis (HCC) 08/16/2023   Sleep apnea    Sleep disturbance 08/24/2022   Small airways disease 08/15/2023   Small vessel disease 03/01/2023   Syncope 03/01/2023   Tobacco abuse 11/10/2021   Tonsillitis and adenoiditis, chronic 08/15/2023   Type 2 diabetes mellitus without complication, with long-term current use of insulin (HCC) 08/15/2023   Umbilical hernia without obstruction and without gangrene 08/15/2023   Vasomotor symptoms due to menopause 11/14/2023   Visual disturbances 11/14/2023   Weakness of extremity 11/14/2022   Wheezing 12/14/2021    Current Medications: No outpatient medications have been marked as taking for the 04/24/24 encounter (Appointment) with Monetta Redell PARAS, MD.      EKGs/Labs/Other Studies Reviewed:    The following studies were reviewed today:  Cardiac Studies & Procedures   ______________________________________________________________________________________________     ECHOCARDIOGRAM  ECHOCARDIOGRAM COMPLETE 03/21/2024  Narrative ECHOCARDIOGRAM REPORT    Patient Name:   Theresa Marquez Date of Exam: 03/21/2024 Medical Rec #:  [id number]       Height:       64.0 in Accession #:    [id number]      Weight:       241.0 lb Date of Birth:  Mar 11, 1966       BSA:          2.118 m Patient Age:    58 years        BP:           126/77 mmHg Patient Gender: F               HR:           87 bpm. Exam Location:  [place]  Procedure: 2D Echo, Cardiac Doppler, Color Doppler and Strain Analysis (Both Spectral and Color Flow Doppler were utilized during procedure).  Indications:    R06.9 DOE  History:        Patient has prior history of Echocardiogram examinations, most recent 12/02/2021. CHF, CAD, COPD and Pulmonary HTN, Signs/Symptoms:Dyspnea; Risk Factors:Current Smoker, Hypertension, Diabetes, Dyslipidemia and Sleep Apnea.  Sonographer:    Alan Greenhouse RDMS, RVT, RDCS Referring Phys: [phone] DELON JAYSON HOOVER   Sonographer Comments: Suboptimal parasternal window. Image acquisition challenging due to patient body habitus. IMPRESSIONS   1. Left ventricular ejection fraction, by estimation, is 60 to 65%. Left ventricular ejection fraction by 2D MOD biplane is 58.2 %. The left ventricle has normal function. The left ventricle has no regional wall motion abnormalities. Left ventricular diastolic parameters are consistent with Grade I diastolic dysfunction (impaired relaxation). The average left ventricular global longitudinal strain is -21.7 %. The global longitudinal strain is normal. 2. Right ventricular systolic function is normal. The right ventricular size is normal. 3. The mitral valve is normal in structure. No evidence of mitral valve regurgitation. No evidence of mitral stenosis. 4. The aortic valve is normal in structure. Aortic valve regurgitation is not visualized. No aortic stenosis is present. 5. The inferior vena cava is normal in size with greater than 50% respiratory variability, suggesting right atrial pressure of 3 mmHg.  Comparison(s): Echocardiogram done 12/02/21 showed an EF of 55-60%.  FINDINGS Left Ventricle: Left ventricular  ejection fraction, by estimation, is 60 to 65%. Left ventricular ejection fraction by 2D MOD biplane is 58.2 %. The left ventricle has normal function. The left ventricle has no regional wall motion abnormalities. The average left ventricular global longitudinal strain is -21.7 %. Strain was performed and the global longitudinal strain is normal. The left ventricular internal cavity size was normal in size. There is no left ventricular hypertrophy. Left ventricular diastolic parameters are consistent with Grade I diastolic dysfunction (impaired relaxation). Normal left ventricular filling pressure.  Right Ventricle: The right ventricular size is normal. No increase in right ventricular wall thickness. Right ventricular systolic function is normal.  Left  Atrium: Left atrial size was normal in size.  Right Atrium: Right atrial size was normal in size.  Pericardium: There is no evidence of pericardial effusion.  Mitral Valve: The mitral valve is normal in structure. No evidence of mitral valve regurgitation. No evidence of mitral valve stenosis.  Tricuspid Valve: The tricuspid valve is normal in structure. Tricuspid valve regurgitation is not demonstrated. No evidence of tricuspid stenosis.  Aortic Valve: The aortic valve is normal in structure. Aortic valve regurgitation is not visualized. No aortic stenosis is present. Aortic valve mean gradient measures 7.0 mmHg. Aortic valve peak gradient measures 12.2 mmHg. Aortic valve area, by VTI measures 2.19 cm.  Pulmonic Valve: The pulmonic valve was not well visualized. Pulmonic valve regurgitation is not visualized. No evidence of pulmonic stenosis.  Aorta: The aortic root is normal in size and structure, the aortic arch was not well visualized and the ascending aorta was not well visualized.  Venous: The inferior vena cava is normal in size with greater than 50% respiratory variability, suggesting right atrial pressure of 3 mmHg.  IAS/Shunts: No atrial level shunt detected by color flow Doppler.   LEFT VENTRICLE PLAX 2D                        Biplane EF (MOD) LVIDd:         4.90 cm         LV Biplane EF:   Left LVIDs:         3.10 cm                          ventricular LV PW:         1.00 cm                          ejection LV IVS:        0.90 cm                          fraction by LVOT diam:     2.10 cm                          2D MOD LV SV:         77                               biplane is LV SV Index:   36                               58.2 %. LVOT Area:     3.46  cm Diastology LV e' medial:    5.77 cm/s LV Volumes (MOD)               LV E/e' medial:  13.2 LV vol d, MOD    67.3 ml       LV e' lateral:   7.51 cm/s A2C:                           LV E/e' lateral: 10.1 LV vol d,  MOD    53.7 ml A4C:                           2D Longitudinal LV vol s, MOD    27.1 ml       Strain A2C:                           2D Strain GLS   -20.2 % LV vol s, MOD    23.9 ml       (A4C): A4C:                           2D Strain GLS   -23.7 % LV SV MOD A2C:   40.2 ml       (A3C): LV SV MOD A4C:   53.7 ml       2D Strain GLS   -21.3 % LV SV MOD BP:    35.4 ml       (A2C): 2D Strain GLS   -21.7 % Avg:  RIGHT VENTRICLE RV S prime:     12.80 cm/s TAPSE (M-mode): 2.6 cm  LEFT ATRIUM             Index        RIGHT ATRIUM           Index LA diam:        2.70 cm 1.27 cm/m   RA Area:     10.40 cm LA Vol (A2C):   15.6 ml 7.37 ml/m   RA Volume:   22.60 ml  10.67 ml/m LA Vol (A4C):   31.0 ml 14.64 ml/m LA Biplane Vol: 23.7 ml 11.19 ml/m AORTIC VALVE AV Area (Vmax):    2.22 cm AV Area (Vmean):   2.02 cm AV Area (VTI):     2.19 cm AV Vmax:           175.00 cm/s AV Vmean:          127.000 cm/s AV VTI:            0.352 m AV Peak Grad:      12.2 mmHg AV Mean Grad:      7.0 mmHg LVOT Vmax:         112.00 cm/s LVOT Vmean:        74.100 cm/s LVOT VTI:          0.223 m LVOT/AV VTI ratio: 0.63  AORTA Ao Root diam: 3.20 cm  MITRAL VALVE MV Area (PHT): 2.91 cm    SHUNTS MV Decel Time: 261 msec    Systemic VTI:  0.22 m MR Peak grad: 21.3 mmHg    Systemic Diam: 2.10 cm MR Vmax:      231.00 cm/s MV E velocity: 76.00 cm/s MV A velocity: 90.70 cm/s MV E/A ratio:  0.84  Redell Leiter MD Electronically signed by Redell Leiter  MD Signature Date/Time: 03/21/2024/6:30:53 PM    Final    MONITORS  LONG TERM MONITOR-LIVE TELEMETRY (3-14 DAYS) 03/20/2024  Narrative Patch Wear Time:  11 days and 17 hours (2025-09-02T10:17:43-0400 to 2025-09-14T03:23:53-0400)  Patient had a min HR of 47 bpm, max HR of 167 bpm, and avg HR of 90 bpm. Predominant underlying rhythm was Sinus Rhythm. First Degree AV Block was present.  There were 4 symptomatic events all sinus rhythm and sinus  tachycardia without ectopy.  There were no pauses 3 seconds or greater no episodes of second or third-degree AV nodal block.  12 brief runs of APCs/atrial tachycardia  occurred, the run with the fastest interval lasting 6 beats with a max rate of 167 bpm, the longest lasting 9.3 secs with an avg rate of 151 bpm.  There were no episodes of atrial fibrillation or flutter.  Isolated SVEs were rare (<1.0%), SVE Couplets were rare (<1.0%), and SVE Triplets were rare (<1.0%).  Isolated VEs were rare (<1.0%), and no VE Couplets or VE Triplets were present.   CT SCANS  CT CORONARY MORPH W/CTA COR W/SCORE 03/20/2024  Addendum 03/25/2024  8:19 PM ADDENDUM REPORT: 03/25/2024 20:16  EXAM: OVER-READ INTERPRETATION  CT CHEST  The following report is an over-read performed by radiologist Dr. Suzen Dials of [facility] on 03/25/2024. This over-read does not include interpretation of cardiac or coronary anatomy or pathology. The coronary calcium  score/coronary CTA interpretation by the cardiologist is attached.  COMPARISON:  July 29, 2023  FINDINGS: Cardiovascular: There are no significant extracardiac vascular findings.  Mediastinum/Nodes: There are no enlarged lymph nodes within the visualized mediastinum.  Lungs/Pleura: There is no pleural effusion. The visualized lungs appear clear.  Upper abdomen: No significant findings in the visualized upper abdomen.  Musculoskeletal/Chest wall: No chest wall mass or suspicious osseous findings within the visualized chest.  IMPRESSION: No significant extracardiac findings within the visualized chest.   Electronically Signed By: Suzen Dials M.D. On: 03/25/2024 20:16  Narrative CLINICAL DATA:  CP  EXAM: Cardiac/Coronary  CTA  TECHNIQUE: The patient was scanned on a GE Apex scanner.  FINDINGS: A 120 kV prospective scan was triggered in the descending thoracic aorta at 111 HU's. Axial non-contrast 3 mm slices  were carried out through the heart. The data set was analyzed on a dedicated work station and scored using the Agatson method. Gantry rotation speed was 250 msecs and collimation was .6 mm. No beta blockade and 0.8 mg of sl NTG was given. The 3D data set was reconstructed at 47% of the R-R cycle. Diastolic phases were analyzed on a dedicated work station using MPR, MIP and VRT modes. The patient received 80 cc of contrast.  Aorta: Normal size.  No calcifications.  No dissection.  Aortic Valve:  Trileaflet.  No calcifications.  Coronary Arteries:  Normal coronary origin.  Right dominance.  RCA is a large dominant artery that gives rise to PDA and PLA. There is no plaque.  Left main is a large artery that gives rise to LAD and LCX arteries.  LAD is a large vessel that has few small plaques in its proximal and mid portion with mild stenosis of 25-49%. This artery gives rise to small D1 and large D2, D3.  LCX is a non-dominant artery that gives rise to one small OM1 and large OM2 branches. There is soft plaque in the proximal portion of the small OM1 with moderate stenosis of 50-69%.  Other findings:  Normal pulmonary vein drainage into the  left atrium.  Normal left atrial appendage without a thrombus.  Normal size of the pulmonary artery.  IMPRESSION: 1. Coronary calcium  score of 155. This was 95 percentile for age and sex matched control.  2. Normal coronary origin with right dominance.  3. CAD-RADS 3. Moderate stenosis. (50-69%) small OM1. Consider symptom-guided anti-ischemic pharmacotherapy as well as risk factor modification per guideline directed care.  Electronically Signed: By: Lamar Fitch M.D. On: 03/21/2024 16:58     ______________________________________________________________________________________________          Recent Labs: 07/14/2023: TSH 2.07 12/21/2023: ALT 24; Hemoglobin 14.6; Platelets 424.0 02/28/2024: BUN 12; Creatinine, Ser 0.92;  Potassium 4.7; Sodium 136  Recent Lipid Panel    Component Value Date/Time   CHOL 223 (H) 07/14/2023 1436   TRIG 208.0 (H) 07/14/2023 1436   HDL 32.50 (L) 07/14/2023 1436   CHOLHDL 7 07/14/2023 1436   VLDL 41.6 (H) 07/14/2023 1436   LDLCALC 148 (H) 07/14/2023 1436    Physical Exam:    VS:  LMP  (LMP Unknown)     Wt Readings from Last 3 Encounters:  03/16/24 241 lb (109.3 kg)  02/28/24 239 lb 3.2 oz (108.5 kg)  02/15/24 243 lb 3.2 oz (110.3 kg)     GEN: *** Well nourished, well developed in no acute distress HEENT: Normal NECK: No JVD; No carotid bruits LYMPHATICS: No lymphadenopathy CARDIAC: ***RRR, no murmurs, rubs, gallops RESPIRATORY:  Clear to auscultation without rales, wheezing or rhonchi  ABDOMEN: Soft, non-tender, non-distended MUSCULOSKELETAL:  No edema; No deformity  SKIN: Warm and dry NEUROLOGIC:  Alert and oriented x 3 PSYCHIATRIC:  Normal affect    Signed, Redell Leiter, MD  04/23/2024 9:04 PM    [facility]

## 2024-05-03 ENCOUNTER — Ambulatory Visit: Attending: Cardiology | Admitting: Cardiology

## 2024-05-03 ENCOUNTER — Encounter: Payer: Self-pay | Admitting: Cardiology

## 2024-05-03 VITALS — BP 122/78 | HR 85 | Ht 64.0 in | Wt 236.4 lb

## 2024-05-03 DIAGNOSIS — E785 Hyperlipidemia, unspecified: Secondary | ICD-10-CM | POA: Insufficient documentation

## 2024-05-03 DIAGNOSIS — R931 Abnormal findings on diagnostic imaging of heart and coronary circulation: Secondary | ICD-10-CM | POA: Diagnosis not present

## 2024-05-03 DIAGNOSIS — I25118 Atherosclerotic heart disease of native coronary artery with other forms of angina pectoris: Secondary | ICD-10-CM | POA: Insufficient documentation

## 2024-05-03 MED ORDER — ASPIRIN 81 MG PO TBEC: 81.0000 mg | DELAYED_RELEASE_TABLET | Freq: Every day | ORAL | 3 refills | Status: AC

## 2024-05-03 NOTE — Progress Notes (Signed)
 Cardiology Office Note:    Date:  05/03/2024   ID:  Theresa Marquez, DOB 10/06/1965, MRN [id number]  PCP:  Sebastian Beverley NOVAK, MD  Cardiologist:  Redell Leiter, MD    Referring MD: Sebastian Beverley NOVAK, MD    ASSESSMENT:    1. Coronary artery disease of native artery of native heart with stable angina pectoris   2. Agatston coronary artery calcium  score less than 100   3. Dyslipidemia    PLAN:    In order of problems listed above:  She has mild CAD not having anginal symptoms is not responsible for her symptoms of profound fatigue weakness hypersomnia and loss of consciousness.  Will continue medical therapy add aspirin 81 g daily continue her statin. Her symptoms of loss of consciousness strongly suggestive of hypoglycemia I told her to talk to her PCP about a continuous monitor   Next appointment: I will plan to see her in 1 year   Medication Adjustments/Labs and Tests Ordered: Current medicines are reviewed at length with the patient today.  Concerns regarding medicines are outlined above.  Orders Placed This Encounter  Procedures   Lipid Profile   Apolipoprotein B   Meds ordered this encounter  Medications   aspirin EC 81 MG tablet    Sig: Take 1 tablet (81 mg total) by mouth daily. Swallow whole.    Dispense:  90 tablet    Refill:  3     History of Present Illness:    Theresa Marquez is a 58 y.o. female with a hx of lower extremity edema and shortness of breath in the context of high-dose steroid therapy obstructive sleep apnea history of cigarette smoking quite heavy and probable COPD last seen 06/06/2024.   Her edema was not a reflection of heart failure but was in the context of lung disease gabapentin  therapy and obesity with a BMI of 48.  She was subsequently seen by Delon Hoover nurse practitioner for episodes of syncope and dizziness.   She had an echocardiogram repeated 03/21/2024 left ventricle normal in size wall thickness normal ejection fraction GLS and  normal diastolic filling right ventricle was also normal.   She had a cardiac CTA performed 03/20/2024 with his calcium  score 155 95th percentile and moderate stenosis 50 to 69% of the small obtuse marginal branch.   She had an event monitor initiated 02/28/2023 rhythm was sinus throughout 4 symptomatic events were all sinus without arrhythmia there is no bradycardia both ventricular and supraventricular ectopy were rare and there were brief runs of atrial premature contractions.   Compliance with diet, lifestyle and medications: Yes   Starting at the beginning all this began because she was having episodes of loss of consciousness and profound weakness and was pending colonoscopy She has been seen by neurology concerns for narcolepsy and severe obstructive sleep apnea Her calcium  score is elevated and she has moderate stenosis of a branch left circumflex coronary artery but has no angina Echocardiogram does not show findings of congestive heart failure Her monitor shows a few runs of APCs but certainly nothing to explain her ongoing severe symptoms and loss of consciousness He is diabetic uses semaglutide  and has an A1c that is quite low 5.7% she is not checking her blood sugar she could be hypoglycemic To optimize her CAD care show start aspirin 81 mg daily continue her statin but will check a lipid profile and APO B today Past Medical History:  Diagnosis Date   Abdominal bloating 07/15/2023  AKI (acute kidney injury) 12/14/2021   Allergy    Aortic atherosclerosis 08/15/2023   Bilateral lower extremity edema 11/10/2021   Breast asymmetry in left breast on screeing mammogram 08/18/2023   Cataract    Cervicalgia 08/24/2022   Chronic bilateral low back pain with bilateral sciatica 02/25/2022   Chronic cough 10/13/2023   Chronic pain    Class 3 severe obesity due to excess calories with serious comorbidity and body mass index (BMI) of 45.0 to 49.9 in adult [facility]) 07/15/2023   Closed  fracture of tooth 03/01/2023   Colon, diverticulosis 08/15/2023   COPD (chronic obstructive pulmonary disease) (HCC)    Coronary artery disease    Coronary artery disease involving native coronary artery of native heart without angina pectoris 08/15/2023   Depression, major, single episode, moderate (HCC) 07/15/2023   Diabetes mellitus without complication (HCC)    Drug side effects 11/14/2023   Family history of thyroid  disease 10/05/2021   Fatigue 12/24/2014   Frequent falls 03/01/2023   Gastroesophageal reflux disease 11/14/2023   Hepatomegaly 08/15/2023   Hoarseness 10/13/2023   Lumbar foraminal stenosis 03/01/2023   Lymphadenopathy 07/15/2023   Mass of upper outer quadrant of left breast 09/09/2023   Metabolic dysfunction-associated steatotic liver disease (MASLD) 08/15/2023   Myalgia due to statin 07/14/2023   Nausea 10/13/2023   Neck swelling 10/05/2021   Neuralgia 12/10/2022   Obesity, morbid, BMI 40.0-49.9 (HCC) 11/10/2021   OSA (obstructive sleep apnea) 12/14/2021   Pain in thoracic spine 06/07/2023   Paresthesia 06/07/2023   Positive colorectal cancer screening using Cologuard test 07/15/2023   Pruritic dermatitis 08/24/2022   Pulmonary artery hypertension (HCC) 08/15/2023   Rash of both hands 12/10/2022   Recurrent sinus infections 11/10/2021   Rhinitis 11/30/2021   Sedimentation rate elevation 03/24/2023   Seizure-like activity (HCC) 11/14/2022   Simple chronic bronchitis (HCC) 08/16/2023   Sleep apnea    Sleep disturbance 08/24/2022   Small airways disease 08/15/2023   Small vessel disease 03/01/2023   Syncope 03/01/2023   Tobacco abuse 11/10/2021   Tonsillitis and adenoiditis, chronic 08/15/2023   Type 2 diabetes mellitus without complication, with long-term current use of insulin (HCC) 08/15/2023   Umbilical hernia without obstruction and without gangrene 08/15/2023   Vasomotor symptoms due to menopause 11/14/2023   Visual disturbances 11/14/2023    Weakness of extremity 11/14/2022   Wheezing 12/14/2021    Current Medications: Current Meds  Medication Sig   aspirin EC 81 MG tablet Take 1 tablet (81 mg total) by mouth daily. Swallow whole.   azelastine  (ASTELIN ) 0.1 % nasal spray Place 2 sprays into both nostrils 2 (two) times daily. Use in each nostril as directed   celecoxib  (CELEBREX ) 200 MG capsule TAKE 1 CAPSULE BY MOUTH TWICE DAILY AS NEEDED FOR PAIN   esomeprazole  (NEXIUM ) 40 MG capsule TAKE 1 CAPSULE BY MOUTH DAILY   famotidine  (PEPCID ) 40 MG tablet Take 1 tablet (40 mg total) by mouth at bedtime.   furosemide  (LASIX ) 40 MG tablet Take 1 tablet (40 mg total) by mouth daily.   levocetirizine (XYZAL ) 5 MG tablet TAKE 1 TABLET BY MOUTH EVERY EVENING   mometasone  (NASONEX ) 50 MCG/ACT nasal spray Place 2 sprays into the nose daily.   polyethylene glycol-electrolytes (NULYTELY ) 420 g solution Take by mouth.   pravastatin  (PRAVACHOL ) 20 MG tablet TAKE 1 TABLET BY MOUTH EVERY DAY FOR CHOLESTEROL   promethazine -dextromethorphan (PROMETHAZINE -DM) 6.25-15 MG/5ML syrup Take 5 mLs by mouth 4 (four) times daily as needed.   Semaglutide ,  2 MG/DOSE, 8 MG/3ML SOPN Inject 2 mg as directed once a week.   spironolactone  (ALDACTONE ) 25 MG tablet TAKE 1 TABLET BY MOUTH EVERY DAY      EKGs/Labs/Other Studies Reviewed:    The following studies were reviewed today:  Cardiac Studies & Procedures   ______________________________________________________________________________________________     ECHOCARDIOGRAM  ECHOCARDIOGRAM COMPLETE 03/21/2024  Narrative ECHOCARDIOGRAM REPORT    Patient Name:   Theresa Marquez Date of Exam: 03/21/2024 Medical Rec #:  [id number]       Height:       64.0 in Accession #:    [id number]      Weight:       241.0 lb Date of Birth:  1966/05/27       BSA:          2.118 m Patient Age:    58 years        BP:           126/77 mmHg Patient Gender: F               HR:           87 bpm. Exam Location:  [place]  Procedure: 2D Echo, Cardiac Doppler, Color Doppler and Strain Analysis (Both Spectral and Color Flow Doppler were utilized during procedure).  Indications:    R06.9 DOE  History:        Patient has prior history of Echocardiogram examinations, most recent 12/02/2021. CHF, CAD, COPD and Pulmonary HTN, Signs/Symptoms:Dyspnea; Risk Factors:Current Smoker, Hypertension, Diabetes, Dyslipidemia and Sleep Apnea.  Sonographer:    Alan Greenhouse RDMS, RVT, RDCS Referring Phys: [id number] DELON JAYSON HOOVER   Sonographer Comments: Suboptimal parasternal window. Image acquisition challenging due to patient body habitus. IMPRESSIONS   1. Left ventricular ejection fraction, by estimation, is 60 to 65%. Left ventricular ejection fraction by 2D MOD biplane is 58.2 %. The left ventricle has normal function. The left ventricle has no regional wall motion abnormalities. Left ventricular diastolic parameters are consistent with Grade I diastolic dysfunction (impaired relaxation). The average left ventricular global longitudinal strain is -21.7 %. The global longitudinal strain is normal. 2. Right ventricular systolic function is normal. The right ventricular size is normal. 3. The mitral valve is normal in structure. No evidence of mitral valve regurgitation. No evidence of mitral stenosis. 4. The aortic valve is normal in structure. Aortic valve regurgitation is not visualized. No aortic stenosis is present. 5. The inferior vena cava is normal in size with greater than 50% respiratory variability, suggesting right atrial pressure of 3 mmHg.  Comparison(s): Echocardiogram done 12/02/21 showed an EF of 55-60%.  FINDINGS Left Ventricle: Left ventricular ejection fraction, by estimation, is 60 to 65%. Left ventricular ejection fraction by 2D MOD biplane is 58.2 %. The left ventricle has normal function. The left ventricle has no regional wall motion abnormalities. The average left ventricular global  longitudinal strain is -21.7 %. Strain was performed and the global longitudinal strain is normal. The left ventricular internal cavity size was normal in size. There is no left ventricular hypertrophy. Left ventricular diastolic parameters are consistent with Grade I diastolic dysfunction (impaired relaxation). Normal left ventricular filling pressure.  Right Ventricle: The right ventricular size is normal. No increase in right ventricular wall thickness. Right ventricular systolic function is normal.  Left Atrium: Left atrial size was normal in size.  Right Atrium: Right atrial size was normal in size.  Pericardium: There is no evidence of pericardial effusion.  Mitral Valve:  The mitral valve is normal in structure. No evidence of mitral valve regurgitation. No evidence of mitral valve stenosis.  Tricuspid Valve: The tricuspid valve is normal in structure. Tricuspid valve regurgitation is not demonstrated. No evidence of tricuspid stenosis.  Aortic Valve: The aortic valve is normal in structure. Aortic valve regurgitation is not visualized. No aortic stenosis is present. Aortic valve mean gradient measures 7.0 mmHg. Aortic valve peak gradient measures 12.2 mmHg. Aortic valve area, by VTI measures 2.19 cm.  Pulmonic Valve: The pulmonic valve was not well visualized. Pulmonic valve regurgitation is not visualized. No evidence of pulmonic stenosis.  Aorta: The aortic root is normal in size and structure, the aortic arch was not well visualized and the ascending aorta was not well visualized.  Venous: The inferior vena cava is normal in size with greater than 50% respiratory variability, suggesting right atrial pressure of 3 mmHg.  IAS/Shunts: No atrial level shunt detected by color flow Doppler.   LEFT VENTRICLE PLAX 2D                        Biplane EF (MOD) LVIDd:         4.90 cm         LV Biplane EF:   Left LVIDs:         3.10 cm                          ventricular LV PW:          1.00 cm                          ejection LV IVS:        0.90 cm                          fraction by LVOT diam:     2.10 cm                          2D MOD LV SV:         77                               biplane is LV SV Index:   36                               58.2 %. LVOT Area:     3.46 cm Diastology LV e' medial:    5.77 cm/s LV Volumes (MOD)               LV E/e' medial:  13.2 LV vol d, MOD    67.3 ml       LV e' lateral:   7.51 cm/s A2C:                           LV E/e' lateral: 10.1 LV vol d, MOD    53.7 ml A4C:                           2D Longitudinal LV vol s, MOD    27.1 ml       Strain A2C:  2D Strain GLS   -20.2 % LV vol s, MOD    23.9 ml       (A4C): A4C:                           2D Strain GLS   -23.7 % LV SV MOD A2C:   40.2 ml       (A3C): LV SV MOD A4C:   53.7 ml       2D Strain GLS   -21.3 % LV SV MOD BP:    35.4 ml       (A2C): 2D Strain GLS   -21.7 % Avg:  RIGHT VENTRICLE RV S prime:     12.80 cm/s TAPSE (M-mode): 2.6 cm  LEFT ATRIUM             Index        RIGHT ATRIUM           Index LA diam:        2.70 cm 1.27 cm/m   RA Area:     10.40 cm LA Vol (A2C):   15.6 ml 7.37 ml/m   RA Volume:   22.60 ml  10.67 ml/m LA Vol (A4C):   31.0 ml 14.64 ml/m LA Biplane Vol: 23.7 ml 11.19 ml/m AORTIC VALVE AV Area (Vmax):    2.22 cm AV Area (Vmean):   2.02 cm AV Area (VTI):     2.19 cm AV Vmax:           175.00 cm/s AV Vmean:          127.000 cm/s AV VTI:            0.352 m AV Peak Grad:      12.2 mmHg AV Mean Grad:      7.0 mmHg LVOT Vmax:         112.00 cm/s LVOT Vmean:        74.100 cm/s LVOT VTI:          0.223 m LVOT/AV VTI ratio: 0.63  AORTA Ao Root diam: 3.20 cm  MITRAL VALVE MV Area (PHT): 2.91 cm    SHUNTS MV Decel Time: 261 msec    Systemic VTI:  0.22 m MR Peak grad: 21.3 mmHg    Systemic Diam: 2.10 cm MR Vmax:      231.00 cm/s MV E velocity: 76.00 cm/s MV A velocity: 90.70 cm/s MV E/A ratio:  0.84  Redell Leiter MD Electronically signed by Redell Leiter MD Signature Date/Time: 03/21/2024/6:30:53 PM    Final    MONITORS  LONG TERM MONITOR-LIVE TELEMETRY (3-14 DAYS) 03/20/2024  Narrative Patch Wear Time:  11 days and 17 hours (2025-09-02T10:17:43-0400 to 2025-09-14T03:23:53-0400)  Patient had a min HR of 47 bpm, max HR of 167 bpm, and avg HR of 90 bpm. Predominant underlying rhythm was Sinus Rhythm. First Degree AV Block was present.  There were 4 symptomatic events all sinus rhythm and sinus tachycardia without ectopy.  There were no pauses 3 seconds or greater no episodes of second or third-degree AV nodal block.  12 brief runs of APCs/atrial tachycardia  occurred, the run with the fastest interval lasting 6 beats with a max rate of 167 bpm, the longest lasting 9.3 secs with an avg rate of 151 bpm.  There were no episodes of atrial fibrillation or flutter.  Isolated SVEs were rare (<1.0%), SVE Couplets were rare (<1.0%), and SVE Triplets were rare (<1.0%).  Isolated VEs  were rare (<1.0%), and no VE Couplets or VE Triplets were present.   CT SCANS  CT CORONARY MORPH W/CTA COR W/SCORE 03/20/2024  Addendum 03/25/2024  8:19 PM ADDENDUM REPORT: 03/25/2024 20:16  EXAM: OVER-READ INTERPRETATION  CT CHEST  The following report is an over-read performed by radiologist Dr. Suzen Dials of [facility] on 03/25/2024. This over-read does not include interpretation of cardiac or coronary anatomy or pathology. The coronary calcium  score/coronary CTA interpretation by the cardiologist is attached.  COMPARISON:  July 29, 2023  FINDINGS: Cardiovascular: There are no significant extracardiac vascular findings.  Mediastinum/Nodes: There are no enlarged lymph nodes within the visualized mediastinum.  Lungs/Pleura: There is no pleural effusion. The visualized lungs appear clear.  Upper abdomen: No significant findings in the visualized  upper abdomen.  Musculoskeletal/Chest wall: No chest wall mass or suspicious osseous findings within the visualized chest.  IMPRESSION: No significant extracardiac findings within the visualized chest.   Electronically Signed By: Suzen Dials M.D. On: 03/25/2024 20:16  Narrative CLINICAL DATA:  CP  EXAM: Cardiac/Coronary  CTA  TECHNIQUE: The patient was scanned on a GE Apex scanner.  FINDINGS: A 120 kV prospective scan was triggered in the descending thoracic aorta at 111 HU's. Axial non-contrast 3 mm slices were carried out through the heart. The data set was analyzed on a dedicated work station and scored using the Agatson method. Gantry rotation speed was 250 msecs and collimation was .6 mm. No beta blockade and 0.8 mg of sl NTG was given. The 3D data set was reconstructed at 47% of the R-R cycle. Diastolic phases were analyzed on a dedicated work station using MPR, MIP and VRT modes. The patient received 80 cc of contrast.  Aorta: Normal size.  No calcifications.  No dissection.  Aortic Valve:  Trileaflet.  No calcifications.  Coronary Arteries:  Normal coronary origin.  Right dominance.  RCA is a large dominant artery that gives rise to PDA and PLA. There is no plaque.  Left main is a large artery that gives rise to LAD and LCX arteries.  LAD is a large vessel that has few small plaques in its proximal and mid portion with mild stenosis of 25-49%. This artery gives rise to small D1 and large D2, D3.  LCX is a non-dominant artery that gives rise to one small OM1 and large OM2 branches. There is soft plaque in the proximal portion of the small OM1 with moderate stenosis of 50-69%.  Other findings:  Normal pulmonary vein drainage into the left atrium.  Normal left atrial appendage without a thrombus.  Normal size of the pulmonary artery.  IMPRESSION: 1. Coronary calcium  score of 155. This was 95 percentile for age and sex matched control.  2.  Normal coronary origin with right dominance.  3. CAD-RADS 3. Moderate stenosis. (50-69%) small OM1. Consider symptom-guided anti-ischemic pharmacotherapy as well as risk factor modification per guideline directed care.  Electronically Signed: By: Lamar Fitch M.D. On: 03/21/2024 16:58     ______________________________________________________________________________________________          Recent Labs: 07/14/2023: TSH 2.07 12/21/2023: ALT 24; Hemoglobin 14.6; Platelets 424.0 02/28/2024: BUN 12; Creatinine, Ser 0.92; Potassium 4.7; Sodium 136  Recent Lipid Panel    Component Value Date/Time   CHOL 223 (H) 07/14/2023 1436   TRIG 208.0 (H) 07/14/2023 1436   HDL 32.50 (L) 07/14/2023 1436   CHOLHDL 7 07/14/2023 1436   VLDL 41.6 (H) 07/14/2023 1436   LDLCALC 148 (H) 07/14/2023 1436  Physical Exam:    VS:  BP 122/78   Pulse 85   Ht 5' 4 (1.626 m)   Wt 236 lb 6.4 oz (107.2 kg)   LMP  (LMP Unknown)   SpO2 96%   BMI 40.58 kg/m     Wt Readings from Last 3 Encounters:  05/03/24 236 lb 6.4 oz (107.2 kg)  03/16/24 241 lb (109.3 kg)  02/28/24 239 lb 3.2 oz (108.5 kg)     GEN:  Well nourished, well developed in no acute distress marked obesity HEENT: Normal NECK: No JVD; No carotid bruits LYMPHATICS: No lymphadenopathy CARDIAC: RRR, no murmurs, rubs, gallops RESPIRATORY:  Clear to auscultation without rales, wheezing or rhonchi  ABDOMEN: Soft, non-tender, non-distended MUSCULOSKELETAL:  No edema; No deformity  SKIN: Warm and dry NEUROLOGIC:  Alert and oriented x 3 PSYCHIATRIC:  Normal affect    Signed, Redell Leiter, MD  05/03/2024 2:12 PM    [facility]

## 2024-05-03 NOTE — Patient Instructions (Signed)
 Medication Instructions:  Your physician has recommended you make the following change in your medication:   START: Aspirin 81 mg daily  *If you need a refill on your cardiac medications before your next appointment, please call your pharmacy*  Lab Work: Your physician recommends that you return for lab work in:   Labs today: Lipids, Apo B  If you have labs (blood work) drawn today and your tests are completely normal, you will receive your results only by: MyChart Message (if you have MyChart) OR A paper copy in the mail If you have any lab test that is abnormal or we need to change your treatment, we will call you to review the results.  Testing/Procedures: None  Follow-Up: At [facility], you and your health needs are our priority.  As part of our continuing mission to provide you with exceptional heart care, our providers are all part of one team.  This team includes your primary Cardiologist (physician) and Advanced Practice Providers or APPs (Physician Assistants and Nurse Practitioners) who all work together to provide you with the care you need, when you need it.  Your next appointment:   1 year(s)  Provider:   Redell Leiter, MD    We recommend signing up for the patient portal called MyChart.  Sign up information is provided on this After Visit Summary.  MyChart is used to connect with patients for Virtual Visits (Telemedicine).  Patients are able to view lab/test results, encounter notes, upcoming appointments, etc.  Non-urgent messages can be sent to your provider as well.   To learn more about what you can do with MyChart, go to [email or web address].   Other Instructions None

## 2024-05-09 ENCOUNTER — Ambulatory Visit (INDEPENDENT_AMBULATORY_CARE_PROVIDER_SITE_OTHER): Admitting: Neurology

## 2024-05-09 DIAGNOSIS — G4710 Hypersomnia, unspecified: Secondary | ICD-10-CM

## 2024-05-09 DIAGNOSIS — G4714 Hypersomnia due to medical condition: Secondary | ICD-10-CM

## 2024-05-09 DIAGNOSIS — G4711 Idiopathic hypersomnia with long sleep time: Secondary | ICD-10-CM

## 2024-05-09 DIAGNOSIS — J329 Chronic sinusitis, unspecified: Secondary | ICD-10-CM

## 2024-05-09 DIAGNOSIS — R55 Syncope and collapse: Secondary | ICD-10-CM
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
## 2024-05-13 MED ORDER — ALPRAZOLAM 0.5 MG PO TABS: 0.5000 mg | ORAL_TABLET | Freq: Every evening | ORAL | 0 refills | Status: AC | PRN
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
## 2024-05-14 NOTE — Telephone Encounter (Signed)
-----   Message from Lydia Dohmeier sent at 05/13/2024 10:49 PM EST ----- The HST revealed a diagnosis of : Severe sleep apnea ( OSA) and severe sleep hypoxia. I recommend in -lab titration to PAP and , if needed, oxygen.  Patient will be offered a sleep aid .  ----- Message ----- From: Chalice Saunas, MD Sent: 05/13/2024  10:39 PM EST To: Saunas Chalice, MD

## 2024-05-14 NOTE — Telephone Encounter (Signed)
 Spoke with patient and discussed sleep study results as noted below. The patient said she cannot do an in-lab study because she has an adult son with schizophrenia whom she cannot leave alone. She states she does not have anyone who can stay at home with him the night of her sleep study. I told her I would let Dr Chalice know and call her back.

## 2024-05-17 ENCOUNTER — Other Ambulatory Visit: Payer: Self-pay | Admitting: Neurology

## 2024-05-17 DIAGNOSIS — Z720 Tobacco use: Secondary | ICD-10-CM

## 2024-05-17 DIAGNOSIS — G4733 Obstructive sleep apnea (adult) (pediatric): Secondary | ICD-10-CM

## 2024-05-17 DIAGNOSIS — R600 Localized edema: Secondary | ICD-10-CM

## 2024-05-17 NOTE — Progress Notes (Signed)
 I spoke with the patient and discussed message from Dr Chalice as noted below. The patient said she will try cpap but she feels sure she won't be able to tolerate it. She said if it chokes her, she will not wear it. She feels that not everyone fits into a cookie cutter box and she is not gonna choke myself to death wearing cpap. She is seeing ENT for enlarged tonsils, sinus drainage, coughing. Patient wants to be setup with DME in [facility] if possible. Referral sent to [facility]). Discussed insurance compliance requirements which includes using machine at least 4 hours at night and being seen between 30 and 90 days after setup. Pt was scheduled for 07/24/24 at 1245 pm, arrive 1230. She thanked me for the call.

## 2024-05-22 ENCOUNTER — Telehealth: Payer: Self-pay | Admitting: Neurology

## 2024-05-22 NOTE — Telephone Encounter (Signed)
 CPAP [facility] pending

## 2024-05-28 ENCOUNTER — Other Ambulatory Visit: Payer: Self-pay | Admitting: Family Medicine

## 2024-05-28 DIAGNOSIS — G8929 Other chronic pain: Secondary | ICD-10-CM

## 2024-05-29 NOTE — Telephone Encounter (Signed)
 Checked status on the portal it is still pending

## 2024-05-31 NOTE — Telephone Encounter (Signed)
 CPAP [facility] shara: [id number] (exp. 05/22/24 to 08/16/24) *bed 2 & bring Xanax 

## 2024-06-18 NOTE — Telephone Encounter (Signed)
 FYI spoke with patient she can not do the sleep study at this time due to transportation

## 2024-06-27 ENCOUNTER — Other Ambulatory Visit: Payer: Self-pay | Admitting: Family Medicine

## 2024-06-27 ENCOUNTER — Telehealth: Payer: Self-pay

## 2024-06-27 ENCOUNTER — Other Ambulatory Visit (HOSPITAL_COMMUNITY): Payer: Self-pay

## 2024-06-27 DIAGNOSIS — I700 Atherosclerosis of aorta: Secondary | ICD-10-CM

## 2024-06-27 DIAGNOSIS — I2510 Atherosclerotic heart disease of native coronary artery without angina pectoris: Secondary | ICD-10-CM

## 2024-06-27 NOTE — Telephone Encounter (Signed)
 Pharmacy Patient Advocate Encounter   Received notification from Onbase that prior authorization for Ozempic  8 is required/requested.   Insurance verification completed.   The patient is insured through [facility].   Per test claim: PA required; PA submitted to above mentioned insurance via Latent Key/confirmation #/EOC [id number] Status is pending

## 2024-06-29 ENCOUNTER — Other Ambulatory Visit (HOSPITAL_COMMUNITY): Payer: Self-pay

## 2024-06-29 NOTE — Telephone Encounter (Signed)
 Pharmacy Patient Advocate Encounter  Received notification from [employer] that Prior Authorization for Ozempic  8 has been APPROVED from 06/27/24 to 06/27/25. Unable to obtain price due to refill too soon rejection, last fill date 06/13/24 next available fill date1/7/26   PA #/Case ID/Reference #: # [id number]

## 2024-07-05 ENCOUNTER — Ambulatory Visit: Payer: Self-pay | Admitting: Neurology

## 2024-07-06 ENCOUNTER — Encounter

## 2024-07-24 ENCOUNTER — Ambulatory Visit: Admitting: Neurology

## 2024-07-25 ENCOUNTER — Other Ambulatory Visit (INDEPENDENT_AMBULATORY_CARE_PROVIDER_SITE_OTHER): Payer: Self-pay | Admitting: Otolaryngology
